# Patient Record
Sex: Female | Born: 1950 | Race: White
Health system: Southern US, Community
[De-identification: ages and names within clinical notes are randomized; demographics above are authoritative.]

## PROBLEM LIST (undated history)

## (undated) DIAGNOSIS — E079 Disorder of thyroid, unspecified: Secondary | ICD-10-CM

## (undated) DIAGNOSIS — J45909 Unspecified asthma, uncomplicated: Secondary | ICD-10-CM

## (undated) DIAGNOSIS — E785 Hyperlipidemia, unspecified: Secondary | ICD-10-CM

## (undated) DIAGNOSIS — N200 Calculus of kidney: Secondary | ICD-10-CM

## (undated) DIAGNOSIS — G629 Polyneuropathy, unspecified: Secondary | ICD-10-CM

## (undated) DIAGNOSIS — I10 Essential (primary) hypertension: Secondary | ICD-10-CM

## (undated) DIAGNOSIS — M799 Soft tissue disorder, unspecified: Secondary | ICD-10-CM

## (undated) DIAGNOSIS — F40240 Claustrophobia: Secondary | ICD-10-CM

## (undated) DIAGNOSIS — H9190 Unspecified hearing loss, unspecified ear: Secondary | ICD-10-CM

## (undated) DIAGNOSIS — D649 Anemia, unspecified: Secondary | ICD-10-CM

## (undated) DIAGNOSIS — H539 Unspecified visual disturbance: Secondary | ICD-10-CM

## (undated) DIAGNOSIS — Z5189 Encounter for other specified aftercare: Secondary | ICD-10-CM

## (undated) DIAGNOSIS — H269 Unspecified cataract: Secondary | ICD-10-CM

## (undated) DIAGNOSIS — N289 Disorder of kidney and ureter, unspecified: Secondary | ICD-10-CM

## (undated) DIAGNOSIS — T7840XA Allergy, unspecified, initial encounter: Secondary | ICD-10-CM

## (undated) DIAGNOSIS — E039 Hypothyroidism, unspecified: Secondary | ICD-10-CM

## (undated) DIAGNOSIS — K219 Gastro-esophageal reflux disease without esophagitis: Secondary | ICD-10-CM

## (undated) DIAGNOSIS — R011 Cardiac murmur, unspecified: Secondary | ICD-10-CM

## (undated) HISTORY — PX: TONSILLECTOMY: SUR1361

## (undated) HISTORY — DX: Unspecified hearing loss, unspecified ear: H91.90

## (undated) HISTORY — DX: Allergy, unspecified, initial encounter: T78.40XA

## (undated) HISTORY — DX: Calculus of kidney: N20.0

## (undated) HISTORY — PX: EYE SURGERY: SHX253

## (undated) HISTORY — PX: DILATION AND CURETTAGE OF UTERUS: SHX78

## (undated) HISTORY — DX: Essential (primary) hypertension: I10

## (undated) HISTORY — DX: Hyperlipidemia, unspecified: E78.5

## (undated) HISTORY — PX: TONSILECTOMY, ADENOIDECTOMY, BILATERAL MYRINGOTOMY AND TUBES: SHX2538

## (undated) HISTORY — DX: Disorder of thyroid, unspecified: E07.9

## (undated) HISTORY — DX: Anemia, unspecified: D64.9

---

## 2001-11-06 ENCOUNTER — Ambulatory Visit: Admission: RE | Admit: 2001-11-06 | Payer: Self-pay | Source: Ambulatory Visit | Admitting: Gastroenterology

## 2003-03-06 ENCOUNTER — Ambulatory Visit
Admit: 2003-03-06 | Disposition: A | Discharge status: Home / Self Care | Payer: Self-pay | Source: Ambulatory Visit | Admitting: Otolaryngology

## 2006-05-09 ENCOUNTER — Ambulatory Visit
Admit: 2006-05-09 | Disposition: A | Discharge status: Home / Self Care | Payer: Self-pay | Source: Ambulatory Visit | Admitting: Family Medicine

## 2007-07-11 ENCOUNTER — Ambulatory Visit: Admission: RE | Admit: 2007-07-11 | Payer: Self-pay | Source: Ambulatory Visit | Admitting: Urology

## 2007-07-11 LAB — POTASSIUM WHOLE BLOOD: Whole Blood Potassium: 4.2 mEQ/L (ref 3.6–5.0)

## 2007-08-08 HISTORY — PX: COLONOSCOPY, DIAGNOSTIC (SCREENING): SHX174

## 2008-11-16 HISTORY — PX: CATARACT EXTRACTION: SUR2

## 2009-11-03 ENCOUNTER — Emergency Department: Admit: 2009-11-03 | Payer: Self-pay | Source: Emergency Department | Admitting: Emergency Medicine

## 2011-06-28 LAB — ECG 12-LEAD
Atrial Rate: 73 {beats}/min
P Axis: 47 degrees
P-R Interval: 144 ms
Q-T Interval: 380 ms
QRS Duration: 74 ms
QTC Calculation (Bezet): 418 ms
R Axis: 12 degrees
T Axis: 39 degrees
Ventricular Rate: 73 {beats}/min

## 2011-07-06 NOTE — Op Note (Unsigned)
ROOM NUMBER:                           OPS OPS [id number]            SURGEON:                                Dolphus Jenny, MD            DATE:                                   07/11/2007                  PREOPERATIVE DIAGNOSIS:  Right ureteral stone.            POSTOPERATIVE DIAGNOSIS:  Probable passed ureteral stone.            OPERATION:  Cystoscopy, fluoroscopy, right ureteroscopy, stone extraction.            ANESTHESIA:            COMPLICATIONS:  None.            LINES, DRAINS:  None.            INDICATIONS FOR PROCEDURE:  The patient was a 60 year old white female with      a 4-mm stone that started in the mid right ureter and has not progressed.      She has been consented for right ureteroscopic stone extraction.  A signed      informed consent is on the chart.            PROCEDURE:  Patient was taken to the cystoscopy suite at [facility]      on Thursday, 07/11/2007.  Once anesthetized with general LMA and in a      lithotomy position, cystoscopy was performed which revealed essentially a      normal bladder.  A guidewire was passed up into the renal pelvis.  The      semirigid ACMI semirigid scope was passed up, in fact all the way up to the      UP junction, and there was just evidence of small fragments which were      grasped.            A stent was deemed not necessary.  She was extubated in cystoscopy suite,      given 3 days of Macrobid, and I will see her again as necessary.                                    ______________________________     Date Signed: __________      Dolphus Jenny, MD            SWF:[id number]      Dictated:    07/11/2007 12:56 P      Transcribed: 07/11/2007  5:35 P      Job Number: [id number]      Document Number: [id number]            CC:  Dolphus Jenny, MD

## 2011-12-14 DIAGNOSIS — I10 Essential (primary) hypertension: Secondary | ICD-10-CM | POA: Insufficient documentation

## 2011-12-17 DIAGNOSIS — K2270 Barrett's esophagus without dysplasia: Secondary | ICD-10-CM | POA: Insufficient documentation

## 2012-05-15 DIAGNOSIS — N200 Calculus of kidney: Secondary | ICD-10-CM | POA: Insufficient documentation

## 2013-01-15 ENCOUNTER — Other Ambulatory Visit: Payer: Self-pay

## 2013-01-15 DIAGNOSIS — Z1231 Encounter for screening mammogram for malignant neoplasm of breast: Secondary | ICD-10-CM

## 2013-01-31 ENCOUNTER — Ambulatory Visit
Admission: RE | Admit: 2013-01-31 | Discharge: 2013-01-31 | Disposition: A | Discharge status: Home / Self Care | Payer: Federal, State, Local not specified - PPO | Source: Ambulatory Visit

## 2013-01-31 DIAGNOSIS — Z1231 Encounter for screening mammogram for malignant neoplasm of breast: Secondary | ICD-10-CM

## 2013-12-25 DIAGNOSIS — E785 Hyperlipidemia, unspecified: Secondary | ICD-10-CM | POA: Insufficient documentation

## 2014-02-04 ENCOUNTER — Other Ambulatory Visit: Payer: Self-pay

## 2014-02-04 DIAGNOSIS — Z1231 Encounter for screening mammogram for malignant neoplasm of breast: Secondary | ICD-10-CM

## 2014-02-20 ENCOUNTER — Encounter (INDEPENDENT_AMBULATORY_CARE_PROVIDER_SITE_OTHER): Payer: Self-pay

## 2014-02-20 ENCOUNTER — Ambulatory Visit
Admission: RE | Admit: 2014-02-20 | Discharge: 2014-02-20 | Disposition: A | Discharge status: Home / Self Care | Payer: Federal, State, Local not specified - PPO | Source: Ambulatory Visit

## 2014-02-20 DIAGNOSIS — Z1231 Encounter for screening mammogram for malignant neoplasm of breast: Secondary | ICD-10-CM

## 2014-02-24 ENCOUNTER — Other Ambulatory Visit: Payer: Self-pay | Admitting: Family Medicine

## 2014-02-24 DIAGNOSIS — R928 Other abnormal and inconclusive findings on diagnostic imaging of breast: Secondary | ICD-10-CM

## 2014-02-25 ENCOUNTER — Ambulatory Visit
Admission: RE | Admit: 2014-02-25 | Discharge: 2014-02-25 | Disposition: A | Discharge status: Home / Self Care | Payer: Federal, State, Local not specified - PPO | Source: Ambulatory Visit | Attending: Family Medicine | Admitting: Family Medicine

## 2014-02-25 ENCOUNTER — Other Ambulatory Visit: Payer: Self-pay | Admitting: Family Medicine

## 2014-02-25 DIAGNOSIS — R928 Other abnormal and inconclusive findings on diagnostic imaging of breast: Secondary | ICD-10-CM

## 2014-04-16 DIAGNOSIS — R7301 Impaired fasting glucose: Secondary | ICD-10-CM | POA: Insufficient documentation

## 2014-04-17 DIAGNOSIS — D34 Benign neoplasm of thyroid gland: Secondary | ICD-10-CM | POA: Insufficient documentation

## 2014-04-17 DIAGNOSIS — N1831 Chronic kidney disease, stage 3a: Secondary | ICD-10-CM | POA: Insufficient documentation

## 2014-04-17 DIAGNOSIS — E039 Hypothyroidism, unspecified: Secondary | ICD-10-CM | POA: Insufficient documentation

## 2014-07-24 ENCOUNTER — Other Ambulatory Visit: Payer: Self-pay | Admitting: Family Medicine

## 2014-07-24 DIAGNOSIS — N63 Unspecified lump in unspecified breast: Secondary | ICD-10-CM

## 2014-08-27 ENCOUNTER — Ambulatory Visit
Admission: RE | Admit: 2014-08-27 | Discharge: 2014-08-27 | Disposition: A | Discharge status: Home / Self Care | Payer: Federal, State, Local not specified - PPO | Source: Ambulatory Visit | Attending: Family Medicine | Admitting: Family Medicine

## 2014-08-27 DIAGNOSIS — N63 Unspecified lump in unspecified breast: Secondary | ICD-10-CM

## 2015-01-12 ENCOUNTER — Encounter (INDEPENDENT_AMBULATORY_CARE_PROVIDER_SITE_OTHER): Payer: Self-pay

## 2015-01-14 ENCOUNTER — Ambulatory Visit (INDEPENDENT_AMBULATORY_CARE_PROVIDER_SITE_OTHER): Payer: BLUE CROSS/BLUE SHIELD | Admitting: Cardiovascular Disease

## 2015-01-14 DIAGNOSIS — I371 Nonrheumatic pulmonary valve insufficiency: Secondary | ICD-10-CM

## 2015-01-14 DIAGNOSIS — I340 Nonrheumatic mitral (valve) insufficiency: Secondary | ICD-10-CM

## 2015-01-14 DIAGNOSIS — I358 Other nonrheumatic aortic valve disorders: Secondary | ICD-10-CM

## 2015-01-14 NOTE — Procedures (Signed)
ECHOCARDIOGRAM REPORT    Name: Victoria Fields Age: 64 y.o. Gender: female Date: 01/14/2015   Clinical history: Mitral Regurgitation Height:  70in Weight: 200 lb   Physician: Jonny Ruiz T. Alois Cliche, M.D. cc: Nobu, Melton Alar, MD   Quality of study: Good Tech: LM BP 138/78 DVD Number: Dr Carolann Littler #[id number]     M-MODE AND 2-D DIMENSIONS  Patient values  Normal Values  Patient Values  Normal Values   Aortic Root: 30 mm 20-37 mm RVIDd 26 mm <59mm   Aortic Leaflet Separation: 15 mm 15-26 mm IVSDd 12 mm 6-11 mm   Left Atrium: 36 mm 18-40 mm LVPWDd 12 mm 6-11 mm   IVC  mm 22mm LVIDd 41 mm 37-56 mm       LVIDs 25 mm 20-40 mm   TDI 5.7  <10 FS% 39 % 28-45%   Ascending  Aorta: 30 mm <37 mm EF%  65  % 50-75%     DOPPLER/HEMODYNAMIC ANALYSIS  MV Inflow:   normal Decel Time:     Estimated RVSP: 20mm Hg TR Vmax 2.70m/sec RAP estim: 5 mm Hg     Valve Pk Vel. Normal (m/sec) Regurgitation Area (cm2) Mn Grad. Pk Grad. LVOT   Aortic  1.9 1-1.7  none  1.4  8.4  15.3     Mitral  0.9 0.6-1.3  none           Tricuspid  0.7 0.3-0.7  trace           Pulmonic  1.2 0.6-1.0  mild           LVOT  0.9 0.7-1.7                 FINAL IMPRESSIONS:  1. Left Ventricle: Normal left ventricular size, borderline hypertrophy and function with an EF of 60-65% with no regional wall motion abnormalities.  2. Right Ventricle: Normal size and function.   3. Pulmonary Arteries: Normal pulmonary artery pressures with an estimated RVSP of 20 mm  Hg.  4. Left Atrium: normal size   5. Right Atrium: normal size  6. Aortic Valve: tr.  There is mild sclerosis without stenosis.  No regurgitation  7. Mitral Valve: normal structure and function with no stenosis or regurgitation. flow velocities are normal   8. Tricuspid Valve: normal in morphology with trace regurgitation.  9. Pulmonic Valve:  mildI by Doppler.  10. Pericardium: no pericardial effusion.   11. IVC: normal size. Normal respiratory changes.  12. Aorta: normal aortic root size, aortic arch normal size.  13. Diastolic  Function: Tissue Doppler velocities and mitral inflow pattern  With no diastolic dysfunction.  .  Conclusions:  Mild left ventricular hypertrophy with preserved systolic function.  Trace tricuspid regurgitation.    Mild pulmonic insufficiency.  No other significant abnormalities noted  John T. Alois Cliche, M.D., [facility]

## 2015-02-17 HISTORY — PX: COLONOSCOPY, DIAGNOSTIC (SCREENING): SHX174

## 2015-02-22 ENCOUNTER — Encounter (INDEPENDENT_AMBULATORY_CARE_PROVIDER_SITE_OTHER): Payer: Self-pay

## 2015-02-22 ENCOUNTER — Ambulatory Visit (INDEPENDENT_AMBULATORY_CARE_PROVIDER_SITE_OTHER): Payer: BLUE CROSS/BLUE SHIELD | Admitting: Cardiovascular Disease

## 2015-02-22 DIAGNOSIS — I10 Essential (primary) hypertension: Secondary | ICD-10-CM

## 2015-02-22 NOTE — Procedures (Signed)
--------------------------------------------------------------------------------------------    Cardiology Outpatient Exercise Stress Test   --------------------------------------------------------------------------------------------    Name:  Victoria Fields, Victoria Fields       DOB: 05-02-1951        Test Date:  02/22/2015         Referring Physician: Phillips Odor, MD    Performing Physician: Delaney Meigs,  MD [facility]    Indications/symptoms:  Positive family history (MI)  Occasional pain in left arm when exercising  Risks for coronary artery disease      Current exercise routine:  Uses treadmill 1-3 times per week       Resting ECG: Normal sinus rhythm, no significant ST changes.    Heart Rate:     Rest = 82bpm  Maximum = 150bpm (95% MPHR).    Blood Pressure:  Rest = 140/82  Maximum = 180/70    Patient Symptoms:  no significant symptoms    Standard Bruce protocol exercise time:  8:30 minutes    Exercise workload:  10.2 METS    Reason for ending:  achieved target heart rate and shortness of breath    Ischemic ST changes at peak exercise:  no    Arrhythmias: none    CONCLUSION:     1. Negative EKG response to exercise stress at heart rate achieved.  2. Normal blood pressure response to exercise   3. average exercise capacity for age and gender.  The patient has good exercise tolerance.  She had no discomfort during the stress test.  This puts the patient in a good prognostic category.  This is a negative stress test.  No further cardiac workup is indicated      Signed by: Delaney Meigs, MD [facility]

## 2015-03-08 ENCOUNTER — Other Ambulatory Visit: Payer: Self-pay

## 2015-03-08 DIAGNOSIS — Z1231 Encounter for screening mammogram for malignant neoplasm of breast: Secondary | ICD-10-CM

## 2015-04-08 ENCOUNTER — Ambulatory Visit
Admission: RE | Admit: 2015-04-08 | Discharge: 2015-04-08 | Disposition: A | Discharge status: Home / Self Care | Payer: Federal, State, Local not specified - PPO | Source: Ambulatory Visit

## 2015-04-08 DIAGNOSIS — Z1231 Encounter for screening mammogram for malignant neoplasm of breast: Secondary | ICD-10-CM

## 2016-04-06 ENCOUNTER — Other Ambulatory Visit: Payer: Self-pay | Admitting: Family Medicine

## 2016-04-06 DIAGNOSIS — Z1231 Encounter for screening mammogram for malignant neoplasm of breast: Secondary | ICD-10-CM

## 2016-05-08 ENCOUNTER — Ambulatory Visit
Admission: RE | Admit: 2016-05-08 | Discharge: 2016-05-08 | Disposition: A | Discharge status: Home / Self Care | Payer: Federal, State, Local not specified - PPO | Source: Ambulatory Visit | Attending: Family Medicine | Admitting: Family Medicine

## 2016-05-08 DIAGNOSIS — Z1231 Encounter for screening mammogram for malignant neoplasm of breast: Secondary | ICD-10-CM

## 2017-03-05 DIAGNOSIS — Z124 Encounter for screening for malignant neoplasm of cervix: Secondary | ICD-10-CM

## 2017-03-05 HISTORY — DX: Encounter for screening for malignant neoplasm of cervix: Z12.4

## 2017-05-16 ENCOUNTER — Other Ambulatory Visit: Payer: Self-pay | Admitting: Family Medicine

## 2017-05-16 DIAGNOSIS — Z1231 Encounter for screening mammogram for malignant neoplasm of breast: Secondary | ICD-10-CM

## 2017-05-16 DIAGNOSIS — M8580 Other specified disorders of bone density and structure, unspecified site: Secondary | ICD-10-CM

## 2017-06-13 ENCOUNTER — Ambulatory Visit
Admission: RE | Admit: 2017-06-13 | Discharge: 2017-06-13 | Disposition: A | Discharge status: Home / Self Care | Payer: Federal, State, Local not specified - PPO | Source: Ambulatory Visit | Attending: Family Medicine | Admitting: Family Medicine

## 2017-06-13 DIAGNOSIS — M8580 Other specified disorders of bone density and structure, unspecified site: Secondary | ICD-10-CM

## 2017-06-13 DIAGNOSIS — Z1231 Encounter for screening mammogram for malignant neoplasm of breast: Secondary | ICD-10-CM

## 2017-06-14 ENCOUNTER — Other Ambulatory Visit: Payer: Self-pay | Admitting: Family Medicine

## 2017-06-14 DIAGNOSIS — R928 Other abnormal and inconclusive findings on diagnostic imaging of breast: Secondary | ICD-10-CM

## 2017-06-20 ENCOUNTER — Ambulatory Visit
Admission: RE | Admit: 2017-06-20 | Discharge: 2017-06-20 | Disposition: A | Discharge status: Home / Self Care | Payer: Federal, State, Local not specified - PPO | Source: Ambulatory Visit | Attending: Family Medicine | Admitting: Family Medicine

## 2017-06-20 ENCOUNTER — Ambulatory Visit: Payer: Federal, State, Local not specified - PPO

## 2017-06-20 DIAGNOSIS — R928 Other abnormal and inconclusive findings on diagnostic imaging of breast: Secondary | ICD-10-CM

## 2017-10-01 ENCOUNTER — Other Ambulatory Visit (INDEPENDENT_AMBULATORY_CARE_PROVIDER_SITE_OTHER): Payer: Self-pay | Admitting: Family Medicine

## 2017-11-09 ENCOUNTER — Other Ambulatory Visit (INDEPENDENT_AMBULATORY_CARE_PROVIDER_SITE_OTHER): Payer: Self-pay | Admitting: Family Medicine

## 2018-01-07 ENCOUNTER — Other Ambulatory Visit (INDEPENDENT_AMBULATORY_CARE_PROVIDER_SITE_OTHER): Payer: Self-pay | Admitting: Family Medicine

## 2018-01-09 ENCOUNTER — Other Ambulatory Visit: Payer: Self-pay | Admitting: Family Medicine

## 2018-04-22 DIAGNOSIS — G4733 Obstructive sleep apnea (adult) (pediatric): Secondary | ICD-10-CM | POA: Insufficient documentation

## 2018-04-22 DIAGNOSIS — Q8781 Alport syndrome: Secondary | ICD-10-CM | POA: Insufficient documentation

## 2018-04-24 HISTORY — PX: UPPER GASTROINTESTINAL ENDOSCOPY: SHX188

## 2018-05-16 DIAGNOSIS — J309 Allergic rhinitis, unspecified: Secondary | ICD-10-CM | POA: Insufficient documentation

## 2018-05-21 ENCOUNTER — Other Ambulatory Visit: Payer: Self-pay | Admitting: Family Medicine

## 2018-05-21 DIAGNOSIS — Z1231 Encounter for screening mammogram for malignant neoplasm of breast: Secondary | ICD-10-CM

## 2018-07-04 ENCOUNTER — Ambulatory Visit
Admission: RE | Admit: 2018-07-04 | Discharge: 2018-07-04 | Disposition: A | Discharge status: Home / Self Care | Payer: Federal, State, Local not specified - PPO | Source: Ambulatory Visit | Attending: Family Medicine | Admitting: Family Medicine

## 2018-07-04 DIAGNOSIS — Z1231 Encounter for screening mammogram for malignant neoplasm of breast: Secondary | ICD-10-CM

## 2018-07-05 ENCOUNTER — Other Ambulatory Visit: Payer: Self-pay | Admitting: Family Medicine

## 2018-07-05 DIAGNOSIS — R928 Other abnormal and inconclusive findings on diagnostic imaging of breast: Secondary | ICD-10-CM

## 2018-07-11 ENCOUNTER — Ambulatory Visit: Payer: Federal, State, Local not specified - PPO

## 2018-07-11 ENCOUNTER — Ambulatory Visit
Admission: RE | Admit: 2018-07-11 | Discharge: 2018-07-11 | Disposition: A | Discharge status: Home / Self Care | Payer: Federal, State, Local not specified - PPO | Source: Ambulatory Visit | Attending: Family Medicine | Admitting: Family Medicine

## 2018-07-11 DIAGNOSIS — R928 Other abnormal and inconclusive findings on diagnostic imaging of breast: Secondary | ICD-10-CM

## 2018-09-09 DIAGNOSIS — E669 Obesity, unspecified: Secondary | ICD-10-CM | POA: Insufficient documentation

## 2018-09-09 DIAGNOSIS — M8580 Other specified disorders of bone density and structure, unspecified site: Secondary | ICD-10-CM | POA: Insufficient documentation

## 2018-10-31 ENCOUNTER — Other Ambulatory Visit (INDEPENDENT_AMBULATORY_CARE_PROVIDER_SITE_OTHER): Payer: Self-pay | Admitting: Family Medicine

## 2019-03-05 LAB — COMPREHENSIVE METABOLIC PANEL
ALT: 33 IU/L — ABNORMAL HIGH (ref 0–32)
AST (SGOT): 31 IU/L (ref 0–40)
Albumin/Globulin Ratio: 2.3 — ABNORMAL HIGH (ref 1.2–2.2)
Albumin: 4.5 g/dL (ref 3.6–4.8)
Alkaline Phosphatase: 88 IU/L (ref 39–117)
BUN / Creatinine Ratio: 15 (ref 12–28)
BUN: 24 mg/dL (ref 8–27)
Bilirubin, Total: 0.3 mg/dL (ref 0.0–1.2)
CO2: 16 mmol/L — ABNORMAL LOW (ref 20–29)
Calcium: 10.1 mg/dL (ref 8.7–10.3)
Chloride: 106 mmol/L (ref 96–106)
Creatinine: 1.65 mg/dL — ABNORMAL HIGH (ref 0.57–1.00)
EGFR: 32 mL/min/{1.73_m2} — ABNORMAL LOW (ref >59)
EGFR: 37 mL/min/{1.73_m2} — ABNORMAL LOW (ref >59)
Globulin, Total: 2 g/dL (ref 1.5–4.5)
Glucose: 101 mg/dL — ABNORMAL HIGH (ref 65–99)
Potassium: 5.1 mmol/L (ref 3.5–5.2)
Protein, Total: 6.5 g/dL (ref 6.0–8.5)
Sodium: 143 mmol/L (ref 134–144)

## 2019-03-05 LAB — CBC AND DIFFERENTIAL
Baso(Absolute): 0 10*3/uL (ref 0.0–0.2)
Basos: 1 %
Eos: 5 %
Eosinophils Absolute: 0.4 10*3/uL (ref 0.0–0.4)
Hematocrit: 36.9 % (ref 34.0–46.6)
Hemoglobin: 12.4 g/dL (ref 11.1–15.9)
Immature Granulocytes Absolute: 0 10*3/uL (ref 0.0–0.1)
Immature Granulocytes: 0 %
Lymphocytes Absolute: 2.9 10*3/uL (ref 0.7–3.1)
Lymphocytes: 35 %
MCH: 30 pg (ref 26.6–33.0)
MCHC: 33.6 g/dL (ref 31.5–35.7)
MCV: 89 fL (ref 79–97)
Monocytes Absolute: 0.8 10*3/uL (ref 0.1–0.9)
Monocytes: 9 %
Neutrophils Absolute: 4.2 10*3/uL (ref 1.4–7.0)
Neutrophils: 50 %
Platelets: 255 10*3/uL (ref 150–379)
RBC: 4.14 x10E6/uL (ref 3.77–5.28)
RDW: 14.1 % (ref 12.3–15.4)
WBC: 8.4 10*3/uL (ref 3.4–10.8)

## 2019-03-05 LAB — BASIC METABOLIC PANEL
BUN / Creatinine Ratio: 22 (ref 12–28)
BUN / Creatinine Ratio: 12 (ref 12–28)
BUN: 35 mg/dL — ABNORMAL HIGH (ref 8–27)
BUN: 21 mg/dL (ref 8–27)
CO2: 20 mmol/L (ref 20–29)
CO2: 21 mmol/L (ref 20–29)
Calcium: 10.1 mg/dL (ref 8.7–10.3)
Calcium: 9.6 mg/dL (ref 8.7–10.3)
Chloride: 106 mmol/L (ref 96–106)
Chloride: 103 mmol/L (ref 96–106)
Creatinine: 1.58 mg/dL — ABNORMAL HIGH (ref 0.57–1.00)
Creatinine: 1.77 mg/dL — ABNORMAL HIGH (ref 0.57–1.00)
EGFR: 34 mL/min/{1.73_m2} — ABNORMAL LOW (ref >59)
EGFR: 39 mL/min/{1.73_m2} — ABNORMAL LOW (ref >59)
EGFR: 30 mL/min/{1.73_m2} — ABNORMAL LOW (ref >59)
EGFR: 34 mL/min/{1.73_m2} — ABNORMAL LOW (ref >59)
Glucose: 100 mg/dL — ABNORMAL HIGH (ref 65–99)
Glucose: 107 mg/dL — ABNORMAL HIGH (ref 65–99)
Potassium: 5.4 mmol/L — ABNORMAL HIGH (ref 3.5–5.2)
Potassium: 5.6 mmol/L — ABNORMAL HIGH (ref 3.5–5.2)
Sodium: 141 mmol/L (ref 134–144)
Sodium: 140 mmol/L (ref 134–144)

## 2019-03-05 LAB — LIPID PANEL, WITHOUT TOTAL CHOLESTEROL/HDL RATIO, SERUM
Cholesterol: 185 mg/dL (ref 100–199)
HDL: 54 mg/dL (ref >39)
LDL Calculated: 101 mg/dL — ABNORMAL HIGH (ref 0–99)
Triglycerides: 148 mg/dL (ref 0–149)
VLDL Calculated: 30 mg/dL (ref 5–40)

## 2019-03-05 LAB — TSH: TSH: 1.75 u[IU]/mL (ref 0.450–4.500)

## 2019-03-05 LAB — HEMOGLOBIN A1C: Hemoglobin A1C: 5.9 % — ABNORMAL HIGH (ref 4.8–5.6)

## 2019-03-05 LAB — URIC ACID: Uric acid: 4.7 mg/dL (ref 2.5–7.1)

## 2019-04-23 LAB — LIPID PANEL, WITHOUT TOTAL CHOLESTEROL/HDL RATIO, SERUM
Cholesterol: 188 mg/dL (ref 100–199)
HDL: 46 mg/dL (ref >39)
LDL Calculated: 117 mg/dL — ABNORMAL HIGH (ref 0–99)
Triglycerides: 126 mg/dL (ref 0–149)
VLDL Calculated: 25 mg/dL (ref 5–40)

## 2019-04-23 LAB — VITAMIN D,25 OH,TOTAL: Vitamin D 25-Hydroxy: 41.7 ng/mL (ref 30.0–100.0)

## 2019-04-23 LAB — HEMOGLOBIN A1C: Hemoglobin A1C: 6.1 % — ABNORMAL HIGH (ref 4.8–5.6)

## 2019-04-23 LAB — TSH: TSH: 1.62 u[IU]/mL (ref 0.450–4.500)

## 2019-04-25 ENCOUNTER — Telehealth (INDEPENDENT_AMBULATORY_CARE_PROVIDER_SITE_OTHER): Payer: Self-pay | Admitting: Family Medicine

## 2019-04-25 DIAGNOSIS — I340 Nonrheumatic mitral (valve) insufficiency: Secondary | ICD-10-CM | POA: Insufficient documentation

## 2019-04-25 DIAGNOSIS — J45909 Unspecified asthma, uncomplicated: Secondary | ICD-10-CM | POA: Insufficient documentation

## 2019-04-25 DIAGNOSIS — E790 Hyperuricemia without signs of inflammatory arthritis and tophaceous disease: Secondary | ICD-10-CM | POA: Insufficient documentation

## 2019-04-25 NOTE — Progress Notes (Signed)
Subjective:      Patient ID: Victoria Fields is a 68 y.o. female     Chief Complaint   Patient presents with    Follow-up     chronic care f/u        She is seen for ch care recheck.  Was here last in 12/2018.  Divides her time between [facility] and [place], [place], where her daughter lives, and helps care for grandchild.    Has gained some weight.  Could not exercise as much and eat right with grandchildren's care.    HM  PAP with HPV was done in 02/2017.  Mammo 06/2018.  Colonoscopy 02/2015, 5 year recheck, polyps.  Immuniz: UTD  FH of colon, breast, ovary, skin CA: no.  FH of DM, heart d: brother passed away with MI in 11-22-15. He had PKD and was on dialysis. Daughter with Alport synd.    For ch care:  1)CKD 3, renal cyst  BUN/Creat 28/1.46 in 05/2016. eGFR 38, 36/1.73 in 11/21/2016.  27/1.39 in 12/2016.  21/1.77 in 09/2017, eGFR 30, K 5.6. Was instructed to cut Lisinopril to 20 mg.  24/1.65 in 11/21/2017, eGFR 32, K 5.1.  35/1.58 in 12/2017,  18/1.49 in 03/2018, egfr 37, K 5.1.  Creat 1.55 on 3/17,  28/1.5 now.  Abd/pelvic CT 2012 left renal cyst 5 cm.  Renal sono 03/2017, exophytic cyst x 2 largest at 8 cm.  Saw Dr. Tereso Newcomer 3/17, note reviewed. Visit 8/4. Brings in BW result for review.  CBC nl now.  24 hr urine pro 288, cr cl 49 in 03/2018.   Urine microalbumin ratio 1115 now.    2)Hyperuricemia, kidney stone  On Uloric 40  Uric acid: 4.7 in 12/2017, 3.9 in 03/2018.    3)Hypertension:  On Lisinopril 20 mg, Cartia XT 120 by Dr. Tereso Newcomer. Off Amlodipine.  Home BP: 126/68 avg.  EST in 02/2015 nl. Echo in 01/2015 showed mild LVH and tr tricuspid regurg and mild aortic valve sclerosis without stenosis    4)Hyperlipidemia, fatty liver  On Zetia 10, Simvastatin 60, Lovaza 2 gm.  Change to Crestor 10 caused joint pain.  Lipids: 188/126/46/117 in Nov 21, 2018, 185/148/54/101 in Nov 21, 2017, 214/212/47/125 in 2016/11/21.  CMP with ALT 33 in Nov 21, 2017 and 03/2018, now 49.    5)Hypothyroidism:  Currently taking 50 mcg of brand name Synthroid daily.  TSH in a good range  2018-11-21.  Sono 12/2017, nodular thyroid without change.    6)IFG  A1C 6.0 in 2016-11-21, 5.9 in 09/2017, 5.8 in 03/2018, 6.1 in 2018-11-21.    7)GERD, Barrett's since 2013  Back on Prevacid, off Nexium due to nausea., Pepcid PRN.  EGD 04/2018 Dr. Lilia Pro, no Barretts, but + esophagitis despite Prevacid.  Symptoms: none as long as she is on med and stays on diet.    8)OSA  mod non-positional OSA per home testing 01/2018. Was referred for titration study, but she can't keep CPAP on.    9)Exercise induced asthma, allergy  Xyzal, Flonase, Flovent PRN    10)Osteopenia  DEXA 05/2017, T score -1.7 fem neck. In 2011, T score -1.5  Vit D 41.7 in 21-Nov-2018.    11)BMI>30  Weight change: no.       The following sections were reviewed this encounter by the provider:   Tobacco   Allergies   Meds   Problems   Med Hx   Surg Hx   Fam Hx          Review of Systems  BP 138/74    Pulse 72    Temp 98.8 F (37.1 C)    Resp 16    Ht 1.778 m (5\' 10" )    Wt 96.5 kg (212 lb 12.8 oz)    BMI 30.53 kg/m     Objective:     Physical Exam  Constitutional:       General: She is not in acute distress.     Appearance: Normal appearance. She is not ill-appearing.   HENT:      Head: Normocephalic and atraumatic.      Nose: Nose normal.   Eyes:      Extraocular Movements: Extraocular movements intact.      Conjunctiva/sclera: Conjunctivae normal.   Pulmonary:      Effort: Pulmonary effort is normal. No respiratory distress.   Musculoskeletal: Normal range of motion.   Neurological:      General: No focal deficit present.      Mental Status: She is alert.      Gait: Gait normal.   Psychiatric:         Mood and Affect: Mood normal.         Behavior: Behavior normal.          Assessment:     1. Chronic renal insufficiency, stage III (moderate)    2. Alport syndrome-like hereditary nephritis    3. Urine test positive for microalbuminuria  - Hemoglobin A1C    4. Impaired fasting glucose  - Hemoglobin A1C    5. Hyperuricemia    6. Benign essential hypertension    7.  Other hyperlipidemia    8. Fatty liver    9. Other specified hypothyroidism    10. Barrett's esophagus without dysplasia    11. Osteopenia of multiple sites  - Dxa Bone Density Axial Skeleton; Future    12. Screening for malignant neoplasm of breast  - Mammo Digital Screening Bilateral W Cad; Future    13. Health care maintenance  - Pneumococcal polysaccharide vaccine 23-valent greater than or equal to 2yo subcutaneous/IM        Plan:     Please continue working on quality low fat low sugar diet as well as regular exercises.    Continue current dose of BP medication and monitor BP at home.  Desirable range of BP is 120/70-135/85  Please call if consistently out of range.    Mammogram and DEXA due after October.    Plan recheck office visit in 4 months.      Victoria Lofton Paschal Dopp, MD

## 2019-04-25 NOTE — Telephone Encounter (Signed)
Please request most recent note and lab results from Dr. Ethlyn Daniels (Nephrology) office.

## 2019-04-27 ENCOUNTER — Encounter (INDEPENDENT_AMBULATORY_CARE_PROVIDER_SITE_OTHER): Payer: Self-pay

## 2019-04-28 ENCOUNTER — Encounter (INDEPENDENT_AMBULATORY_CARE_PROVIDER_SITE_OTHER): Payer: Self-pay | Admitting: Family Medicine

## 2019-04-28 ENCOUNTER — Ambulatory Visit (INDEPENDENT_AMBULATORY_CARE_PROVIDER_SITE_OTHER): Payer: BLUE CROSS/BLUE SHIELD | Admitting: Family Medicine

## 2019-04-28 VITALS — BP 138/74 | HR 72 | Temp 98.8°F | Resp 16 | Ht 70.0 in | Wt 212.8 lb

## 2019-04-28 DIAGNOSIS — K2270 Barrett's esophagus without dysplasia: Secondary | ICD-10-CM

## 2019-04-28 DIAGNOSIS — I10 Essential (primary) hypertension: Secondary | ICD-10-CM

## 2019-04-28 DIAGNOSIS — K760 Fatty (change of) liver, not elsewhere classified: Secondary | ICD-10-CM | POA: Insufficient documentation

## 2019-04-28 DIAGNOSIS — N183 Chronic kidney disease, stage 3 unspecified: Secondary | ICD-10-CM

## 2019-04-28 DIAGNOSIS — Q8781 Alport syndrome: Secondary | ICD-10-CM

## 2019-04-28 DIAGNOSIS — R809 Proteinuria, unspecified: Secondary | ICD-10-CM

## 2019-04-28 DIAGNOSIS — R7301 Impaired fasting glucose: Secondary | ICD-10-CM

## 2019-04-28 DIAGNOSIS — E7849 Other hyperlipidemia: Secondary | ICD-10-CM

## 2019-04-28 DIAGNOSIS — Z0000 Encounter for general adult medical examination without abnormal findings: Secondary | ICD-10-CM | POA: Insufficient documentation

## 2019-04-28 DIAGNOSIS — E038 Other specified hypothyroidism: Secondary | ICD-10-CM

## 2019-04-28 DIAGNOSIS — Z1239 Encounter for other screening for malignant neoplasm of breast: Secondary | ICD-10-CM | POA: Insufficient documentation

## 2019-04-28 DIAGNOSIS — Z23 Encounter for immunization: Secondary | ICD-10-CM

## 2019-04-28 DIAGNOSIS — M8589 Other specified disorders of bone density and structure, multiple sites: Secondary | ICD-10-CM

## 2019-04-28 DIAGNOSIS — E790 Hyperuricemia without signs of inflammatory arthritis and tophaceous disease: Secondary | ICD-10-CM

## 2019-04-28 NOTE — Telephone Encounter (Signed)
Requested.

## 2019-04-28 NOTE — Patient Instructions (Signed)
Please continue working on quality low fat low sugar diet as well as regular exercises.    Continue current dose of BP medication and monitor BP at home.  Desirable range of BP is 120/70-135/85  Please call if consistently out of range.    Mammogram and DEXA due after October.    Plan recheck office visit in 4 months.

## 2019-04-29 LAB — HEMOGLOBIN A1C: Hemoglobin A1C: 5.9 % — ABNORMAL HIGH (ref 4.8–5.6)

## 2019-04-29 NOTE — Progress Notes (Signed)
Diabetes risk is staying low at present.  Please continue your efforts as discussed.  See you back in November/December.

## 2019-05-05 ENCOUNTER — Telehealth (INDEPENDENT_AMBULATORY_CARE_PROVIDER_SITE_OTHER): Payer: Self-pay | Admitting: Family Medicine

## 2019-05-05 NOTE — Telephone Encounter (Signed)
Thank you :)

## 2019-05-05 NOTE — Telephone Encounter (Signed)
Placed in your box on Red.

## 2019-05-05 NOTE — Telephone Encounter (Signed)
Will you please request rec again?

## 2019-05-13 ENCOUNTER — Other Ambulatory Visit (INDEPENDENT_AMBULATORY_CARE_PROVIDER_SITE_OTHER): Payer: Self-pay | Admitting: Family Medicine

## 2019-05-16 ENCOUNTER — Other Ambulatory Visit (INDEPENDENT_AMBULATORY_CARE_PROVIDER_SITE_OTHER): Payer: Self-pay | Admitting: Family Medicine

## 2019-06-03 ENCOUNTER — Telehealth (INDEPENDENT_AMBULATORY_CARE_PROVIDER_SITE_OTHER): Payer: Self-pay

## 2019-06-03 NOTE — Telephone Encounter (Signed)
A PA has been initiated through CoverMyMeds Key: [id number]    The PA has BEEN SUBMITTED AND APPROVED

## 2019-06-23 ENCOUNTER — Encounter (INDEPENDENT_AMBULATORY_CARE_PROVIDER_SITE_OTHER): Payer: Self-pay

## 2019-06-23 ENCOUNTER — Telehealth (INDEPENDENT_AMBULATORY_CARE_PROVIDER_SITE_OTHER): Payer: BLUE CROSS/BLUE SHIELD | Admitting: Family Medicine

## 2019-06-23 DIAGNOSIS — J0100 Acute maxillary sinusitis, unspecified: Secondary | ICD-10-CM

## 2019-06-23 MED ORDER — DOXYCYCLINE HYCLATE 100 MG PO CAPS
100.0000 mg | ORAL_CAPSULE | Freq: Two times a day (BID) | ORAL | 0 refills | Status: DC
Start: 2019-06-23 — End: 2022-09-06

## 2019-06-23 NOTE — Patient Instructions (Signed)
-   Recommend using humidifier in room at night or running hot water before bedtime for cough  - Can do warm salt-water gargles to help with sore throat  - Warm tea with a good amount of honey for sore throat  - Use neti pot twice daily to help with sinus congestion, followed by afrin, then flonase once per day. Do this twice a day. Use Afrin twice a day for 3-4 days then stop. Continue Flonase for 14 days. These are nasal steroids to reduce inflammation. For medication to be effective, it is important to spray into each nostril pointing to the outside of the nose, as though aiming toward you ears.  - Take a daily anti-histamine (Zyrtec, Allegra, or Claritin D) Can add Benadryl at night if symptoms continue or are worse at night to help with sleeping and symptoms.  - If fever continues past 5-7 days, return for re-evaluation

## 2019-06-23 NOTE — Progress Notes (Signed)
[facility]  [place] Office  An [place] Partner             Date of Virtual Visit: 06/23/2019 2:51 PM        Patient ID: Victoria Fields is a 68 y.o. female.  Physician: Virgilio Frees, MD       Telemedicine Eligibility:    State Location:  [x]  [place]  []  [place]  []  [place] []  [place]  []  Other:    Physical Location:  [x]  Home  []         []        []          []  Other:    Patient Identity Verification:  [x]  State Issued ID  []  Insurance Eligibility Check  []  Other: Known to provider    Verbal consent has been obtained from the patient to conduct this video visit encounter to minimize exposure to COVID-19. Patient was positively identified and was verified to be located in the state of [place] at the time of the visit.         Chief Complaint:    Chief Complaint   Patient presents with    Sinusitis    Nasal Congestion    sinus pressure    postnasal drip         HPI:    # Sinus congestion  - Symptoms started 10-14 days ago, felt it building up in her sinuses and now feels like she cannot clear our mucus.  - Reports runny nose, maxillary sinus pain and b/l ear pressure  - Has similar symptoms about this time of year despite taking xyzal and flonase  - Denies any fevers, chills ,muscle aches  - Had her flu shot about 1 week ago  - Nonsmoker  - Family with similar symptoms, but no known COVID exposures      Problem List:    Patient Active Problem List   Diagnosis    Allergic rhinitis    Barrett's esophagus    Alport syndrome-like hereditary nephritis    Benign essential hypertension    Benign neoplasm of thyroid gland    Chronic renal insufficiency, stage III (moderate)    Hyperlipidemia    Hyperuricemia    Hypothyroidism    Impaired fasting glucose    Kidney stone    Obesity with body mass index 30 or greater    Obstructive sleep apnea syndrome    Osteopenia    Reactive airway disease    Mitral valve regurgitation    Urine test positive for  microalbuminuria    Screening for malignant neoplasm of breast    Health care maintenance    Fatty liver         Current Meds:    Outpatient Medications Marked as Taking for the 06/23/19 encounter (Telemedicine Visit) with Virgilio Frees, MD   Medication Sig Dispense Refill    albuterol sulfate HFA (Ventolin HFA) 108 (90 Base) MCG/ACT inhaler 2 puffs every 4 (four) hours      Cholecalciferol (Vitamin D) 50 MCG (2000 UT) Cap Vitamin D3 50 mcg (2,000 unit) capsule   Take by oral route.      dilTIAZem (dilTIAZem CD) 120 MG 24 hr capsule diltiazem CD 120 mg capsule,extended release 24 hr      ezetimibe (ZETIA) 10 MG tablet Take 10 mg by mouth daily.      Febuxostat (ULORIC) 40 MG tablet febuxostat 40 mg tablet      fluticasone (FLONASE) 50 MCG/ACT nasal  spray fluticasone propionate 50 mcg/actuation nasal spray,suspension      fluticasone (Flovent HFA) 44 MCG/ACT inhaler Flovent HFA 44 mcg/actuation aerosol inhaler      lansoprazole (PREVACID) 30 MG capsule Take 30 mg by mouth daily.      levocetirizine (XYZAL) 5 MG tablet levocetirizine 5 mg tablet      lisinopril (ZESTRIL) 20 MG tablet lisinopril 20 mg tablet      omega-3 acid ethyl esters (LOVAZA) 1 g capsule TAKE 2 CAPSULES DAILY 180 capsule 1    simvastatin (ZOCOR) 40 MG tablet TAKE ONE AND ONE-HALF TABLET BY MOUTH EVERY EVENING 135 tablet 0    Synthroid 50 MCG tablet Take 50 mcg by mouth Once a day at 6:00am            Allergies:    Allergies   Allergen Reactions    Cephalosporins Anaphylaxis    Strawberry Extract Hives    Cefuroxime     Dust Mite Extract     Molds & Smuts     Niacin Itching    Pollen Extract     Tree Extract            Past Surgical History:    Past Surgical History:   Procedure Laterality Date    CATARACT EXTRACTION  11/16/2008    COLONOSCOPY  02/17/2015    COLONOSCOPY  08/08/2007    DILATION AND CURETTAGE OF UTERUS      TONSILECTOMY, ADENOIDECTOMY, BILATERAL MYRINGOTOMY AND TUBES      UPPER GASTROINTESTINAL  ENDOSCOPY  04/24/2018           Family History:    Family History   Problem Relation Age of Onset    Heart failure Mother     Osteoporosis Mother     Thyroid disease Mother     Hyperlipidemia Mother     Heart failure Father     Myocardial Infarction Father     Coronary artery disease Father     Stent Father     Hyperlipidemia Father     Myocardial Infarction Maternal Grandfather     Myocardial Infarction Paternal Grandfather 42    Thyroid disease Sister     Hyperlipidemia Sister     Hyperlipidemia Brother     Sudden death Brother         cardiac    Kidney disease Brother     Asthma Brother     Thyroid disease Daughter     Hyperlipidemia Daughter            Social History:    Social History     Tobacco Use    Smoking status: Never Smoker    Smokeless tobacco: Never Used   Substance Use Topics    Alcohol use: Yes     Frequency: Monthly or less     Drinks per session: 1 or 2     Binge frequency: Never     Comment: rare    Drug use: No         The following sections were reviewed this encounter by the provider:   Tobacco   Allergies   Meds   Problems   Med Hx   Surg Hx   Fam Hx              Vital Signs:    There were no vitals taken for this visit.         ROS:      No fever, headache, chest pain, SOB, abd pain,  or nausea/vomiting/diarrhea          Physical Exam:      GENERAL APPEARANCE: alert, in no acute distress, pleasant, well nourished.   HEAD: normal appearance  EYES: no discharge  EARS: normal hearing  PULM: Speaks in complete sentences. Normal air movement.  PSYCH: appropriate affect, appropriate mood, normal speech, normal attention          Assessment/Plan:    1. Acute non-recurrent maxillary sinusitis  - doxycycline (VIBRAMYCIN) 100 MG capsule; Take 1 capsule (100 mg total) by mouth 2 (two) times daily for 10 days  Dispense: 20 capsule; Refill: 0  - Continue flonase and xyzal in addition to neti pot          Follow-up:    Return if symptoms worsen or fail to improve.         Virgilio Frees, MD

## 2019-06-23 NOTE — Progress Notes (Signed)
Pt is alone in room  Pt is in state of [place]  Verified Pt State ID

## 2019-06-25 NOTE — Progress Notes (Signed)
Chart read and reviewed.

## 2019-07-03 ENCOUNTER — Other Ambulatory Visit: Payer: Self-pay | Admitting: Family Medicine

## 2019-07-03 DIAGNOSIS — Z1231 Encounter for screening mammogram for malignant neoplasm of breast: Secondary | ICD-10-CM

## 2019-07-03 DIAGNOSIS — M8580 Other specified disorders of bone density and structure, unspecified site: Secondary | ICD-10-CM

## 2019-07-03 DIAGNOSIS — M810 Age-related osteoporosis without current pathological fracture: Secondary | ICD-10-CM

## 2019-07-23 ENCOUNTER — Other Ambulatory Visit (INDEPENDENT_AMBULATORY_CARE_PROVIDER_SITE_OTHER): Payer: Self-pay | Admitting: Family Medicine

## 2019-07-29 ENCOUNTER — Encounter (INDEPENDENT_AMBULATORY_CARE_PROVIDER_SITE_OTHER): Payer: Self-pay | Admitting: Family Medicine

## 2019-07-29 ENCOUNTER — Other Ambulatory Visit (INDEPENDENT_AMBULATORY_CARE_PROVIDER_SITE_OTHER): Payer: Self-pay | Admitting: Family Medicine

## 2019-07-29 DIAGNOSIS — I10 Essential (primary) hypertension: Secondary | ICD-10-CM

## 2019-07-29 DIAGNOSIS — E7849 Other hyperlipidemia: Secondary | ICD-10-CM

## 2019-07-29 DIAGNOSIS — D34 Benign neoplasm of thyroid gland: Secondary | ICD-10-CM

## 2019-07-29 MED ORDER — LISINOPRIL 20 MG PO TABS
ORAL_TABLET | ORAL | 1 refills | Status: DC
Start: 2019-07-29 — End: 2019-12-22

## 2019-07-29 MED ORDER — SIMVASTATIN 40 MG PO TABS
ORAL_TABLET | ORAL | 1 refills | Status: DC
Start: 2019-07-29 — End: 2019-12-22

## 2019-09-06 ENCOUNTER — Telehealth (INDEPENDENT_AMBULATORY_CARE_PROVIDER_SITE_OTHER): Payer: Self-pay | Admitting: Family Medicine

## 2019-09-06 NOTE — Telephone Encounter (Signed)
Please request mammogram and DEXA results from [facility], [phone]? [phone]? Not sure of phone number.

## 2019-09-06 NOTE — Progress Notes (Addendum)
Subjective:      Patient ID: Victoria Fields is a 68 y.o. female     Chief Complaint   Patient presents with    Annual Exam        She is seen for well visit and ch care recheck.  Was here last in 04/2019.  Divides her time between [facility] and [place], [place], where her daughter lives, and helps care for grandchild.  Has another daughter in [place].  She is due at the end of 12-03-22.    In the interim, was seen in 06/2019 for sinusitis, given Doxy.    Left knee lateral pain with weight bearing intermittently, though does ok with pilates.    A skin spot above the knee x 6 weeks.    Smoking: in college.  Alcohol: not much.  Diet: balanced  Exercise: regualrly  PAP with HPV was done in 02/2017.  Mammo 06/2018. Has apptmt in January.  Colonoscopy 02/2015, 5 year recheck, polyps, Dr. Selena Batten.  Immuniz: UTD  FH of colon, breast, ovary, skin CA: no.  FH of DM, heart d: brother passed away with MI in Dec 04, 2015. He had PKD and was on dialysis. Daughter with Alport synd.    For ch care:  1)CKD 3, renal cyst  BUN/Creat 28/1.46 in 05/2016. eGFR 38, 36/1.73 in 12-03-16.  27/1.39 in 12/2016.  21/1.77 in 09/2017, eGFR 30, K 5.6. Was instructed to cut Lisinopril to 20 mg.  24/1.65 in 12-03-17, eGFR 32, K 5.1.  35/1.58 in 12/2017,  18/1.49 in 03/2018, egfr 37, K 5.1.  Creat 1.55 on 3/17,  28/1.5 in 04/2019.  Abd/pelvic CT 2012 left renal cyst 5 cm.  Renal sono 03/2017, exophytic cyst x 2 largest at 8 cm.  Saw Dr. Tereso Newcomer 3/17, note reviewed. Visit 8/4. Brings in BW result for review.  CBC nl in 04/2019.  24 hr urine pro 288, cr cl 49 in 03/2018.   Urine microalbumin ratio 1115 in 04/2019.    2)Hyperuricemia, kidney stone  On Uloric 40  Uric acid: 4.7 in 12/2017, 3.9 in 03/2018.    3)Hypertension:  On Lisinopril 20 mg, Cartia XT 120 by Dr. Tereso Newcomer. Off Amlodipine.  Home BP:   EST in 02/2015 nl. Echo in 01/2015 showed mild LVH and tr tricuspid regurg and mild aortic valve sclerosis without stenosis    4)Hyperlipidemia, fatty liver  On Zetia 10, Simvastatin 60, Lovaza 2  gm.  Change to Crestor 10 caused joint pain.  Lipids: 188/126/46/117 in Dec 03, 2018, 185/148/54/101 in 12/03/17, 214/212/47/125 in 12-03-16.  CMP with ALT 33 in 2017-12-03 and 03/2018, 49 in 04/2019.    5)Hypothyroidism:  Currently taking 50 mcg of brand name Synthroid daily.  TSH in a good range 2018/12/03.  Sono 12/2017, nodular thyroid without change.    6)IFG  A1C 6.0 in 2016/12/03, 5.9 in 09/2017, 5.8 in 03/2018, 6.1 in 12/03/18, 5.9 in 04/2019.    7)GERD, Barrett's since 2013  Back on Prevacid, off Nexium due to nausea., Pepcid PRN.  EGD 04/2018 Dr. Lilia Pro, no Barretts, but + esophagitis despite Prevacid.  Had a visit 05/2019, plans on EGD/colonoscopy in 02/2020.  Symptoms: none as long as she is on med and stays on diet.    8)OSA  mod non-positional OSA per home testing 01/2018. Was referred for titration study, but she can't keep CPAP on.    9)Exercise induced asthma, allergy  Xyzal, Flonase, Flovent PRN    10)Osteopenia  DEXA T score -1.7 fem neck in 05/2017, -1.5 in 2011. Has  apptmt in January.  Vit D 41.7 in 10/2018.    11)BMI>30  Weight change:        The following sections were reviewed this encounter by the provider:   Tobacco   Allergies   Meds   Problems   Med Hx   Surg Hx   Fam Hx          Review of Systems   Constitutional: Negative for unexpected weight change.   Eyes: Negative for visual disturbance.   Respiratory: Negative for shortness of breath.    Cardiovascular: Negative for chest pain, palpitations and leg swelling.   Gastrointestinal: Negative for abdominal pain, constipation and diarrhea.   Genitourinary: Negative for dysuria and frequency.   Musculoskeletal: Negative for arthralgias and myalgias.          BP 136/64    Pulse 81    Temp 97.9 F (36.6 C) (Temporal)    Resp 16    Ht 1.778 m (5\' 10" )    Wt 96.6 kg (213 lb)    BMI 30.56 kg/m     Objective:     Physical Exam  Constitutional:       General: She is not in acute distress.     Appearance: Normal appearance. She is not ill-appearing.   HENT:      Head:  Normocephalic and atraumatic.      Nose: Nose normal.   Eyes:      Extraocular Movements: Extraocular movements intact.      Conjunctiva/sclera: Conjunctivae normal.   Pulmonary:      Effort: Pulmonary effort is normal. No respiratory distress.   Musculoskeletal: Normal range of motion.         General: No swelling or tenderness.      Right lower leg: No edema.      Left lower leg: No edema.   Skin:     Comments: Left lateral thigh, 1/2 cm dry erythematous lesion with warty appearance.   Neurological:      General: No focal deficit present.      Mental Status: She is alert.      Gait: Gait normal.   Psychiatric:         Mood and Affect: Mood normal.         Behavior: Behavior normal.          Assessment:     1. Well adult exam    2. Benign essential hypertension    3. Other hyperlipidemia  - Lipid panel    4. Impaired fasting glucose    5. Chronic renal impairment, stage 3a    6. Alport syndrome-like hereditary nephritis    7. Other specified hypothyroidism  - TSH    8. Mild intermittent reactive airway disease without complication    9. Barrett's esophagus without dysplasia    10. Hyperuricemia    11. Osteopenia of multiple sites    12. Knee strain, left, initial encounter    13. Other viral warts        Plan:     Please continue working on quality low fat low sugar diet as well as regular exercises.    Continue current dose of BP medication and monitor BP at home.  Desirable range of BP is 120/70-135/85  Please call if consistently out of range.    Mammogram and DEXA in January.    Please have this blood test done after 8 hours fasting in February.  You may drink water and take your medications.  Additional labs per Nephrology.    Wear knee support.    May apply Voltaren gel as needed in small amounts.    Set up apptmt with Derm for skin lesion evaluation.    Plan recheck office visit in April.      Aolani Piggott Paschal Dopp, MD

## 2019-09-08 NOTE — Telephone Encounter (Signed)
No results yet - I was told by [facility] imaging that pt has the exams scheduled to be done January 21st. Ph [phone]

## 2019-09-10 ENCOUNTER — Encounter (INDEPENDENT_AMBULATORY_CARE_PROVIDER_SITE_OTHER): Payer: Self-pay | Admitting: Family Medicine

## 2019-09-10 ENCOUNTER — Ambulatory Visit (INDEPENDENT_AMBULATORY_CARE_PROVIDER_SITE_OTHER): Payer: BLUE CROSS/BLUE SHIELD | Admitting: Family Medicine

## 2019-09-10 VITALS — BP 136/64 | HR 81 | Temp 97.9°F | Resp 16 | Ht 70.0 in | Wt 213.0 lb

## 2019-09-10 DIAGNOSIS — R7301 Impaired fasting glucose: Secondary | ICD-10-CM

## 2019-09-10 DIAGNOSIS — I10 Essential (primary) hypertension: Secondary | ICD-10-CM

## 2019-09-10 DIAGNOSIS — E7849 Other hyperlipidemia: Secondary | ICD-10-CM

## 2019-09-10 DIAGNOSIS — S86912A Strain of unspecified muscle(s) and tendon(s) at lower leg level, left leg, initial encounter: Secondary | ICD-10-CM

## 2019-09-10 DIAGNOSIS — Z0001 Encounter for general adult medical examination with abnormal findings: Secondary | ICD-10-CM

## 2019-09-10 DIAGNOSIS — K2270 Barrett's esophagus without dysplasia: Secondary | ICD-10-CM

## 2019-09-10 DIAGNOSIS — B078 Other viral warts: Secondary | ICD-10-CM

## 2019-09-10 DIAGNOSIS — N1831 Chronic kidney disease, stage 3a: Secondary | ICD-10-CM

## 2019-09-10 DIAGNOSIS — E790 Hyperuricemia without signs of inflammatory arthritis and tophaceous disease: Secondary | ICD-10-CM

## 2019-09-10 DIAGNOSIS — E038 Other specified hypothyroidism: Secondary | ICD-10-CM

## 2019-09-10 DIAGNOSIS — Z6830 Body mass index (BMI) 30.0-30.9, adult: Secondary | ICD-10-CM

## 2019-09-10 DIAGNOSIS — M8589 Other specified disorders of bone density and structure, multiple sites: Secondary | ICD-10-CM

## 2019-09-10 DIAGNOSIS — J4520 Mild intermittent asthma, uncomplicated: Secondary | ICD-10-CM

## 2019-09-10 DIAGNOSIS — Q8781 Alport syndrome: Secondary | ICD-10-CM

## 2019-09-10 DIAGNOSIS — Z0000 Encounter for general adult medical examination without abnormal findings: Secondary | ICD-10-CM

## 2019-09-10 NOTE — Addendum Note (Signed)
Addended bySkipper Cliche Y on: 09/10/2019 02:58 PM     Modules accepted: Level of Service

## 2019-09-10 NOTE — Patient Instructions (Signed)
Please continue working on quality low fat low sugar diet as well as regular exercises.    Continue current dose of BP medication and monitor BP at home.  Desirable range of BP is 120/70-135/85  Please call if consistently out of range.    Mammogram and DEXA in January.    Please have this blood test done after 8 hours fasting in February.  You may drink water and take your medications.  Additional labs per Nephrology.    Wear knee support.    May apply Voltaren gel as needed in small amounts.    Set up apptmt with Derm for skin lesion evaluation.    Plan recheck office visit in April.      Victoria Winski Paschal Dopp, MD

## 2019-09-19 DIAGNOSIS — Z789 Other specified health status: Secondary | ICD-10-CM

## 2019-09-19 HISTORY — DX: Other specified health status: Z78.9

## 2019-09-27 ENCOUNTER — Encounter (INDEPENDENT_AMBULATORY_CARE_PROVIDER_SITE_OTHER): Payer: Self-pay | Admitting: Family Medicine

## 2019-09-29 ENCOUNTER — Ambulatory Visit
Admission: RE | Admit: 2019-09-29 | Discharge: 2019-09-29 | Disposition: A | Discharge status: Home / Self Care | Payer: Federal, State, Local not specified - PPO | Source: Ambulatory Visit | Attending: Family Medicine | Admitting: Family Medicine

## 2019-09-29 ENCOUNTER — Other Ambulatory Visit: Payer: Self-pay

## 2019-09-29 ENCOUNTER — Other Ambulatory Visit (INDEPENDENT_AMBULATORY_CARE_PROVIDER_SITE_OTHER): Payer: Self-pay | Admitting: Family Medicine

## 2019-09-29 DIAGNOSIS — Z1231 Encounter for screening mammogram for malignant neoplasm of breast: Secondary | ICD-10-CM

## 2019-09-29 DIAGNOSIS — M8580 Other specified disorders of bone density and structure, unspecified site: Secondary | ICD-10-CM

## 2019-09-29 DIAGNOSIS — J3089 Other allergic rhinitis: Secondary | ICD-10-CM

## 2019-09-29 MED ORDER — LEVOCETIRIZINE DIHYDROCHLORIDE 5 MG PO TABS
5.0000 mg | ORAL_TABLET | Freq: Every evening | ORAL | 1 refills | Status: DC
Start: 2019-09-29 — End: 2019-12-22

## 2019-10-02 ENCOUNTER — Telehealth (INDEPENDENT_AMBULATORY_CARE_PROVIDER_SITE_OTHER): Payer: Self-pay | Admitting: Family Medicine

## 2019-10-02 NOTE — Telephone Encounter (Signed)
Please notify pt that I received mammogram and DEXA reports.  Mammogram was negative/normal.  She should continue with yearly screening mammogram.  DEXA scan showed same range Osteopenia without significant advancement to Osteoporosis.  She should continue regular weight bearing exercises.

## 2019-10-06 ENCOUNTER — Encounter (INDEPENDENT_AMBULATORY_CARE_PROVIDER_SITE_OTHER): Payer: Self-pay | Admitting: Family Medicine

## 2019-10-07 NOTE — Telephone Encounter (Signed)
Pt informed via mychart

## 2019-10-15 ENCOUNTER — Other Ambulatory Visit (INDEPENDENT_AMBULATORY_CARE_PROVIDER_SITE_OTHER): Payer: Self-pay | Admitting: Family Medicine

## 2019-10-15 LAB — LIPID PANEL
Cholesterol / HDL Ratio: 4.8 ratio — ABNORMAL HIGH (ref 0.0–4.4)
Cholesterol: 222 mg/dL — ABNORMAL HIGH (ref 100–199)
HDL: 46 mg/dL (ref >39)
LDL Chol Calculated (NIH): 140 mg/dL — ABNORMAL HIGH (ref 0–99)
Triglycerides: 202 mg/dL — ABNORMAL HIGH (ref 0–149)
VLDL Calculated: 36 mg/dL (ref 5–40)

## 2019-10-15 LAB — TSH: TSH: 1.95 u[IU]/mL (ref 0.450–4.500)

## 2019-10-15 NOTE — Progress Notes (Signed)
Thyroid is in a good range, but LDL cholesterol is up, as it was 117 last year. I'd rather not have to adjust medications.  Please continue working on diet and exercise.  Recheck blood test in 3-4 months.

## 2019-10-30 ENCOUNTER — Encounter (INDEPENDENT_AMBULATORY_CARE_PROVIDER_SITE_OTHER): Payer: Self-pay

## 2019-11-14 ENCOUNTER — Encounter (INDEPENDENT_AMBULATORY_CARE_PROVIDER_SITE_OTHER): Payer: Self-pay

## 2019-11-21 ENCOUNTER — Other Ambulatory Visit (INDEPENDENT_AMBULATORY_CARE_PROVIDER_SITE_OTHER): Payer: Self-pay | Admitting: Family Medicine

## 2019-12-14 NOTE — Progress Notes (Signed)
Subjective:      Patient ID: Victoria Fields is a 69 y.o. female     Chief Complaint   Patient presents with    Follow-up     chronic care        She is seen for ch care recheck.  Was here last in 08/2019.  -Divides her time helping caring for grandchildren in [place], [place], and [place]. The daughter in [place] had a difficult labor for her first baby at the end of February. She stayed to help over 4 weeks.   -While there, noted bleeding on her foot that she did not realize.  She had decreased sensation of her feet.  She had a Neurological eval per Dr. Sue Lush in the past >10 years ago.  -Hurt left shoulder while in [place] from carrying the baby.  Finds it difficult to raise left arm.  Had right shoulder rotator cuff injury years ago.  -Started taking Na Bicarb suppl per Dr. Tereso Newcomer.    HM  Smoking: in college.  Alcohol: not much.  Diet: balanced  Exercise: regualrly  PAP with HPV was done in 02/2017.  Mammo 09/2019.  Colonoscopy 02/2015, 5 year recheck, polyps, Dr. Selena Batten.  Immuniz: UTD  FH of colon, breast, ovary, skin CA: no.  FH of DM, heart d: brother passed away with MI in 11/21/15. He had PKD and was on dialysis. Daughter with Alport synd.    For ch care:  1)CKD 3, renal cyst  BUN/Creat 28/1.46 in 05/2016. eGFR 38, 36/1.73 in 20-Nov-2016.  27/1.39 in 12/2016.  21/1.77 in 09/2017, eGFR 30, K 5.6. Was instructed to cut Lisinopril to 20 mg.  24/1.65 in 11-20-2017, eGFR 32, K 5.1.  35/1.58 in 12/2017,  18/1.49 in 03/2018, eGFR 37, K 5.1.  Creat 1.55 on 3/17,  28/1.5 in 04/2019.  22/1.38 in 09/2019.  Abd/pelvic CT 2012 left renal cyst 5 cm.  Renal sono 03/2017, exophytic cyst x 2 largest at 8 cm.  24 hr urine pro 288, cr cl 49 in 03/2018.   Saw Dr. Tereso Newcomer 11-21-2019, note and BW result reviewed. Na Bicarb was added, since her CO2 was 17.  CBC nl in 09/2019.  Urine microalbumin ratio 1115 in 04/2019, 1459 in 09/2019.    2)Hyperuricemia, kidney stone  On Uloric 40  Uric acid: 4.7 in 12/2017, 3.9 in 03/2018.    3)Hypertension:  On Lisinopril 20 mg, Cartia XT  120 by Dr. Tereso Newcomer. Off Amlodipine.  Home BP:   EST in 02/2015 nl.   Echo in 01/2015 showed mild LVH and tr tricuspid regurg and mild aortic valve sclerosis without stenosis    4)Hyperlipidemia, fatty liver  On Zetia 10, Simvastatin 60, Lovaza 2 gm.  Change to Crestor 10 caused joint pain.  Lipids: 222/202/46/140 in 09/2019, 188/126/46/117 in 10/2018, 185/148/54/101 in November 20, 2017, 214/212/47/125 in 11/20/16.  CMP with ALT 33 in 2017/11/20 and 03/2018, 49 in 04/2019, 38 in 09/2019.    5)Hypothyroidism:  Currently taking 50 mcg of brand name Synthroid daily.  TSH in a good range 09/2019.  Sono 12/2017, nodular thyroid without change.    6)IFG  A1C 6.0 in 2016-11-20, 5.9 in 09/2017, 5.8 in 03/2018, 6.1 in 10/2018, 5.9 in 04/2019.    7)GERD, Barrett's since 2013  Back on Prevacid, off Nexium due to nausea., Pepcid PRN.  EGD 04/2018 Dr. Lilia Pro, no Barretts, but + esophagitis despite Prevacid.  Had a visit 05/2019, plans on EGD/colonoscopy in 02/2020.  Symptoms: none as long as she is on med  and stays on diet.    8)OSA  mod non-positional OSA per home testing 01/2018. Was referred for titration study, but she can't keep CPAP on.    9)Exercise induced asthma, allergy  Xyzal, Flonase, Flovent PRN    10)Osteopenia  DEXA T score -1.8 fem neck in 09/2019, -1.7 in 05/2017, -1.5 in 2011.  Vit D 41.7 in 10/2018.    11)BMI>30  Weight change: 10 pounds loss.       The following sections were reviewed this encounter by the provider:   Tobacco   Allergies   Meds   Problems   Med Hx   Surg Hx   Fam Hx          Review of Systems   Constitutional: Negative for unexpected weight change.   Eyes: Negative for visual disturbance.   Respiratory: Negative for shortness of breath.    Cardiovascular: Negative for chest pain, palpitations and leg swelling.   Gastrointestinal: Negative for abdominal pain, constipation and diarrhea.   Genitourinary: Negative for dysuria and frequency.   Musculoskeletal: Negative for arthralgias and myalgias.          BP 118/62    Pulse 72     Temp 99.3 F (37.4 C)    Resp 16    Ht 1.778 m (5\' 10" )    Wt 92.4 kg (203 lb 9.6 oz)    BMI 29.21 kg/m     Objective:     Physical Exam  Constitutional:       General: She is not in acute distress.     Appearance: Normal appearance. She is not ill-appearing.   HENT:      Head: Normocephalic and atraumatic.      Nose: Nose normal.   Eyes:      Extraocular Movements: Extraocular movements intact.      Conjunctiva/sclera: Conjunctivae normal.   Neck:      Thyroid: No thyroid mass or thyromegaly.   Cardiovascular:      Rate and Rhythm: Regular rhythm.      Heart sounds: Murmur present.   Pulmonary:      Effort: Pulmonary effort is normal. No respiratory distress.   Musculoskeletal:         General: No swelling or tenderness.      Right lower leg: No edema.      Left lower leg: No edema.      Comments: Left shoulder decreased ROM. Has pain with LUE rotation to the back.  Sore with resisted LUE abduction   Lymphadenopathy:      Cervical: No cervical adenopathy.   Skin:     Comments: Left lateral thigh, 1/2 cm dry erythematous lesion with warty appearance.   Neurological:      General: No focal deficit present.      Mental Status: She is alert.      Gait: Gait normal.   Psychiatric:         Mood and Affect: Mood normal.         Behavior: Behavior normal.          Assessment:     1. Other polyneuropathy  - Ambulatory referral to Neurology    2. Acute pain of left shoulder  - XR Shoulder Left 2+ Views    3. Chronic renal impairment, stage 3a    4. 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 Other hyperlipidemia  - Lipid panel  - omega-3 acid ethyl esters (LOVAZA) 1 g capsule; Take 2 capsules (2  g total) by mouth daily  Dispense: 180 capsule; Refill: 3  - simvastatin (ZOCOR) 40 MG tablet; TAKE ONE AND ONE-HALF TABLET BY MOUTH EVERY EVENING  Dispense: 135 tablet; Refill: 3    6. Benign essential hypertension  - lisinopril (ZESTRIL) 20 MG tablet; lisinopril 20 mg tablet one po qd  Dispense: 90 tablet; Refill: 3    7.  Nonrheumatic mitral valve regurgitation    8. Other specified hypothyroidism  - Synthroid 50 MCG tablet; Take 1 tablet (50 mcg total) by mouth Once a day at 6:00am  Dispense: 90 tablet; Refill: 3    9. Impaired fasting glucose  - Hemoglobin A1C    10. Barrett's esophagus without dysplasia    11. Osteopenia of multiple sites    12. Mild intermittent reactive airway disease without complication  - fluticasone (Flovent HFA) 44 MCG/ACT inhaler; USE 2 INHALATIONS ORALLY   EVERY 12 HOURS  Dispense: 31.8 g; Refill: 3    13. Non-seasonal allergic rhinitis due to other allergic trigger  - fluticasone (FLONASE) 50 MCG/ACT nasal spray; SHAKE WELL BEFORE EACH USE AND USE 2 SPRAYS IN EACH   NOSTRIL ONCE DAILY  Dispense: 48 g; Refill: 3  - levocetirizine (XYZAL) 5 MG tablet; Take 1 tablet (5 mg total) by mouth every evening  Dispense: 90 tablet; Refill: 3        Plan:     Please continue working on quality low fat low sugar diet as well as regular exercises.    Continue current dose of BP medication and monitor BP at home.  Desirable range of BP is 120/70-135/85  Please call if consistently out of range.    Please have this blood test done after 8 hours fasting in June/July when having labs done with Dr. Wenda Overland order.  You may drink water and take your medications.    Proceed with x-ray, then Sports medicine evaluation.    Also Neurology re-evaluation with likely nerve conduction study.    Review of outside records and visit today took place over 40 min.    Hawken Bielby Paschal Dopp, MD

## 2019-12-15 ENCOUNTER — Telehealth (INDEPENDENT_AMBULATORY_CARE_PROVIDER_SITE_OTHER): Payer: Self-pay | Admitting: Family Medicine

## 2019-12-15 NOTE — Telephone Encounter (Signed)
Please request lab result from Dr. Tereso Newcomer (Nephrology) office.

## 2019-12-15 NOTE — Telephone Encounter (Signed)
Handed to you

## 2019-12-22 ENCOUNTER — Ambulatory Visit (INDEPENDENT_AMBULATORY_CARE_PROVIDER_SITE_OTHER): Payer: BLUE CROSS/BLUE SHIELD | Admitting: Family Medicine

## 2019-12-22 ENCOUNTER — Encounter (INDEPENDENT_AMBULATORY_CARE_PROVIDER_SITE_OTHER): Payer: Self-pay | Admitting: Family Medicine

## 2019-12-22 VITALS — BP 118/62 | HR 72 | Temp 99.3°F | Resp 16 | Ht 70.0 in | Wt 203.6 lb

## 2019-12-22 DIAGNOSIS — J3089 Other allergic rhinitis: Secondary | ICD-10-CM

## 2019-12-22 DIAGNOSIS — G6289 Other specified polyneuropathies: Secondary | ICD-10-CM

## 2019-12-22 DIAGNOSIS — Q8781 Alport syndrome: Secondary | ICD-10-CM

## 2019-12-22 DIAGNOSIS — I340 Nonrheumatic mitral (valve) insufficiency: Secondary | ICD-10-CM

## 2019-12-22 DIAGNOSIS — E038 Other specified hypothyroidism: Secondary | ICD-10-CM

## 2019-12-22 DIAGNOSIS — R7301 Impaired fasting glucose: Secondary | ICD-10-CM

## 2019-12-22 DIAGNOSIS — M25512 Pain in left shoulder: Secondary | ICD-10-CM

## 2019-12-22 DIAGNOSIS — E7849 Other hyperlipidemia: Secondary | ICD-10-CM

## 2019-12-22 DIAGNOSIS — I10 Essential (primary) hypertension: Secondary | ICD-10-CM

## 2019-12-22 DIAGNOSIS — J4520 Mild intermittent asthma, uncomplicated: Secondary | ICD-10-CM

## 2019-12-22 DIAGNOSIS — K2270 Barrett's esophagus without dysplasia: Secondary | ICD-10-CM

## 2019-12-22 DIAGNOSIS — M8589 Other specified disorders of bone density and structure, multiple sites: Secondary | ICD-10-CM

## 2019-12-22 DIAGNOSIS — N1831 Chronic kidney disease, stage 3a: Secondary | ICD-10-CM

## 2019-12-22 MED ORDER — SYNTHROID 50 MCG PO TABS
50.0000 ug | ORAL_TABLET | Freq: Every day | ORAL | 3 refills | Status: DC
Start: 2019-12-22 — End: 2020-10-08

## 2019-12-22 MED ORDER — OMEGA-3-ACID ETHYL ESTERS 1 G PO CAPS
2.00 | ORAL_CAPSULE | Freq: Every day | ORAL | 3 refills | Status: DC
Start: 2019-12-22 — End: 2020-10-08

## 2019-12-22 MED ORDER — FLOVENT HFA 44 MCG/ACT IN AERO
INHALATION_SPRAY | RESPIRATORY_TRACT | 3 refills | Status: DC
Start: 2019-12-22 — End: 2021-01-23

## 2019-12-22 MED ORDER — LEVOCETIRIZINE DIHYDROCHLORIDE 5 MG PO TABS
5.0000 mg | ORAL_TABLET | Freq: Every evening | ORAL | 3 refills | Status: DC
Start: 2019-12-22 — End: 2020-09-21

## 2019-12-22 MED ORDER — SIMVASTATIN 40 MG PO TABS
ORAL_TABLET | ORAL | 3 refills | Status: DC
Start: 2019-12-22 — End: 2020-09-21

## 2019-12-22 MED ORDER — LISINOPRIL 20 MG PO TABS
ORAL_TABLET | ORAL | 3 refills | Status: DC
Start: 2019-12-22 — End: 2020-07-07

## 2019-12-22 MED ORDER — FLUTICASONE PROPIONATE 50 MCG/ACT NA SUSP
NASAL | 3 refills | Status: DC
Start: 2019-12-22 — End: 2021-01-23

## 2019-12-22 NOTE — Progress Notes (Signed)
Please discuss with Dr. Milana Kidney.

## 2019-12-22 NOTE — Patient Instructions (Signed)
Please continue working on quality low fat low sugar diet as well as regular exercises.    Continue current dose of BP medication and monitor BP at home.  Desirable range of BP is 120/70-135/85  Please call if consistently out of range.    Please have this blood test done after 8 hours fasting in June/July when having labs done with Dr. Wenda Overland order.  You may drink water and take your medications.    Proceed with x-ray, then Sports medicine evaluation.    Also Neurology re-evaluation with likely nerve conduction study.

## 2019-12-23 ENCOUNTER — Encounter (INDEPENDENT_AMBULATORY_CARE_PROVIDER_SITE_OTHER): Payer: Self-pay | Admitting: Family Medicine

## 2019-12-24 ENCOUNTER — Encounter (INDEPENDENT_AMBULATORY_CARE_PROVIDER_SITE_OTHER): Payer: Self-pay | Admitting: Family Medicine

## 2019-12-24 ENCOUNTER — Ambulatory Visit (INDEPENDENT_AMBULATORY_CARE_PROVIDER_SITE_OTHER): Payer: BLUE CROSS/BLUE SHIELD | Admitting: Family Medicine

## 2019-12-24 DIAGNOSIS — M7542 Impingement syndrome of left shoulder: Secondary | ICD-10-CM

## 2019-12-24 DIAGNOSIS — M7552 Bursitis of left shoulder: Secondary | ICD-10-CM

## 2019-12-24 MED ORDER — TRIAMCINOLONE ACETONIDE 40 MG/ML IJ SUSP
80.00 mg | Freq: Once | INTRAMUSCULAR | Status: AC
Start: 2019-12-24 — End: 2019-12-24
  Administered 2019-12-24: 13:00:00 80 mg

## 2019-12-24 NOTE — Progress Notes (Signed)
Subjective:      Patient ID: Victoria Fields is a 69 y.o. female     Chief Complaint   Patient presents with    Shoulder Pain     Left        HPI   Pt ambulated to room with no assistance and did not appear to be in distress  Denies Chest pain and trouble breathing  Location of pain: Left anterior shoulder   No acute DOI, painful for several months, worsening since 11/17/2019  No acute MOI, worsened after sleeping on uncomfortable bedding when staying with daughter after having a baby   Describes pain as "sharp"  Does not radiate   Worse with movement and reaching behind  XR by PCP on 12/22/2019  No Clicking or popping  No numbness or tingling  ROM decrease d/t pain   Has tried tylenol and advil for pain but very limited d/t personal hx of kidney disease   Has tried ice which provided temporary relief but nothing long term  Tried ibuprofen even though she has CKD and it did help some  Tylenol does not help  7/10 pain  Was using her pilates machine regularly but nothing out of the normal     The following sections were reviewed this encounter by the provider:   Tobacco   Allergies   Meds   Problems   Med Hx   Surg Hx   Fam Hx          Review of Systems   Constitutional: Negative for chills and fever.   Respiratory: Negative for cough and shortness of breath.    Cardiovascular: Negative for chest pain.   Musculoskeletal: Positive for arthralgias.   Neurological: Negative for numbness.          There were no vitals taken for this visit.    Objective:     Physical Exam  Vitals reviewed.   Constitutional:       General: She is not in acute distress.     Appearance: Normal appearance. She is not ill-appearing.   HENT:      Head: Normocephalic and atraumatic.      Right Ear: External ear normal.      Left Ear: External ear normal.   Eyes:      Extraocular Movements: Extraocular movements intact.   Pulmonary:      Comments: No cough, wheeze, or shortness of breath noted during exam.  Patient able to complete full  sentences.  Musculoskeletal:         General: No tenderness.      Comments: No tenderness palpation of the AC joint or posterior musculature.  Range of motion intact though end range of motion causes some pain.  Negative empty can, mild tenderness with resisted external rotation, no pain with internal rotation resistance, positive Hawkins testing, negative Jobs testing,   Skin:     Coloration: Skin is not jaundiced or pale.   Neurological:      General: No focal deficit present.      Mental Status: She is alert. Mental status is at baseline.   Psychiatric:         Mood and Affect: Mood normal.         Thought Content: Thought content normal.        L Shoulder injection procedure  Attending: Rolin Barry, MD  Confirmed: Correct patient, procedure, and site.   Informed consent: given by patient.       Injection Location: L  shoulder, Posterior approach    Preparation and technique:  Prepped with chlorhexidine.  Pain ease topical spray applied prior to injection.     The needle was inserted 2-3cm inferior and medial to the posterolateral corner of the acromion and directed anteriorly towards the caracoid process. No blood aspirated. Joint injected with Kenalog 2 cc and Lidocaine 1% 3 into the subacromial space. Medication flowed easily.   Procedure tolerated: well.    Complications: None   Assessment:     1. Acute bursitis of left shoulder  - Referral to Physical Therapy - EXTERNAL  - triamcinolone acetonide (KENALOG-40) 40 MG/ML injection 80 mg  - Arthrocentesis    2. Impingement syndrome of left shoulder  - triamcinolone acetonide (KENALOG-40) 40 MG/ML injection 80 mg  - Arthrocentesis        Plan:   Went over options with patient for subacromial bursitis versus impingement syndrome.  Performed subacromial shoulder injection on the left side.  Patient experienced 50 to 60% relief immediately after shoulder injection.  We will have her follow-up with physical therapy to continue working on strengthening the lower  muscles of the rotator cuff complex to offload the impingement/bursitis.  Also provided patient with sleeper stretch instructions.  We will have her follow-up sooner for any worsening or concerning symptoms but otherwise follow-up in 4 weeks    [name], MDPt is here today for   Chief Complaint   Patient presents with    Shoulder Pain     Left         Rolin Barry, MD            *This note was generated by the Epic EMR system/ Dragon speech recognition and may contain inherent errors or omissions not intended by the user. Grammatical errors, random word insertions, deletions, pronoun errors and incomplete sentences are occasional consequences of this technology due to software limitations. Not all errors are caught or corrected. If there are questions or concerns about the content of this note or information contained within the body of this dictation they should be addressed directly with the author for clarification.

## 2020-01-02 ENCOUNTER — Encounter (INDEPENDENT_AMBULATORY_CARE_PROVIDER_SITE_OTHER): Payer: Self-pay

## 2020-01-02 ENCOUNTER — Telehealth (INDEPENDENT_AMBULATORY_CARE_PROVIDER_SITE_OTHER): Payer: Self-pay | Admitting: Family Medicine

## 2020-01-02 NOTE — Telephone Encounter (Signed)
FEP-no referral required-faxed Dr. Larina Bras 01/02/20

## 2020-01-06 ENCOUNTER — Ambulatory Visit (INDEPENDENT_AMBULATORY_CARE_PROVIDER_SITE_OTHER): Payer: BLUE CROSS/BLUE SHIELD | Admitting: Physical Therapist

## 2020-01-06 DIAGNOSIS — M25512 Pain in left shoulder: Secondary | ICD-10-CM

## 2020-01-06 NOTE — Progress Notes (Signed)
Name:Victoria Fields Age: 69 y.o.   Date of Service: 01/06/2020  Referring Physician: Phillips Odor, MD   Date of Injury: No data was found  Date Care Plan Established/Reviewed: No data was found  Date Treatment Started: No data was found  End of Certification Date: No data was found  Sessions in Plan of Care: No data was found  Surgery Date: No data was found      Visit Count: 1   Diagnosis: No diagnosis found.    Subjective     History of Present Illness   History of Present Illness: S:     LEVEL:  1/10 L shoulder, 5/10 at worst      Precautions: No data was found  Allergies: Cephalosporins, Strawberry extract, Cefuroxime, Dust mite extract, Molds & smuts, Niacin, Pollen extract, and Tree extract    Past Medical History:   Diagnosis Date    Allergies     Anemia     exercise induced    Hearing disorder     Hyperlipidemia     Hypertension     Kidney stones     Pap smear for cervical cancer screening 03/05/2017    Thyroid disease        Objective   L Shoulder Evaluation 01/06/2020  MOI: unknown    DOI: Upper arm with exercise the past 3-4 mo. March 1st 2021 sleeping on futon and helping daughter with newborn.     C/O: L upper arm pain    R hand dominant    Decrease: Ice, injection,    PLOF: pilates reformer    PMHx: chronic kidney disease    Diagnostics: xray 12/22/2019  1. No acute fracture or dislocation.  2.  Probable rotator cuff tendinopathy.     SPADI: 59/130    AROM BUEs  Flex R tight end-range, L 141  Abd R tight end-range, L 101  IR R T10, L T12  ER R 102, L 59 (supine at 90abd)    MMT BUEs  Flex R 4/5, L 2/5  Abd R 4/5, L 2/5  IR R 4/5, L 2/5  ER R 4/5, L 2/5  Post Delt 4/5  Mid Trap 3/5  Lower Trap R 3/5, L unable to assess    Special Tests:  Neers (+)  Hawkins-Kennedy (+)  Speeds (+) pain    Palpation: (+) UT, teres major/minor, pectorals    HEP: table slides (flex, abd, ER), strap IR , scap squeezes, UT stretches                  Treatment     Therapeutic Exercises - Justified to address any of the  following: develop strength, endurance, ROM and/or flexibility.   HEP    Manual Therapy - Justified to address any of the following:  Mobilization of joints and soft tissues, manipulation, manual lymphatic drainage, and/or manual traction.    Supine: STM to DTM and Tpr L UT, pectorals,  Manual flex, abd, ER  Grade III A/P L GH mobs, oscillations    S/L R: teres major Tpr     Therapeutic Activity - Justified to address the following: activities to improve functional performance.  Discussed and established goals    Education on anatomy, pathology, posture    Home Exercises   HEP: table slides (flex, abd, ER), strap IR , scap squeezes, UT stretches              Assessment   IE ASSESSMENT 01/06/2020: Pt is a 69  yo F experiencing L shoulder pain x 4 month after starting caring for her grand baby and sleeping at her daughter's home. She is no longer able to do her pilates routine due to pain. At evaluation, she has limited and painful motion in all planes with scapular weakness. She will benefit from PT to address these deficits in order to care for her grandchild, perform house chores, and return to pilates.  Prognosis: excellent  Plan   Visits per week: 2  Number of Sessions: 16  Direct One on One  16109: Therapeutic Exercise: To Develop Strength and Endurance, ROM and Flexibility  [id number]: Neuromuscular Reeducation  97140: Manual Therapy techniques (mobilization, manipulation, manual traction)  97530: Therapeutic Activities: Dynamic activities to improve functional performance  Dry Needling  Supervised Modalities  97010: Thermal modalities: hot/cold packs  60454: Electrical stimulation  Next visit: Pulleys, UBE          Layne Benton, DPT

## 2020-01-08 ENCOUNTER — Ambulatory Visit (INDEPENDENT_AMBULATORY_CARE_PROVIDER_SITE_OTHER): Payer: BLUE CROSS/BLUE SHIELD | Admitting: Physical Therapist

## 2020-01-08 DIAGNOSIS — M25512 Pain in left shoulder: Secondary | ICD-10-CM

## 2020-01-08 NOTE — PT/OT Therapy Note (Signed)
Name: Victoria Fields Age: 69 y.o.   Date of Service: 01/08/2020  Referring Physician:     Date of Injury: 08/19/2019  Date Care Plan Established/Reviewed: 01/06/2020  Date Treatment Started: 01/06/2020  End of Certification Date: 03/05/2020  Sessions in Plan of Care: 16  Surgery Date: No data was found    Visit Count: 2   Diagnosis:   1. Acute pain of left shoulder        Subjective     History of Present Illness   History of Present Illness:  S: shldr is still painful reaching OH and to the side L side.  Would like revision of her exercises.    LEVEL:  1/10 L shoulder, 5/10 at worst    Social Support/Occupation      Occupation: retired      Precautions: No data was found  Allergies: Cephalosporins, Strawberry extract, Cefuroxime, Dust mite extract, Molds & smuts, Niacin, Pollen extract, and Tree extract    Objective   L Shoulder Evaluation 01/06/2020  MOI: unknown    DOI: Upper arm with exercise the past 3-4 mo. March 1st 2021 sleeping on futon and helping daughter with newborn.     C/O: L upper arm pain    R hand dominant    Decrease: Ice, injection,    PLOF: pilates reformer    PMHx: chronic kidney disease    Diagnostics: xray 12/22/2019  1. No acute fracture or dislocation.  2.  Probable rotator cuff tendinopathy.     SPADI: 59/130    AROM BUEs  Flex R tight end-range, L 141  Abd R tight end-range, L 101  IR R T10, L T12  ER R 102, L 59 (supine at 90abd)    MMT BUEs  Flex R 4/5, L 2/5  Abd R 4/5, L 2/5  IR R 4/5, L 2/5  ER R 4/5, L 2/5  Post Delt 4/5  Mid Trap 3/5  Lower Trap R 3/5, L unable to assess    Special Tests:  Neers (+)  Hawkins-Kennedy (+)  Speeds (+) pain    Palpation: (+) UT, teres major/minor, pectorals               Treatment     Therapeutic Exercises - Justified to address any of the following: develop strength, endurance, ROM and/or flexibility.   ube retro lvl 2 x .    5 x   Seated selfmobs FF    Seated self mobs: abd    IR strap     3 x :  L ut stretching    Seated: x  30  scap retract and depression    Provided with HEP sheet.      Neuromuscular Re-Education - Justified to address any of the following: of movement, balance, coordination, kinesthetic sense, posture and/or proprioception for sitting and/or standing activities.   NOT DONE    Manual Therapy - Justified to address any of the following:  Mobilization of joints and soft tissues, manipulation, manual lymphatic drainage, and/or manual traction.    Supine:  Ut, lev scap DTM    CB, SO release    Inf and post GH mobs: grade III    Serratus releae    Inf mobs 1st rib L    Home Exercises   HEP: table slides (flex, abd, ER), strap IR , scap squeezes, UT stretches     Modalities   Skin check completed    Pain and DOMS reduction: hp x L  shldr: pt supine.         Assessment   IE ASSESSMENT 01/06/2020: Pt is a 69 yo F experiencing L shoulder pain x 4 month after starting caring for her grand baby and sleeping at her daughter's home. She is no longer able to do her pilates routine due to pain. At evaluation, she has limited and painful motion in all planes with scapular weakness. She will benefit from PT to address these deficits in order to care for her grandchild, perform house chores, and return to pilates.     DAILY ASSESSMENT: pt with contd pain and hypomobility L shldr with increased ut, lev scap tone: needing repeated VC,TC to correct alignment with scap retract activities.  Plan   Cont PT: progress to pulleys: ROM measurements L.      Goals    Goal 1: Independent and consistent with her HEP.   Sessions: 8      Goal 2: Improve her AROM abd to 160 to be able to reach into overhead cabinets   Sessions: 8      Goal 3: Improve her AROM flex and abd to 170 to be able to perform overh   Sessions: 16      Goal 4: Improve her SPADI score to 9/130 to be able to carry her newborn grandchild and wash with ease   Sessions: 16          Goal 5: Improve her pain level to 1/10 at worst with lifting her grandchild, washing her back,  and reaching overhead   Sessions: 16      Goal 6: Improve her IR to T10 to wash her back and donn a bra with ease.    Sessions: 16                      Bud Face, PT

## 2020-01-12 ENCOUNTER — Ambulatory Visit (INDEPENDENT_AMBULATORY_CARE_PROVIDER_SITE_OTHER): Payer: BLUE CROSS/BLUE SHIELD | Admitting: Physical Therapist

## 2020-01-12 DIAGNOSIS — M25512 Pain in left shoulder: Secondary | ICD-10-CM

## 2020-01-12 NOTE — PT/OT Therapy Note (Signed)
Name: Victoria Fields Age: 69 y.o.   Date of Service: 01/12/2020  Referring Physician: Phillips Odor, MD   Date of Injury: 08/19/2019  Date Care Plan Established/Reviewed: 01/06/2020  Date Treatment Started: 01/06/2020  End of Certification Date: 03/05/2020  Sessions in Plan of Care: 16  Surgery Date: No data was found    Visit Count: 3   Diagnosis:   1. Acute pain of left shoulder        Subjective     History of Present Illness   History of Present Illness:  S: L shldr is feeling sore and stiff today: she has been doing her exercises at home.    LEVEL:  1/10 L shoulder, 5/10 at worst    Social Support/Occupation      Occupation: retired      Precautions: No data was found  Allergies: Cephalosporins, Strawberry extract, Cefuroxime, Dust mite extract, Molds & smuts, Niacin, Pollen extract, and Tree extract    Objective   AROM L SHLDR APR 26  AROM BUEs  Flex R tight end-range, L 155DEG  Abd R tight end-range, L 135DEG  IR R T10, L T12 IR L 80DEG: SAYS HER SHLDR IS PULLING VS PAIN.  ER R 102, L 71DEG (supine at 90abd)          L Shoulder Evaluation 01/06/2020  MOI: unknown    DOI: Upper arm with exercise the past 3-4 mo. March 1st 2021 sleeping on futon and helping daughter with newborn.     C/O: L upper arm pain    R hand dominant    Decrease: Ice, injection,    PLOF: pilates reformer    PMHx: chronic kidney disease    Diagnostics: xray 12/22/2019  1. No acute fracture or dislocation.  2.  Probable rotator cuff tendinopathy.     SPADI: 59/130    AROM BUEs  Flex R tight end-range, L 141  Abd R tight end-range, L 101  IR R T10, L T12  ER R 102, L 59 (supine at 90abd)    MMT BUEs  Flex R 4/5, L 2/5  Abd R 4/5, L 2/5  IR R 4/5, L 2/5  ER R 4/5, L 2/5  Post Delt 4/5  Mid Trap 3/5  Lower Trap R 3/5, L unable to assess    Special Tests:  Neers (+)  Hawkins-Kennedy (+)  Speeds (+) pain    Palpation: (+) UT, teres major/minor, pectorals                   Treatment     Therapeutic Exercises - Justified to address any of  the following: develop strength, endurance, ROM and/or flexibility.   ube 5/5 lv 2        Neuromuscular Re-Education - Justified to address any of the following: of movement, balance, coordination, kinesthetic sense, posture and/or proprioception for sitting and/or standing activities.   NOT DONE    Manual Therapy - Justified to address any of the following:  Mobilization of joints and soft tissues, manipulation, manual lymphatic drainage, and/or manual traction.    Supine:  CB, SO release    Axilla release    GH oscillations    Grade III: inf and post GH mobs    Serratus release        Therapeutic Activity - Justified to address the following: activities to improve functional performance.  Prone:  X 30:  T  I  ZOX:WRUE retract and depression  4 x1   Table slides FF  Table slides ER    Home Exercises   HEP: table slides (flex, abd, ER), strap IR , scap squeezes, UT stretches     Modalities   Skin check completed    Pain reduction: hp x L shldr: pt supine.         Assessment   IE ASSESSMENT 01/06/2020: Pt is a 69 yo F experiencing L shoulder pain x 4 month after starting caring for her grand baby and sleeping at her daughter's home. She is no longer able to do her pilates routine due to pain. At evaluation, she has limited and painful motion in all planes with scapular weakness. She will benefit from PT to address these deficits in order to care for her grandchild, perform house chores, and return to pilates.     DAILY ASSESSMENT: pt with contd guarding L shldr and needing repeated cues for correct alignment of neck and shldrs to avoid fwrd head and rounded shldrs.  Plan   Cont PT: progress mobs, pain reduction and scap re-ed.      Goals    Goal 1: Independent and consistent with her HEP.   Sessions: 8      Goal 2: Improve her AROM abd to 160 to be able to reach into overhead cabinets   Sessions: 8      Goal 3: Improve her AROM flex and abd to 170 to be able to perform overh   Sessions: 16      Goal 4:  Improve her SPADI score to 9/130 to be able to carry her newborn grandchild and wash with ease   Sessions: 16          Goal 5: Improve her pain level to 1/10 at worst with lifting her grandchild, washing her back, and reaching overhead   Sessions: 16      Goal 6: Improve her IR to T10 to wash her back and donn a bra with ease.    Sessions: 16                      Bud Face, PT

## 2020-01-14 ENCOUNTER — Ambulatory Visit (INDEPENDENT_AMBULATORY_CARE_PROVIDER_SITE_OTHER): Payer: BLUE CROSS/BLUE SHIELD | Admitting: Physical Therapist

## 2020-01-14 DIAGNOSIS — M25512 Pain in left shoulder: Secondary | ICD-10-CM

## 2020-01-14 NOTE — PT/OT Therapy Note (Signed)
Name: Victoria Fields Age: 69 y.o.   Date of Service: 01/14/2020  Referring Physician: Phillips Odor, MD   Date of Injury: 08/19/2019  Date Care Plan Established/Reviewed: 01/06/2020  Date Treatment Started: 01/06/2020  End of Certification Date: 03/05/2020  Sessions in Plan of Care: 16  Surgery Date: No data was found    Visit Count: 4   Diagnosis:   1. Acute pain of left shoulder        Subjective     History of Present Illness   History of Present Illness:  S: shldr is still painful reaching OH and to the side L side.  Would like revision of her exercises.    LEVEL:  1/10 L shoulder, 5/10 at worst    Social Support/Occupation      Occupation: retired      Precautions: No data was found  Allergies: Cephalosporins, Strawberry extract, Cefuroxime, Dust mite extract, Molds & smuts, Niacin, Pollen extract, and Tree extract    Objective   L Shoulder Evaluation 01/06/2020  MOI: unknown    DOI: Upper arm with exercise the past 3-4 mo. March 1st 2021 sleeping on futon and helping daughter with newborn.     C/O: L upper arm pain    R hand dominant    Decrease: Ice, injection,    PLOF: pilates reformer    PMHx: chronic kidney disease    Diagnostics: xray 12/22/2019  1. No acute fracture or dislocation.  2.  Probable rotator cuff tendinopathy.     SPADI: 59/130    AROM BUEs  Flex R tight end-range, L 141  Abd R tight end-range, L 101  IR R T10, L T12  ER R 102, L 59 (supine at 90abd)    MMT BUEs  Flex R 4/5, L 2/5  Abd R 4/5, L 2/5  IR R 4/5, L 2/5  ER R 4/5, L 2/5  Post Delt 4/5  Mid Trap 3/5  Lower Trap R 3/5, L unable to assess    Special Tests:  Neers (+)  Hawkins-Kennedy (+)  Speeds (+) pain    Palpation: (+) UT, teres major/minor, pectorals               Treatment     Therapeutic Exercises - Justified to address any of the following: develop strength, endurance, ROM and/or flexibility.   ube retro lvl 2 x .    5 x   Seated selfmobs FF    Seated self mobs: abd    IR strap     3 x :  L ut  stretching    Seated: x 30  scap retract and depression    Provided with HEP sheet.      Neuromuscular Re-Education - Justified to address any of the following: of movement, balance, coordination, kinesthetic sense, posture and/or proprioception for sitting and/or standing activities.   NOT DONE    Manual Therapy - Justified to address any of the following:  Mobilization of joints and soft tissues, manipulation, manual lymphatic drainage, and/or manual traction.    Supine:  Ut, lev scap DTM    CB, SO release    Inf and post GH mobs: grade III    Serratus releae    Inf mobs 1st rib L    Home Exercises   HEP: table slides (flex, abd, ER), strap IR , scap squeezes, UT stretches     Modalities   Skin check completed    Pain and DOMS reduction: hp x  L shldr: pt supine.         Assessment   IE ASSESSMENT 01/06/2020: Pt is a 69 yo F experiencing L shoulder pain x 4 month after starting caring for her grand baby and sleeping at her daughter's home. She is no longer able to do her pilates routine due to pain. At evaluation, she has limited and painful motion in all planes with scapular weakness. She will benefit from PT to address these deficits in order to care for her grandchild, perform house chores, and return to pilates.     DAILY ASSESSMENT: pt with contd pain and hypomobility L shldr with increased ut, lev scap tone: needing repeated VC,TC to correct alignment with scap retract activities.  Plan   Cont PT: progress to pulleys: ROM measurements L.      Goals    Goal 1: Independent and consistent with her HEP.   Sessions: 8      Goal 2: Improve her AROM abd to 160 to be able to reach into overhead cabinets   Sessions: 8      Goal 3: Improve her AROM flex and abd to 170 to be able to perform overh   Sessions: 16      Goal 4: Improve her SPADI score to 9/130 to be able to carry her newborn grandchild and wash with ease   Sessions: 16          Goal 5: Improve her pain level to 1/10 at worst with lifting her  grandchild, washing her back, and reaching overhead   Sessions: 16      Goal 6: Improve her IR to T10 to wash her back and donn a bra with ease.    Sessions: 60                      Layne Benton, DPT        Name: Victoria Fields Age: 69 y.o.   Date of Service: 01/14/2020  Referring Physician: Phillips Odor, MD   Date of Injury: 08/19/2019  Date Care Plan Established/Reviewed: 01/06/2020  Date Treatment Started: 01/06/2020  End of Certification Date: 03/05/2020  Sessions in Plan of Care: 16  Surgery Date: No data was found    Visit Count: 4   Diagnosis:   1. Acute pain of left shoulder        Subjective     History of Present Illness   History of Present Illness:  S: Extremely sore since last session. Been trying ice and heat    LEVEL:  2/10 L shoulder, 5/10 at worst    Social Support/Occupation      Occupation: retired      Precautions: No data was found  Allergies: Cephalosporins, Strawberry extract, Cefuroxime, Dust mite extract, Molds & smuts, Niacin, Pollen extract, and Tree extract    Objective   AROM L SHLDR APR 26  AROM BUEs  Flex R tight end-range, L 155DEG  Abd R tight end-range, L 135DEG  IR R T10, L T12 IR L 80DEG: SAYS HER SHLDR IS PULLING VS PAIN.  ER R 102, L 71DEG (supine at 90abd)    L Shoulder Evaluation 01/06/2020  MOI: unknown    DOI: Upper arm with exercise the past 3-4 mo. March 1st 2021 sleeping on futon and helping daughter with newborn.     C/O: L upper arm pain    R hand dominant    Decrease: Ice, injection,    PLOF: pilates  reformer    PMHx: chronic kidney disease    Diagnostics: xray 12/22/2019  1. No acute fracture or dislocation.  2.  Probable rotator cuff tendinopathy.     SPADI: 59/130    AROM BUEs  Flex R tight end-range, L 141  Abd R tight end-range, L 101  IR R T10, L T12  ER R 102, L 59 (supine at 90abd)    MMT BUEs  Flex R 4/5, L 2/5  Abd R 4/5, L 2/5  IR R 4/5, L 2/5  ER R 4/5, L 2/5  Post Delt 4/5  Mid Trap 3/5  Lower Trap R 3/5, L unable to assess    Special  Tests:  Neers (+)  Hawkins-Kennedy (+)  Speeds (+) pain    Palpation: (+) UT, teres major/minor, pectorals                   Treatment     Therapeutic Exercises - Justified to address any of the following: develop strength, endurance, ROM and/or flexibility.   UBE LVL 5  x5 retro    UT stretches 3x71min    Doorway/corner stretches 3x85min    Prone I's 1# 3x10  Prone T's 3x10     Manual Therapy - Justified to address any of the following:  Mobilization of joints and soft tissues, manipulation, manual lymphatic drainage, and/or manual traction.    Supine:  Ut, lev scap DTM  CB, SO release  Inf and post GH mobs: grade III  Serratus releae  Inf mobs 1st rib L    S/L R: L scap release, teres major    Therapeutic Activity - Justified to address the following: activities to improve functional performance.  Seated lat pull downs Blue 3x10  Seated bilat rows bilat Rows Black 3x10    Wall pushups 3x10        Home Exercises   HEP: table slides (flex, abd, ER), strap IR , scap squeezes, UT stretches          Assessment   IE ASSESSMENT 01/06/2020: Pt is a 69 yo F experiencing L shoulder pain x 4 month after starting caring for her grand baby and sleeping at her daughter's home. She is no longer able to do her pilates routine due to pain. At evaluation, she has limited and painful motion in all planes with scapular weakness. She will benefit from PT to address these deficits in order to care for her grandchild, perform house chores, and return to pilates.     DAILY ASSESSMENT: Pt with experiencing increased soreness since last session. Able to tolerate exercises, but c/o pain. Tightness in UT due to muscle engagement with shoulder elevation.   Plan   Next visit: pulleys, spider and wall walks       Goals    Goal 1: Independent and consistent with her HEP.   Sessions: 8      Goal 2: Improve her AROM abd to 160 to be able to reach into overhead cabinets   Sessions: 8      Goal 3: Improve her AROM flex and abd to 170 to be able to  perform overh   Sessions: 16      Goal 4: Improve her SPADI score to 9/130 to be able to carry her newborn grandchild and wash with ease   Sessions: 16          Goal 5: Improve her pain level to 1/10 at worst with lifting her grandchild, washing her back,  and reaching overhead   Sessions: 16      Goal 6: Improve her IR to T10 to wash her back and donn a bra with ease.    Sessions: 16                      Layne Benton, DPT

## 2020-01-15 ENCOUNTER — Telehealth (INDEPENDENT_AMBULATORY_CARE_PROVIDER_SITE_OTHER): Payer: Self-pay | Admitting: Family Medicine

## 2020-01-15 NOTE — Telephone Encounter (Signed)
Dr. Meryle Ready office is requesting the last 6 months of lab results for the patient be faxed to their office.  Fax #: [phone].  Thank you.

## 2020-01-15 NOTE — Telephone Encounter (Signed)
faxed

## 2020-01-19 ENCOUNTER — Ambulatory Visit (INDEPENDENT_AMBULATORY_CARE_PROVIDER_SITE_OTHER): Payer: BLUE CROSS/BLUE SHIELD | Admitting: Physical Therapist

## 2020-01-19 DIAGNOSIS — M25512 Pain in left shoulder: Secondary | ICD-10-CM

## 2020-01-19 NOTE — PT/OT Therapy Note (Signed)
Name: Victoria Fields Age: 69 y.o.   Date of Service: 01/19/2020  Referring Physician: Phillips Odor, MD   Date of Injury: 08/19/2019  Date Care Plan Established/Reviewed: 01/06/2020  Date Treatment Started: 01/06/2020  End of Certification Date: 03/05/2020  Sessions in Plan of Care: 16  Surgery Date: No data was found    Visit Count: 5   Diagnosis:   1. Acute pain of left shoulder        Subjective     History of Present Illness   History of Present Illness:  S: shldr is still painful reaching OH and to the side L side.  Would like revision of her exercises.    LEVEL:  1/10 L shoulder, 5/10 at worst    Social Support/Occupation      Occupation: retired      Precautions: No data was found  Allergies: Cephalosporins, Strawberry extract, Cefuroxime, Dust mite extract, Molds & smuts, Niacin, Pollen extract, and Tree extract    Objective   L Shoulder Evaluation 01/06/2020  MOI: unknown    DOI: Upper arm with exercise the past 3-4 mo. March 1st 2021 sleeping on futon and helping daughter with newborn.     C/O: L upper arm pain    R hand dominant    Decrease: Ice, injection,    PLOF: pilates reformer    PMHx: chronic kidney disease    Diagnostics: xray 12/22/2019  1. No acute fracture or dislocation.  2.  Probable rotator cuff tendinopathy.     SPADI: 59/130    AROM BUEs  Flex R tight end-range, L 141  Abd R tight end-range, L 101  IR R T10, L T12  ER R 102, L 59 (supine at 90abd)    MMT BUEs  Flex R 4/5, L 2/5  Abd R 4/5, L 2/5  IR R 4/5, L 2/5  ER R 4/5, L 2/5  Post Delt 4/5  Mid Trap 3/5  Lower Trap R 3/5, L unable to assess    Special Tests:  Neers (+)  Hawkins-Kennedy (+)  Speeds (+) pain    Palpation: (+) UT, teres major/minor, pectorals               Treatment     Therapeutic Exercises - Justified to address any of the following: develop strength, endurance, ROM and/or flexibility.   ube retro lvl 2 x .    5 x   Seated selfmobs FF    Seated self mobs: abd    IR strap     3 x :  L ut  stretching    Seated: x 30  scap retract and depression    Provided with HEP sheet.      Neuromuscular Re-Education - Justified to address any of the following: of movement, balance, coordination, kinesthetic sense, posture and/or proprioception for sitting and/or standing activities.   NOT DONE    Manual Therapy - Justified to address any of the following:  Mobilization of joints and soft tissues, manipulation, manual lymphatic drainage, and/or manual traction.    Supine:  Ut, lev scap DTM    CB, SO release    Inf and post GH mobs: grade III    Serratus releae    Inf mobs 1st rib L    Home Exercises   HEP: table slides (flex, abd, ER), strap IR , scap squeezes, UT stretches     Modalities   Skin check completed    Pain and DOMS reduction: hp x  L shldr: pt supine.         Assessment   IE ASSESSMENT 01/06/2020: Pt is a 69 yo F experiencing L shoulder pain x 4 month after starting caring for her grand baby and sleeping at her daughter's home. She is no longer able to do her pilates routine due to pain. At evaluation, she has limited and painful motion in all planes with scapular weakness. She will benefit from PT to address these deficits in order to care for her grandchild, perform house chores, and return to pilates.     DAILY ASSESSMENT: pt with contd pain and hypomobility L shldr with increased ut, lev scap tone: needing repeated VC,TC to correct alignment with scap retract activities.  Plan   Cont PT: progress to pulleys: ROM measurements L.      Goals    Goal 1: Independent and consistent with her HEP.   Sessions: 8      Goal 2: Improve her AROM abd to 160 to be able to reach into overhead cabinets   Sessions: 8      Goal 3: Improve her AROM flex and abd to 170 to be able to perform overh   Sessions: 16      Goal 4: Improve her SPADI score to 9/130 to be able to carry her newborn grandchild and wash with ease   Sessions: 16          Goal 5: Improve her pain level to 1/10 at worst with lifting her  grandchild, washing her back, and reaching overhead   Sessions: 16      Goal 6: Improve her IR to T10 to wash her back and donn a bra with ease.    Sessions: 54                      Layne Benton, DPT        Name: Victoria Fields Age: 69 y.o.   Date of Service: 01/19/2020  Referring Physician: Phillips Odor, MD   Date of Injury: 08/19/2019  Date Care Plan Established/Reviewed: 01/06/2020  Date Treatment Started: 01/06/2020  End of Certification Date: 03/05/2020  Sessions in Plan of Care: 16  Surgery Date: No data was found    Visit Count: 5   Diagnosis:   1. Acute pain of left shoulder        Subjective     History of Present Illness   History of Present Illness:  S: Extremely sore since last session. Been trying ice and heat    LEVEL:  2/10 L shoulder, 5/10 at worst    Social Support/Occupation      Occupation: retired      Precautions: No data was found  Allergies: Cephalosporins, Strawberry extract, Cefuroxime, Dust mite extract, Molds & smuts, Niacin, Pollen extract, and Tree extract    Objective   AROM L SHLDR APR 26  AROM BUEs  Flex R tight end-range, L 155DEG  Abd R tight end-range, L 135DEG  IR R T10, L T12 IR L 80DEG: SAYS HER SHLDR IS PULLING VS PAIN.  ER R 102, L 71DEG (supine at 90abd)    L Shoulder Evaluation 01/06/2020  MOI: unknown    DOI: Upper arm with exercise the past 3-4 mo. March 1st 2021 sleeping on futon and helping daughter with newborn.     C/O: L upper arm pain    R hand dominant    Decrease: Ice, injection,    PLOF: pilates  reformer    PMHx: chronic kidney disease    Diagnostics: xray 12/22/2019  1. No acute fracture or dislocation.  2.  Probable rotator cuff tendinopathy.     SPADI: 59/130    AROM BUEs  Flex R tight end-range, L 141  Abd R tight end-range, L 101  IR R T10, L T12  ER R 102, L 59 (supine at 90abd)    MMT BUEs  Flex R 4/5, L 2/5  Abd R 4/5, L 2/5  IR R 4/5, L 2/5  ER R 4/5, L 2/5  Post Delt 4/5  Mid Trap 3/5  Lower Trap R 3/5, L unable to assess    Special  Tests:  Neers (+)  Hawkins-Kennedy (+)  Speeds (+) pain    Palpation: (+) UT, teres major/minor, pectorals                   Treatment     Therapeutic Exercises - Justified to address any of the following: develop strength, endurance, ROM and/or flexibility.   UBE LVL 5  x5 retro    UT stretches 3x17min    Doorway/corner stretches 3x17min    Prone I's 1# 3x10  Prone T's 3x10     Manual Therapy - Justified to address any of the following:  Mobilization of joints and soft tissues, manipulation, manual lymphatic drainage, and/or manual traction.    Supine:  Ut, lev scap DTM  CB, SO release  Inf and post GH mobs: grade III  Serratus releae  Inf mobs 1st rib L    S/L R: L scap release, teres major    Therapeutic Activity - Justified to address the following: activities to improve functional performance.  Seated lat pull downs Blue 3x10  Seated bilat rows bilat Rows Black 3x10    Wall pushups 3x10        Home Exercises   HEP: table slides (flex, abd, ER), strap IR , scap squeezes, UT stretches          Assessment   IE ASSESSMENT 01/06/2020: Pt is a 69 yo F experiencing L shoulder pain x 4 month after starting caring for her grand baby and sleeping at her daughter's home. She is no longer able to do her pilates routine due to pain. At evaluation, she has limited and painful motion in all planes with scapular weakness. She will benefit from PT to address these deficits in order to care for her grandchild, perform house chores, and return to pilates.     DAILY ASSESSMENT: Pt with experiencing increased soreness since last session. Able to tolerate exercises, but c/o pain. Tightness in UT due to muscle engagement with shoulder elevation.   Plan   Next visit: pulleys, spider and wall walks       Goals    Goal 1: Independent and consistent with her HEP.   Sessions: 8      Goal 2: Improve her AROM abd to 160 to be able to reach into overhead cabinets   Sessions: 8      Goal 3: Improve her AROM flex and abd to 170 to be able to  perform overh   Sessions: 16      Goal 4: Improve her SPADI score to 9/130 to be able to carry her newborn grandchild and wash with ease   Sessions: 16          Goal 5: Improve her pain level to 1/10 at worst with lifting her grandchild, washing her back,  and reaching overhead   Sessions: 16      Goal 6: Improve her IR to T10 to wash her back and donn a bra with ease.    Sessions: 17                      Layne Benton, DPT        Name: Victoria Fields Age: 69 y.o.   Date of Service: 01/19/2020  Referring Physician: Phillips Odor, MD   Date of Injury: 08/19/2019  Date Care Plan Established/Reviewed: 01/06/2020  Date Treatment Started: 01/06/2020  End of Certification Date: 03/05/2020  Sessions in Plan of Care: 16  Surgery Date: No data was found    Visit Count: 5   Diagnosis:   1. Acute pain of left shoulder        Subjective     History of Present Illness   History of Present Illness:  S: shldr is still painful reaching OH and to the side L side.  Would like revision of her exercises.    LEVEL:  1/10 L shoulder, 5/10 at worst    Social Support/Occupation      Occupation: retired      Precautions: No data was found  Allergies: Cephalosporins, Strawberry extract, Cefuroxime, Dust mite extract, Molds & smuts, Niacin, Pollen extract, and Tree extract    Objective   L Shoulder Evaluation 01/06/2020  MOI: unknown    DOI: Upper arm with exercise the past 3-4 mo. March 1st 2021 sleeping on futon and helping daughter with newborn.     C/O: L upper arm pain    R hand dominant    Decrease: Ice, injection,    PLOF: pilates reformer    PMHx: chronic kidney disease    Diagnostics: xray 12/22/2019  1. No acute fracture or dislocation.  2.  Probable rotator cuff tendinopathy.     SPADI: 59/130    AROM BUEs  Flex R tight end-range, L 141  Abd R tight end-range, L 101  IR R T10, L T12  ER R 102, L 59 (supine at 90abd)    MMT BUEs  Flex R 4/5, L 2/5  Abd R 4/5, L 2/5  IR R 4/5, L 2/5  ER R 4/5, L 2/5  Post Delt 4/5  Mid Trap  3/5  Lower Trap R 3/5, L unable to assess    Special Tests:  Neers (+)  Hawkins-Kennedy (+)  Speeds (+) pain    Palpation: (+) UT, teres major/minor, pectorals               Treatment     Therapeutic Exercises - Justified to address any of the following: develop strength, endurance, ROM and/or flexibility.   ube retro lvl 2 x .    5 x   Seated selfmobs FF    Seated self mobs: abd    IR strap     3 x :  L ut stretching    Seated: x 30  scap retract and depression    Provided with HEP sheet.      Neuromuscular Re-Education - Justified to address any of the following: of movement, balance, coordination, kinesthetic sense, posture and/or proprioception for sitting and/or standing activities.   NOT DONE    Manual Therapy - Justified to address any of the following:  Mobilization of joints and soft tissues, manipulation, manual lymphatic drainage, and/or manual traction.    Supine:  Ut, lev scap DTM  CB, SO release    Inf and post GH mobs: grade III    Serratus releae    Inf mobs 1st rib L    Home Exercises   HEP: table slides (flex, abd, ER), strap IR , scap squeezes, UT stretches     Modalities   Skin check completed    Pain and DOMS reduction: hp x L shldr: pt supine.         Assessment   IE ASSESSMENT 01/06/2020: Pt is a 69 yo F experiencing L shoulder pain x 4 month after starting caring for her grand baby and sleeping at her daughter's home. She is no longer able to do her pilates routine due to pain. At evaluation, she has limited and painful motion in all planes with scapular weakness. She will benefit from PT to address these deficits in order to care for her grandchild, perform house chores, and return to pilates.     DAILY ASSESSMENT: pt with contd pain and hypomobility L shldr with increased ut, lev scap tone: needing repeated VC,TC to correct alignment with scap retract activities.  Plan   Cont PT: progress to pulleys: ROM measurements L.      Goals    Goal 1: Independent and  consistent with her HEP.   Sessions: 8      Goal 2: Improve her AROM abd to 160 to be able to reach into overhead cabinets   Sessions: 8      Goal 3: Improve her AROM flex and abd to 170 to be able to perform overh   Sessions: 16      Goal 4: Improve her SPADI score to 9/130 to be able to carry her newborn grandchild and wash with ease   Sessions: 16          Goal 5: Improve her pain level to 1/10 at worst with lifting her grandchild, washing her back, and reaching overhead   Sessions: 16      Goal 6: Improve her IR to T10 to wash her back and donn a bra with ease.    Sessions: 81                      Layne Benton, DPT        Name: Victoria Fields Age: 69 y.o.   Date of Service: 01/19/2020  Referring Physician: Phillips Odor, MD   Date of Injury: 08/19/2019  Date Care Plan Established/Reviewed: 01/06/2020  Date Treatment Started: 01/06/2020  End of Certification Date: 03/05/2020  Sessions in Plan of Care: 16  Surgery Date: No data was found    Visit Count: 5   Diagnosis:   1. Acute pain of left shoulder        Subjective     History of Present Illness   History of Present Illness:  S: Extremely sore since last session. Been trying ice and heat    LEVEL:  2/10 L shoulder, 5/10 at worst    Social Support/Occupation      Occupation: retired      Precautions: No data was found  Allergies: Cephalosporins, Strawberry extract, Cefuroxime, Dust mite extract, Molds & smuts, Niacin, Pollen extract, and Tree extract    Objective   AROM L SHLDR APR 26  AROM BUEs  Flex R tight end-range, L 155DEG  Abd R tight end-range, L 135DEG  IR R T10, L T12 IR L 80DEG: SAYS HER SHLDR IS PULLING VS PAIN.  ER R  102, L 71DEG (supine at 90abd)    L Shoulder Evaluation 01/06/2020  MOI: unknown    DOI: Upper arm with exercise the past 3-4 mo. March 1st 2021 sleeping on futon and helping daughter with newborn.     C/O: L upper arm pain    R hand dominant    Decrease: Ice, injection,    PLOF: pilates reformer    PMHx: chronic kidney  disease    Diagnostics: xray 12/22/2019  1. No acute fracture or dislocation.  2.  Probable rotator cuff tendinopathy.     SPADI: 59/130    AROM BUEs  Flex R tight end-range, L 141  Abd R tight end-range, L 101  IR R T10, L T12  ER R 102, L 59 (supine at 90abd)    MMT BUEs  Flex R 4/5, L 2/5  Abd R 4/5, L 2/5  IR R 4/5, L 2/5  ER R 4/5, L 2/5  Post Delt 4/5  Mid Trap 3/5  Lower Trap R 3/5, L unable to assess    Special Tests:  Neers (+)  Hawkins-Kennedy (+)  Speeds (+) pain    Palpation: (+) UT, teres major/minor, pectorals                   Treatment     Therapeutic Exercises - Justified to address any of the following: develop strength, endurance, ROM and/or flexibility.   UBE LVL 5  x5 retro    UT stretches 3x39min    Doorway/corner stretches 3x18min    Prone I's 1# 3x10  Prone T's 3x10     Manual Therapy - Justified to address any of the following:  Mobilization of joints and soft tissues, manipulation, manual lymphatic drainage, and/or manual traction.    Supine:  Ut, lev scap DTM  CB, SO release  Inf and post GH mobs: grade III  Serratus releae  Inf mobs 1st rib L    S/L R: L scap release, teres major    Therapeutic Activity - Justified to address the following: activities to improve functional performance.  Seated lat pull downs Blue 3x10  Seated bilat rows bilat Rows Black 3x10    Wall pushups 3x10        Home Exercises   HEP: table slides (flex, abd, ER), strap IR , scap squeezes, UT stretches          Assessment   IE ASSESSMENT 01/06/2020: Pt is a 69 yo F experiencing L shoulder pain x 4 month after starting caring for her grand baby and sleeping at her daughter's home. She is no longer able to do her pilates routine due to pain. At evaluation, she has limited and painful motion in all planes with scapular weakness. She will benefit from PT to address these deficits in order to care for her grandchild, perform house chores, and return to pilates.     DAILY ASSESSMENT: Pt with experiencing increased  soreness since last session. Able to tolerate exercises, but c/o pain. Tightness in UT due to muscle engagement with shoulder elevation.   Plan   Next visit: pulleys, spider and wall walks       Goals    Goal 1: Independent and consistent with her HEP.   Sessions: 8      Goal 2: Improve her AROM abd to 160 to be able to reach into overhead cabinets   Sessions: 8      Goal 3: Improve her AROM flex and abd to 170  to be able to perform overh   Sessions: 16      Goal 4: Improve her SPADI score to 9/130 to be able to carry her newborn grandchild and wash with ease   Sessions: 16          Goal 5: Improve her pain level to 1/10 at worst with lifting her grandchild, washing her back, and reaching overhead   Sessions: 16      Goal 6: Improve her IR to T10 to wash her back and donn a bra with ease.    Sessions: 27                      Layne Benton, DPT        Name: Victoria Fields Age: 69 y.o.   Date of Service: 01/19/2020  Referring Physician: Phillips Odor, MD   Date of Injury: 08/19/2019  Date Care Plan Established/Reviewed: 01/06/2020  Date Treatment Started: 01/06/2020  End of Certification Date: 03/05/2020  Sessions in Plan of Care: 16  Surgery Date: No data was found    Visit Count: 5   Diagnosis:   No diagnosis found.    Subjective     History of Present Illness   History of Present Illness:  S: shldr is still painful reaching OH and to the side L side.  Would like revision of her exercises.    LEVEL:  1/10 L shoulder, 5/10 at worst    Social Support/Occupation      Occupation: retired      Precautions: No data was found  Allergies: Cephalosporins, Strawberry extract, Cefuroxime, Dust mite extract, Molds & smuts, Niacin, Pollen extract, and Tree extract    Objective   L Shoulder Evaluation 01/06/2020  MOI: unknown    DOI: Upper arm with exercise the past 3-4 mo. March 1st 2021 sleeping on futon and helping daughter with newborn.     C/O: L upper arm pain    R hand dominant    Decrease: Ice, injection,    PLOF:  pilates reformer    PMHx: chronic kidney disease    Diagnostics: xray 12/22/2019  1. No acute fracture or dislocation.  2.  Probable rotator cuff tendinopathy.     SPADI: 59/130    AROM BUEs  Flex R tight end-range, L 141  Abd R tight end-range, L 101  IR R T10, L T12  ER R 102, L 59 (supine at 90abd)    MMT BUEs  Flex R 4/5, L 2/5  Abd R 4/5, L 2/5  IR R 4/5, L 2/5  ER R 4/5, L 2/5  Post Delt 4/5  Mid Trap 3/5  Lower Trap R 3/5, L unable to assess    Special Tests:  Neers (+)  Hawkins-Kennedy (+)  Speeds (+) pain    Palpation: (+) UT, teres major/minor, pectorals               Treatment     Therapeutic Exercises - Justified to address any of the following: develop strength, endurance, ROM and/or flexibility.   ube retro lvl 2 x .    5 x   Seated selfmobs FF    Seated self mobs: abd    IR strap     3 x :  L ut stretching    Seated: x 30  scap retract and depression    Provided with HEP sheet.      Neuromuscular Re-Education - Justified to address any of  the following: of movement, balance, coordination, kinesthetic sense, posture and/or proprioception for sitting and/or standing activities.   NOT DONE    Manual Therapy - Justified to address any of the following:  Mobilization of joints and soft tissues, manipulation, manual lymphatic drainage, and/or manual traction.    Supine:  Ut, lev scap DTM    CB, SO release    Inf and post GH mobs: grade III    Serratus releae    Inf mobs 1st rib L    Home Exercises   HEP: table slides (flex, abd, ER), strap IR , scap squeezes, UT stretches     Modalities   Skin check completed    Pain and DOMS reduction: hp x L shldr: pt supine.         Assessment   IE ASSESSMENT 01/06/2020: Pt is a 69 yo F experiencing L shoulder pain x 4 month after starting caring for her grand baby and sleeping at her daughter's home. She is no longer able to do her pilates routine due to pain. At evaluation, she has limited and painful motion in all planes with scapular  weakness. She will benefit from PT to address these deficits in order to care for her grandchild, perform house chores, and return to pilates.     DAILY ASSESSMENT: pt with contd pain and hypomobility L shldr with increased ut, lev scap tone: needing repeated VC,TC to correct alignment with scap retract activities.  Plan   Cont PT: progress to pulleys: ROM measurements L.      Goals    Goal 1: Independent and consistent with her HEP.   Sessions: 8      Goal 2: Improve her AROM abd to 160 to be able to reach into overhead cabinets   Sessions: 8      Goal 3: Improve her AROM flex and abd to 170 to be able to perform overh   Sessions: 16      Goal 4: Improve her SPADI score to 9/130 to be able to carry her newborn grandchild and wash with ease   Sessions: 16          Goal 5: Improve her pain level to 1/10 at worst with lifting her grandchild, washing her back, and reaching overhead   Sessions: 16      Goal 6: Improve her IR to T10 to wash her back and donn a bra with ease.    Sessions: 78                      Layne Benton, DPT        Name: Victoria Fields Age: 69 y.o.   Date of Service: 01/19/2020  Referring Physician: Phillips Odor, MD   Date of Injury: 08/19/2019  Date Care Plan Established/Reviewed: 01/06/2020  Date Treatment Started: 01/06/2020  End of Certification Date: 03/05/2020  Sessions in Plan of Care: 16  Surgery Date: No data was found    Visit Count: 5   Diagnosis:   No diagnosis found.    Subjective     History of Present Illness   History of Present Illness:  S: Extremely sore since last session. Been trying ice and heat    LEVEL:  2/10 L shoulder, 5/10 at worst    Social Support/Occupation      Occupation: retired      Precautions: No data was found  Allergies: Cephalosporins, Strawberry extract, Cefuroxime, Dust mite extract, Molds & smuts, Niacin,  Pollen extract, and Tree extract    Objective   AROM L SHLDR APR 26  AROM BUEs  Flex R tight end-range, L 155DEG  Abd R tight end-range, L 135DEG  IR R T10,  L T12 IR L 80DEG: SAYS HER SHLDR IS PULLING VS PAIN.  ER R 102, L 71DEG (supine at 90abd)    L Shoulder Evaluation 01/06/2020  MOI: unknown    DOI: Upper arm with exercise the past 3-4 mo. March 1st 2021 sleeping on futon and helping daughter with newborn.     C/O: L upper arm pain    R hand dominant    Decrease: Ice, injection,    PLOF: pilates reformer    PMHx: chronic kidney disease    Diagnostics: xray 12/22/2019  1. No acute fracture or dislocation.  2.  Probable rotator cuff tendinopathy.     SPADI: 59/130    AROM BUEs  Flex R tight end-range, L 141  Abd R tight end-range, L 101  IR R T10, L T12  ER R 102, L 59 (supine at 90abd)    MMT BUEs  Flex R 4/5, L 2/5  Abd R 4/5, L 2/5  IR R 4/5, L 2/5  ER R 4/5, L 2/5  Post Delt 4/5  Mid Trap 3/5  Lower Trap R 3/5, L unable to assess    Special Tests:  Neers (+)  Hawkins-Kennedy (+)  Speeds (+) pain    Palpation: (+) UT, teres major/minor, pectorals                   Treatment     Therapeutic Exercises - Justified to address any of the following: develop strength, endurance, ROM and/or flexibility.   UBE LVL 5  x5 retro    UT stretches 3x24min    Doorway/corner stretches 3x64min    Prone I's 1# 3x10  Prone T's 3x10     Manual Therapy - Justified to address any of the following:  Mobilization of joints and soft tissues, manipulation, manual lymphatic drainage, and/or manual traction.    Supine:  Ut, lev scap DTM  CB, SO release  Inf and post GH mobs: grade III  Serratus releae  Inf mobs 1st rib L    S/L R: L scap release, teres major    Therapeutic Activity - Justified to address the following: activities to improve functional performance.  Seated lat pull downs Blue 3x10  Seated bilat rows bilat Rows Black 3x10    Wall pushups 3x10    Spider walk RTB 3x10    Seated bilat ER RTB 3x10  Seated bilat T's RTB 3x10      Home Exercises   HEP: table slides (flex, abd, ER), strap IR , scap squeezes, UT stretches     Modalities   Skin check completed    HP L  shoulder in supine for pain x         Assessment   IE ASSESSMENT 01/06/2020: Pt is a 68 yo F experiencing L shoulder pain x 4 month after starting caring for her grand baby and sleeping at her daughter's home. She is no longer able to do her pilates routine due to pain. At evaluation, she has limited and painful motion in all planes with scapular weakness. She will benefit from PT to address these deficits in order to care for her grandchild, perform house chores, and return to pilates.     DAILY ASSESSMENT: Pt with less pain in her L  shoulder after performing UT stretches. Sore with new activities reported but able to complete.   Plan   Next visit: pulleys and wall walks       Goals    Goal 1: Independent and consistent with her HEP.   Sessions: 8      Goal 2: Improve her AROM abd to 160 to be able to reach into overhead cabinets   Sessions: 8      Goal 3: Improve her AROM flex and abd to 170 to be able to perform overh   Sessions: 16      Goal 4: Improve her SPADI score to 9/130 to be able to carry her newborn grandchild and wash with ease   Sessions: 16          Goal 5: Improve her pain level to 1/10 at worst with lifting her grandchild, washing her back, and reaching overhead   Sessions: 16      Goal 6: Improve her IR to T10 to wash her back and donn a bra with ease.    Sessions: 16                      Layne Benton, DPT

## 2020-01-20 ENCOUNTER — Encounter (INDEPENDENT_AMBULATORY_CARE_PROVIDER_SITE_OTHER): Payer: Self-pay | Admitting: Family Medicine

## 2020-01-21 ENCOUNTER — Ambulatory Visit (INDEPENDENT_AMBULATORY_CARE_PROVIDER_SITE_OTHER): Payer: BLUE CROSS/BLUE SHIELD | Admitting: Physical Therapist

## 2020-01-21 DIAGNOSIS — M25512 Pain in left shoulder: Secondary | ICD-10-CM

## 2020-01-21 NOTE — PT/OT Therapy Note (Signed)
Name: Victoria Fields Age: 69 y.o.   Date of Service: 01/21/2020  Referring Physician: Phillips Odor, MD   Date of Injury: 08/19/2019  Date Care Plan Established/Reviewed: 01/06/2020  Date Treatment Started: 01/06/2020  End of Certification Date: 03/05/2020  Sessions in Plan of Care: 16  Surgery Date: No data was found    Visit Count: 6   Diagnosis:   1. Acute pain of left shoulder        Subjective     History of Present Illness   History of Present Illness:  S: shldr is still painful reaching OH and to the side L side.  Would like revision of her exercises.    LEVEL:  1/10 L shoulder, 5/10 at worst    Social Support/Occupation      Occupation: retired      Precautions: No data was found  Allergies: Cephalosporins, Strawberry extract, Cefuroxime, Dust mite extract, Molds & smuts, Niacin, Pollen extract, and Tree extract    Objective   L Shoulder Evaluation 01/06/2020  MOI: unknown    DOI: Upper arm with exercise the past 3-4 mo. March 1st 2021 sleeping on futon and helping daughter with newborn.     C/O: L upper arm pain    R hand dominant    Decrease: Ice, injection,    PLOF: pilates reformer    PMHx: chronic kidney disease    Diagnostics: xray 12/22/2019  1. No acute fracture or dislocation.  2.  Probable rotator cuff tendinopathy.     SPADI: 59/130    AROM BUEs  Flex R tight end-range, L 141  Abd R tight end-range, L 101  IR R T10, L T12  ER R 102, L 59 (supine at 90abd)    MMT BUEs  Flex R 4/5, L 2/5  Abd R 4/5, L 2/5  IR R 4/5, L 2/5  ER R 4/5, L 2/5  Post Delt 4/5  Mid Trap 3/5  Lower Trap R 3/5, L unable to assess    Special Tests:  Neers (+)  Hawkins-Kennedy (+)  Speeds (+) pain    Palpation: (+) UT, teres major/minor, pectorals               Treatment     Therapeutic Exercises - Justified to address any of the following: develop strength, endurance, ROM and/or flexibility.   ube retro lvl 2 x .    5 x   Seated selfmobs FF    Seated self mobs: abd    IR strap     3 x :  L ut  stretching    Seated: x 30  scap retract and depression    Provided with HEP sheet.      Neuromuscular Re-Education - Justified to address any of the following: of movement, balance, coordination, kinesthetic sense, posture and/or proprioception for sitting and/or standing activities.   NOT DONE    Manual Therapy - Justified to address any of the following:  Mobilization of joints and soft tissues, manipulation, manual lymphatic drainage, and/or manual traction.    Supine:  Ut, lev scap DTM    CB, SO release    Inf and post GH mobs: grade III    Serratus releae    Inf mobs 1st rib L    Home Exercises   HEP: table slides (flex, abd, ER), strap IR , scap squeezes, UT stretches     Modalities   Skin check completed    Pain and DOMS reduction: hp x  L shldr: pt supine.         Assessment   IE ASSESSMENT 01/06/2020: Pt is a 69 yo F experiencing L shoulder pain x 4 month after starting caring for her grand baby and sleeping at her daughter's home. She is no longer able to do her pilates routine due to pain. At evaluation, she has limited and painful motion in all planes with scapular weakness. She will benefit from PT to address these deficits in order to care for her grandchild, perform house chores, and return to pilates.     DAILY ASSESSMENT: pt with contd pain and hypomobility L shldr with increased ut, lev scap tone: needing repeated VC,TC to correct alignment with scap retract activities.  Plan   Cont PT: progress to pulleys: ROM measurements L.      Goals    Goal 1: Independent and consistent with her HEP.   Sessions: 8      Goal 2: Improve her AROM abd to 160 to be able to reach into overhead cabinets   Sessions: 8      Goal 3: Improve her AROM flex and abd to 170 to be able to perform overh   Sessions: 16      Goal 4: Improve her SPADI score to 9/130 to be able to carry her newborn grandchild and wash with ease   Sessions: 16          Goal 5: Improve her pain level to 1/10 at worst with lifting her  grandchild, washing her back, and reaching overhead   Sessions: 16      Goal 6: Improve her IR to T10 to wash her back and donn a bra with ease.    Sessions: 40                      Layne Benton, DPT        Name: Victoria Fields Age: 69 y.o.   Date of Service: 01/21/2020  Referring Physician: Phillips Odor, MD   Date of Injury: 08/19/2019  Date Care Plan Established/Reviewed: 01/06/2020  Date Treatment Started: 01/06/2020  End of Certification Date: 03/05/2020  Sessions in Plan of Care: 16  Surgery Date: No data was found    Visit Count: 6   Diagnosis:   1. Acute pain of left shoulder        Subjective     History of Present Illness   History of Present Illness:  S: Extremely sore since last session. Been trying ice and heat    LEVEL:  2/10 L shoulder, 5/10 at worst    Social Support/Occupation      Occupation: retired      Precautions: No data was found  Allergies: Cephalosporins, Strawberry extract, Cefuroxime, Dust mite extract, Molds & smuts, Niacin, Pollen extract, and Tree extract    Objective   AROM L SHLDR APR 26  AROM BUEs  Flex R tight end-range, L 155DEG  Abd R tight end-range, L 135DEG  IR R T10, L T12 IR L 80DEG: SAYS HER SHLDR IS PULLING VS PAIN.  ER R 102, L 71DEG (supine at 90abd)    L Shoulder Evaluation 01/06/2020  MOI: unknown    DOI: Upper arm with exercise the past 3-4 mo. March 1st 2021 sleeping on futon and helping daughter with newborn.     C/O: L upper arm pain    R hand dominant    Decrease: Ice, injection,    PLOF: pilates  reformer    PMHx: chronic kidney disease    Diagnostics: xray 12/22/2019  1. No acute fracture or dislocation.  2.  Probable rotator cuff tendinopathy.     SPADI: 59/130    AROM BUEs  Flex R tight end-range, L 141  Abd R tight end-range, L 101  IR R T10, L T12  ER R 102, L 59 (supine at 90abd)    MMT BUEs  Flex R 4/5, L 2/5  Abd R 4/5, L 2/5  IR R 4/5, L 2/5  ER R 4/5, L 2/5  Post Delt 4/5  Mid Trap 3/5  Lower Trap R 3/5, L unable to assess    Special  Tests:  Neers (+)  Hawkins-Kennedy (+)  Speeds (+) pain    Palpation: (+) UT, teres major/minor, pectorals                   Treatment     Therapeutic Exercises - Justified to address any of the following: develop strength, endurance, ROM and/or flexibility.   UBE LVL 5  x5 retro    UT stretches 3x75min    Doorway/corner stretches 3x31min    Prone I's 1# 3x10  Prone T's 3x10     Manual Therapy - Justified to address any of the following:  Mobilization of joints and soft tissues, manipulation, manual lymphatic drainage, and/or manual traction.    Supine:  Ut, lev scap DTM  CB, SO release  Inf and post GH mobs: grade III  Serratus releae  Inf mobs 1st rib L    S/L R: L scap release, teres major    Therapeutic Activity - Justified to address the following: activities to improve functional performance.  Seated lat pull downs Blue 3x10  Seated bilat rows bilat Rows Black 3x10    Wall pushups 3x10        Home Exercises   HEP: table slides (flex, abd, ER), strap IR , scap squeezes, UT stretches          Assessment   IE ASSESSMENT 01/06/2020: Pt is a 69 yo F experiencing L shoulder pain x 4 month after starting caring for her grand baby and sleeping at her daughter's home. She is no longer able to do her pilates routine due to pain. At evaluation, she has limited and painful motion in all planes with scapular weakness. She will benefit from PT to address these deficits in order to care for her grandchild, perform house chores, and return to pilates.     DAILY ASSESSMENT: Pt with experiencing increased soreness since last session. Able to tolerate exercises, but c/o pain. Tightness in UT due to muscle engagement with shoulder elevation.   Plan   Next visit: pulleys, spider and wall walks       Goals    Goal 1: Independent and consistent with her HEP.   Sessions: 8      Goal 2: Improve her AROM abd to 160 to be able to reach into overhead cabinets   Sessions: 8      Goal 3: Improve her AROM flex and abd to 170 to be able to  perform overh   Sessions: 16      Goal 4: Improve her SPADI score to 9/130 to be able to carry her newborn grandchild and wash with ease   Sessions: 16          Goal 5: Improve her pain level to 1/10 at worst with lifting her grandchild, washing her back,  and reaching overhead   Sessions: 16      Goal 6: Improve her IR to T10 to wash her back and donn a bra with ease.    Sessions: 46                      Layne Benton, DPT        Name: Brittiany Wiehe Age: 69 y.o.   Date of Service: 01/21/2020  Referring Physician: Phillips Odor, MD   Date of Injury: 08/19/2019  Date Care Plan Established/Reviewed: 01/06/2020  Date Treatment Started: 01/06/2020  End of Certification Date: 03/05/2020  Sessions in Plan of Care: 16  Surgery Date: No data was found    Visit Count: 6   Diagnosis:   1. Acute pain of left shoulder        Subjective     History of Present Illness   History of Present Illness:  S: shldr is still painful reaching OH and to the side L side.  Would like revision of her exercises.    LEVEL:  1/10 L shoulder, 5/10 at worst    Social Support/Occupation      Occupation: retired      Precautions: No data was found  Allergies: Cephalosporins, Strawberry extract, Cefuroxime, Dust mite extract, Molds & smuts, Niacin, Pollen extract, and Tree extract    Objective   L Shoulder Evaluation 01/06/2020  MOI: unknown    DOI: Upper arm with exercise the past 3-4 mo. March 1st 2021 sleeping on futon and helping daughter with newborn.     C/O: L upper arm pain    R hand dominant    Decrease: Ice, injection,    PLOF: pilates reformer    PMHx: chronic kidney disease    Diagnostics: xray 12/22/2019  1. No acute fracture or dislocation.  2.  Probable rotator cuff tendinopathy.     SPADI: 59/130    AROM BUEs  Flex R tight end-range, L 141  Abd R tight end-range, L 101  IR R T10, L T12  ER R 102, L 59 (supine at 90abd)    MMT BUEs  Flex R 4/5, L 2/5  Abd R 4/5, L 2/5  IR R 4/5, L 2/5  ER R 4/5, L 2/5  Post Delt 4/5  Mid Trap  3/5  Lower Trap R 3/5, L unable to assess    Special Tests:  Neers (+)  Hawkins-Kennedy (+)  Speeds (+) pain    Palpation: (+) UT, teres major/minor, pectorals               Treatment     Therapeutic Exercises - Justified to address any of the following: develop strength, endurance, ROM and/or flexibility.   ube retro lvl 2 x .    5 x   Seated selfmobs FF    Seated self mobs: abd    IR strap     3 x :  L ut stretching    Seated: x 30  scap retract and depression    Provided with HEP sheet.      Neuromuscular Re-Education - Justified to address any of the following: of movement, balance, coordination, kinesthetic sense, posture and/or proprioception for sitting and/or standing activities.   NOT DONE    Manual Therapy - Justified to address any of the following:  Mobilization of joints and soft tissues, manipulation, manual lymphatic drainage, and/or manual traction.    Supine:  Ut, lev scap DTM  CB, SO release    Inf and post GH mobs: grade III    Serratus releae    Inf mobs 1st rib L    Home Exercises   HEP: table slides (flex, abd, ER), strap IR , scap squeezes, UT stretches     Modalities   Skin check completed    Pain and DOMS reduction: hp x L shldr: pt supine.         Assessment   IE ASSESSMENT 01/06/2020: Pt is a 69 yo F experiencing L shoulder pain x 4 month after starting caring for her grand baby and sleeping at her daughter's home. She is no longer able to do her pilates routine due to pain. At evaluation, she has limited and painful motion in all planes with scapular weakness. She will benefit from PT to address these deficits in order to care for her grandchild, perform house chores, and return to pilates.     DAILY ASSESSMENT: pt with contd pain and hypomobility L shldr with increased ut, lev scap tone: needing repeated VC,TC to correct alignment with scap retract activities.  Plan   Cont PT: progress to pulleys: ROM measurements L.      Goals    Goal 1: Independent and  consistent with her HEP.   Sessions: 8      Goal 2: Improve her AROM abd to 160 to be able to reach into overhead cabinets   Sessions: 8      Goal 3: Improve her AROM flex and abd to 170 to be able to perform overh   Sessions: 16      Goal 4: Improve her SPADI score to 9/130 to be able to carry her newborn grandchild and wash with ease   Sessions: 16          Goal 5: Improve her pain level to 1/10 at worst with lifting her grandchild, washing her back, and reaching overhead   Sessions: 16      Goal 6: Improve her IR to T10 to wash her back and donn a bra with ease.    Sessions: 30                      Layne Benton, DPT        Name: Woodie Trusty Age: 69 y.o.   Date of Service: 01/21/2020  Referring Physician: Phillips Odor, MD   Date of Injury: 08/19/2019  Date Care Plan Established/Reviewed: 01/06/2020  Date Treatment Started: 01/06/2020  End of Certification Date: 03/05/2020  Sessions in Plan of Care: 16  Surgery Date: No data was found    Visit Count: 6   Diagnosis:   1. Acute pain of left shoulder        Subjective     History of Present Illness   History of Present Illness:  S: Extremely sore since last session. Been trying ice and heat    LEVEL:  2/10 L shoulder, 5/10 at worst    Social Support/Occupation      Occupation: retired      Precautions: No data was found  Allergies: Cephalosporins, Strawberry extract, Cefuroxime, Dust mite extract, Molds & smuts, Niacin, Pollen extract, and Tree extract    Objective   AROM L SHLDR APR 26  AROM BUEs  Flex R tight end-range, L 155DEG  Abd R tight end-range, L 135DEG  IR R T10, L T12 IR L 80DEG: SAYS HER SHLDR IS PULLING VS PAIN.  ER R  102, L 71DEG (supine at 90abd)    L Shoulder Evaluation 01/06/2020  MOI: unknown    DOI: Upper arm with exercise the past 3-4 mo. March 1st 2021 sleeping on futon and helping daughter with newborn.     C/O: L upper arm pain    R hand dominant    Decrease: Ice, injection,    PLOF: pilates reformer    PMHx: chronic kidney  disease    Diagnostics: xray 12/22/2019  1. No acute fracture or dislocation.  2.  Probable rotator cuff tendinopathy.     SPADI: 59/130    AROM BUEs  Flex R tight end-range, L 141  Abd R tight end-range, L 101  IR R T10, L T12  ER R 102, L 59 (supine at 90abd)    MMT BUEs  Flex R 4/5, L 2/5  Abd R 4/5, L 2/5  IR R 4/5, L 2/5  ER R 4/5, L 2/5  Post Delt 4/5  Mid Trap 3/5  Lower Trap R 3/5, L unable to assess    Special Tests:  Neers (+)  Hawkins-Kennedy (+)  Speeds (+) pain    Palpation: (+) UT, teres major/minor, pectorals                   Treatment     Therapeutic Exercises - Justified to address any of the following: develop strength, endurance, ROM and/or flexibility.   UBE LVL 5  x5 retro    UT stretches 3x17min    Doorway/corner stretches 3x4min    Prone I's 1# 3x10  Prone T's 3x10     Manual Therapy - Justified to address any of the following:  Mobilization of joints and soft tissues, manipulation, manual lymphatic drainage, and/or manual traction.    Supine:  Ut, lev scap DTM  CB, SO release  Inf and post GH mobs: grade III  Serratus releae  Inf mobs 1st rib L    S/L R: L scap release, teres major    Therapeutic Activity - Justified to address the following: activities to improve functional performance.  Seated lat pull downs Blue 3x10  Seated bilat rows bilat Rows Black 3x10    Wall pushups 3x10        Home Exercises   HEP: table slides (flex, abd, ER), strap IR , scap squeezes, UT stretches          Assessment   IE ASSESSMENT 01/06/2020: Pt is a 69 yo F experiencing L shoulder pain x 4 month after starting caring for her grand baby and sleeping at her daughter's home. She is no longer able to do her pilates routine due to pain. At evaluation, she has limited and painful motion in all planes with scapular weakness. She will benefit from PT to address these deficits in order to care for her grandchild, perform house chores, and return to pilates.     DAILY ASSESSMENT: Pt with experiencing increased  soreness since last session. Able to tolerate exercises, but c/o pain. Tightness in UT due to muscle engagement with shoulder elevation.   Plan   Next visit: pulleys, spider and wall walks       Goals    Goal 1: Independent and consistent with her HEP.   Sessions: 8      Goal 2: Improve her AROM abd to 160 to be able to reach into overhead cabinets   Sessions: 8      Goal 3: Improve her AROM flex and abd to 170  to be able to perform overh   Sessions: 16      Goal 4: Improve her SPADI score to 9/130 to be able to carry her newborn grandchild and wash with ease   Sessions: 16          Goal 5: Improve her pain level to 1/10 at worst with lifting her grandchild, washing her back, and reaching overhead   Sessions: 16      Goal 6: Improve her IR to T10 to wash her back and donn a bra with ease.    Sessions: 83                      Layne Benton, DPT        Name: Irelynn Schermerhorn Age: 69 y.o.   Date of Service: 01/21/2020  Referring Physician: Phillips Odor, MD   Date of Injury: 08/19/2019  Date Care Plan Established/Reviewed: 01/06/2020  Date Treatment Started: 01/06/2020  End of Certification Date: 03/05/2020  Sessions in Plan of Care: 16  Surgery Date: No data was found    Visit Count: 6   Diagnosis:   1. Acute pain of left shoulder        Subjective     History of Present Illness   History of Present Illness:  S: shldr is still painful reaching OH and to the side L side.  Would like revision of her exercises.    LEVEL:  1/10 L shoulder, 5/10 at worst    Social Support/Occupation      Occupation: retired      Precautions: No data was found  Allergies: Cephalosporins, Strawberry extract, Cefuroxime, Dust mite extract, Molds & smuts, Niacin, Pollen extract, and Tree extract    Objective   L Shoulder Evaluation 01/06/2020  MOI: unknown    DOI: Upper arm with exercise the past 3-4 mo. March 1st 2021 sleeping on futon and helping daughter with newborn.     C/O: L upper arm pain    R hand dominant    Decrease: Ice,  injection,    PLOF: pilates reformer    PMHx: chronic kidney disease    Diagnostics: xray 12/22/2019  1. No acute fracture or dislocation.  2.  Probable rotator cuff tendinopathy.     SPADI: 59/130    AROM BUEs  Flex R tight end-range, L 141  Abd R tight end-range, L 101  IR R T10, L T12  ER R 102, L 59 (supine at 90abd)    MMT BUEs  Flex R 4/5, L 2/5  Abd R 4/5, L 2/5  IR R 4/5, L 2/5  ER R 4/5, L 2/5  Post Delt 4/5  Mid Trap 3/5  Lower Trap R 3/5, L unable to assess    Special Tests:  Neers (+)  Hawkins-Kennedy (+)  Speeds (+) pain    Palpation: (+) UT, teres major/minor, pectorals               Treatment     Therapeutic Exercises - Justified to address any of the following: develop strength, endurance, ROM and/or flexibility.   ube retro lvl 2 x .    5 x   Seated selfmobs FF    Seated self mobs: abd    IR strap     3 x :  L ut stretching    Seated: x 30  scap retract and depression    Provided with HEP sheet.      Neuromuscular  Re-Education - Justified to address any of the following: of movement, balance, coordination, kinesthetic sense, posture and/or proprioception for sitting and/or standing activities.   NOT DONE    Manual Therapy - Justified to address any of the following:  Mobilization of joints and soft tissues, manipulation, manual lymphatic drainage, and/or manual traction.    Supine:  Ut, lev scap DTM    CB, SO release    Inf and post GH mobs: grade III    Serratus releae    Inf mobs 1st rib L    Home Exercises   HEP: table slides (flex, abd, ER), strap IR , scap squeezes, UT stretches     Modalities   Skin check completed    Pain and DOMS reduction: hp x L shldr: pt supine.         Assessment   IE ASSESSMENT 01/06/2020: Pt is a 69 yo F experiencing L shoulder pain x 4 month after starting caring for her grand baby and sleeping at her daughter's home. She is no longer able to do her pilates routine due to pain. At evaluation, she has limited and painful motion in all  planes with scapular weakness. She will benefit from PT to address these deficits in order to care for her grandchild, perform house chores, and return to pilates.     DAILY ASSESSMENT: pt with contd pain and hypomobility L shldr with increased ut, lev scap tone: needing repeated VC,TC to correct alignment with scap retract activities.  Plan   Cont PT: progress to pulleys: ROM measurements L.      Goals    Goal 1: Independent and consistent with her HEP.   Sessions: 8      Goal 2: Improve her AROM abd to 160 to be able to reach into overhead cabinets   Sessions: 8      Goal 3: Improve her AROM flex and abd to 170 to be able to perform overh   Sessions: 16      Goal 4: Improve her SPADI score to 9/130 to be able to carry her newborn grandchild and wash with ease   Sessions: 16          Goal 5: Improve her pain level to 1/10 at worst with lifting her grandchild, washing her back, and reaching overhead   Sessions: 16      Goal 6: Improve her IR to T10 to wash her back and donn a bra with ease.    Sessions: 2                      Layne Benton, DPT        Name: Iya Hamed Age: 69 y.o.   Date of Service: 01/21/2020  Referring Physician: Phillips Odor, MD   Date of Injury: 08/19/2019  Date Care Plan Established/Reviewed: 01/06/2020  Date Treatment Started: 01/06/2020  End of Certification Date: 03/05/2020  Sessions in Plan of Care: 16  Surgery Date: No data was found    Visit Count: 6   Diagnosis:   1. Acute pain of left shoulder        Subjective     History of Present Illness   History of Present Illness:  S: Extremely sore since last session. Been trying ice and heat    LEVEL:  2/10 L shoulder, 5/10 at worst    Social Support/Occupation      Occupation: retired      Precautions: No data was  found  Allergies: Cephalosporins, Strawberry extract, Cefuroxime, Dust mite extract, Molds & smuts, Niacin, Pollen extract, and Tree extract    Objective   AROM L shoulder  Flex WFL bilat  Abd WNL bilat  IR L T11  ER L 74 (supine  at 90 abd)    AROM L SHLDR APR 26  AROM BUEs  Flex R tight end-range, L 155DEG  Abd R tight end-range, L 135DEG  IR R T10, L T12 IR L 80DEG: SAYS HER SHLDR IS PULLING VS PAIN.  ER R 102, L 71DEG (supine at 90abd)    L Shoulder Evaluation 01/06/2020  MOI: unknown    DOI: Upper arm with exercise the past 3-4 mo. March 1st 2021 sleeping on futon and helping daughter with newborn.     C/O: L upper arm pain    R hand dominant    Decrease: Ice, injection,    PLOF: pilates reformer    PMHx: chronic kidney disease    Diagnostics: xray 12/22/2019  1. No acute fracture or dislocation.  2.  Probable rotator cuff tendinopathy.     SPADI: 59/130    AROM BUEs  Flex R tight end-range, L 141  Abd R tight end-range, L 101  IR R T10, L T12  ER R 102, L 59 (supine at 90abd)    MMT BUEs  Flex R 4/5, L 2/5  Abd R 4/5, L 2/5  IR R 4/5, L 2/5  ER R 4/5, L 2/5  Post Delt 4/5  Mid Trap 3/5  Lower Trap R 3/5, L unable to assess    Special Tests:  Neers (+)  Hawkins-Kennedy (+)  Speeds (+) pain    Palpation: (+) UT, teres major/minor, pectorals                   Treatment     Therapeutic Exercises - Justified to address any of the following: develop strength, endurance, ROM and/or flexibility.   UBE LVL 5  x5 retro    UT stretches 3x59min    Seated IR strap stretches 3x62min    Doorway/corner stretches 3x75min-not done    Prone I's 1# 3x10  Prone T's 3x10     Neuromuscular Re-Education - Justified to address any of the following: of movement, balance, coordination, kinesthetic sense, posture and/or proprioception for sitting and/or standing activities.   Body blade Lt 2x5min each:  At side  At 90 flex  At 90 Abd    Seated PNF sword RTB 3x10    Manual Therapy - Justified to address any of the following:  Mobilization of joints and soft tissues, manipulation, manual lymphatic drainage, and/or manual traction.    Supine:  Ut, lev scap DTM  CB, SO release  Inf and post GH mobs: grade III  Serratus releae  Inf mobs 1st rib L    S/L R: L  scap release, teres major    Therapeutic Activity - Justified to address the following: activities to improve functional performance.  Seated lat pull downs Blue 3x10  Seated bilat rows bilat Rows Black 3x10    Spider walk RTB 3x10    Seated bilat ER RTB 3x10-not done  Seated bilat T's RTB 3x10-not done      Home Exercises   HEP: table slides (flex, abd, ER), strap IR , scap squeezes, UT stretches     Modalities   Skin check completed    HP L shoulder in supine for pain x  Assessment   IE ASSESSMENT 01/06/2020: Pt is a 69 yo F experiencing L shoulder pain x 4 month after starting caring for her grand baby and sleeping at her daughter's home. She is no longer able to do her pilates routine due to pain. At evaluation, she has limited and painful motion in all planes with scapular weakness. She will benefit from PT to address these deficits in order to care for her grandchild, perform house chores, and return to pilates.     DAILY ASSESSMENT: Pt now with equal flex and ABD to her R shoulder. Still lacking full ER and IR, however progressing as well. Fatigue with new strengthening exercises, but no increase in pain.   Plan   Next visit: sleeper stretch      Goals    Goal 1: Independent and consistent with her HEP.   Sessions: 8      Goal 2: Improve her AROM abd to 160 to be able to reach into overhead cabinets   Sessions: 8      Goal 3: Improve her AROM flex and abd to 170 to be able to perform overh   Sessions: 16      Goal 4: Improve her SPADI score to 9/130 to be able to carry her newborn grandchild and wash with ease   Sessions: 16          Goal 5: Improve her pain level to 1/10 at worst with lifting her grandchild, washing her back, and reaching overhead   Sessions: 16      Goal 6: Improve her IR to T10 to wash her back and donn a bra with ease.    Sessions: 16                      Layne Benton, DPT

## 2020-01-26 ENCOUNTER — Ambulatory Visit (INDEPENDENT_AMBULATORY_CARE_PROVIDER_SITE_OTHER): Payer: BLUE CROSS/BLUE SHIELD | Admitting: Physical Therapist

## 2020-01-26 DIAGNOSIS — M25512 Pain in left shoulder: Secondary | ICD-10-CM

## 2020-01-27 ENCOUNTER — Ambulatory Visit (INDEPENDENT_AMBULATORY_CARE_PROVIDER_SITE_OTHER): Payer: BLUE CROSS/BLUE SHIELD | Admitting: Family Medicine

## 2020-01-28 ENCOUNTER — Ambulatory Visit (INDEPENDENT_AMBULATORY_CARE_PROVIDER_SITE_OTHER): Payer: BLUE CROSS/BLUE SHIELD | Admitting: Physical Therapist

## 2020-01-28 DIAGNOSIS — M25512 Pain in left shoulder: Secondary | ICD-10-CM

## 2020-01-28 NOTE — PT/OT Therapy Note (Signed)
Name: Julyssa Kyer Age: 69 y.o.   Date of Service: 01/28/2020  Referring Physician: Phillips Odor, MD   Date of Injury: 08/19/2019  Date Care Plan Established/Reviewed: 01/06/2020  Date Treatment Started: 01/06/2020  End of Certification Date: 03/05/2020  Sessions in Plan of Care: 16  Surgery Date: No data was found    Visit Count: 8   Diagnosis:   1. Acute pain of left shoulder        Subjective     History of Present Illness   History of Present Illness:  S: shldr is still painful reaching OH and to the side L side.  Would like revision of her exercises.    LEVEL:  1/10 L shoulder, 5/10 at worst    Social Support/Occupation      Occupation: retired      Precautions: No data was found  Allergies: Cephalosporins, Strawberry extract, Cefuroxime, Dust mite extract, Molds & smuts, Niacin, Pollen extract, and Tree extract    Objective   L Shoulder Evaluation 01/06/2020  MOI: unknown    DOI: Upper arm with exercise the past 3-4 mo. March 1st 2021 sleeping on futon and helping daughter with newborn.     C/O: L upper arm pain    R hand dominant    Decrease: Ice, injection,    PLOF: pilates reformer    PMHx: chronic kidney disease    Diagnostics: xray 12/22/2019  1. No acute fracture or dislocation.  2.  Probable rotator cuff tendinopathy.     SPADI: 59/130    AROM BUEs  Flex R tight end-range, L 141  Abd R tight end-range, L 101  IR R T10, L T12  ER R 102, L 59 (supine at 90abd)    MMT BUEs  Flex R 4/5, L 2/5  Abd R 4/5, L 2/5  IR R 4/5, L 2/5  ER R 4/5, L 2/5  Post Delt 4/5  Mid Trap 3/5  Lower Trap R 3/5, L unable to assess    Special Tests:  Neers (+)  Hawkins-Kennedy (+)  Speeds (+) pain    Palpation: (+) UT, teres major/minor, pectorals               Treatment     Therapeutic Exercises - Justified to address any of the following: develop strength, endurance, ROM and/or flexibility.   ube retro lvl 2 x .    5 x   Seated selfmobs FF    Seated self mobs: abd    IR strap     3 x :  L ut  stretching    Seated: x 30  scap retract and depression    Provided with HEP sheet.      Neuromuscular Re-Education - Justified to address any of the following: of movement, balance, coordination, kinesthetic sense, posture and/or proprioception for sitting and/or standing activities.   NOT DONE    Manual Therapy - Justified to address any of the following:  Mobilization of joints and soft tissues, manipulation, manual lymphatic drainage, and/or manual traction.    Supine:  Ut, lev scap DTM    CB, SO release    Inf and post GH mobs: grade III    Serratus releae    Inf mobs 1st rib L    Home Exercises   HEP: table slides (flex, abd, ER), strap IR , scap squeezes, UT stretches     Modalities   Skin check completed    Pain and DOMS reduction: hp x  L shldr: pt supine.       ---      ---   Total Time   Timed Minutes  52 minutes   Untimed Minutes  15 minutes   Total Time  67 minutes        Assessment   IE ASSESSMENT 01/06/2020: Pt is a 69 yo F experiencing L shoulder pain x 4 month after starting caring for her grand baby and sleeping at her daughter's home. She is no longer able to do her pilates routine due to pain. At evaluation, she has limited and painful motion in all planes with scapular weakness. She will benefit from PT to address these deficits in order to care for her grandchild, perform house chores, and return to pilates.     DAILY ASSESSMENT: pt with contd pain and hypomobility L shldr with increased ut, lev scap tone: needing repeated VC,TC to correct alignment with scap retract activities.  Plan   Cont PT: progress to pulleys: ROM measurements L.      Goals    Goal 1: Independent and consistent with her HEP.   Sessions: 8      Goal 2: Improve her AROM abd to 160 to be able to reach into overhead cabinets   Sessions: 8      Goal 3: Improve her AROM flex and abd to 170 to be able to perform overh   Sessions: 16      Goal 4: Improve her SPADI score to 9/130 to be able to carry her newborn grandchild  and wash with ease   Sessions: 16          Goal 5: Improve her pain level to 1/10 at worst with lifting her grandchild, washing her back, and reaching overhead   Sessions: 16      Goal 6: Improve her IR to T10 to wash her back and donn a bra with ease.    Sessions: 16                      Bud Face, PT        Name: Etoile Looman Age: 69 y.o.   Date of Service: 01/28/2020  Referring Physician: Phillips Odor, MD   Date of Injury: 08/19/2019  Date Care Plan Established/Reviewed: 01/06/2020  Date Treatment Started: 01/06/2020  End of Certification Date: 03/05/2020  Sessions in Plan of Care: 16  Surgery Date: No data was found    Visit Count: 8   Diagnosis:   1. Acute pain of left shoulder        Subjective     History of Present Illness   History of Present Illness:  S: Extremely sore since last session. Been trying ice and heat    LEVEL:  2/10 L shoulder, 5/10 at worst    Social Support/Occupation      Occupation: retired      Precautions: No data was found  Allergies: Cephalosporins, Strawberry extract, Cefuroxime, Dust mite extract, Molds & smuts, Niacin, Pollen extract, and Tree extract    Objective   AROM L SHLDR APR 26  AROM BUEs  Flex R tight end-range, L 155DEG  Abd R tight end-range, L 135DEG  IR R T10, L T12 IR L 80DEG: SAYS HER SHLDR IS PULLING VS PAIN.  ER R 102, L 71DEG (supine at 90abd)    L Shoulder Evaluation 01/06/2020  MOI: unknown    DOI: Upper arm with exercise the past 3-4 mo.  March 1st 2021 sleeping on futon and helping daughter with newborn.     C/O: L upper arm pain    R hand dominant    Decrease: Ice, injection,    PLOF: pilates reformer    PMHx: chronic kidney disease    Diagnostics: xray 12/22/2019  1. No acute fracture or dislocation.  2.  Probable rotator cuff tendinopathy.     SPADI: 59/130    AROM BUEs  Flex R tight end-range, L 141  Abd R tight end-range, L 101  IR R T10, L T12  ER R 102, L 59 (supine at 90abd)    MMT BUEs  Flex R 4/5, L 2/5  Abd R 4/5, L 2/5  IR R 4/5, L  2/5  ER R 4/5, L 2/5  Post Delt 4/5  Mid Trap 3/5  Lower Trap R 3/5, L unable to assess    Special Tests:  Neers (+)  Hawkins-Kennedy (+)  Speeds (+) pain    Palpation: (+) UT, teres major/minor, pectorals                   Treatment     Therapeutic Exercises - Justified to address any of the following: develop strength, endurance, ROM and/or flexibility.   UBE LVL 5  x5 retro    UT stretches 3x54min    Doorway/corner stretches 3x38min    Prone I's 1# 3x10  Prone T's 3x10     Manual Therapy - Justified to address any of the following:  Mobilization of joints and soft tissues, manipulation, manual lymphatic drainage, and/or manual traction.    Supine:  Ut, lev scap DTM  CB, SO release  Inf and post GH mobs: grade III  Serratus releae  Inf mobs 1st rib L    S/L R: L scap release, teres major    Therapeutic Activity - Justified to address the following: activities to improve functional performance.  Seated lat pull downs Blue 3x10  Seated bilat rows bilat Rows Black 3x10    Wall pushups 3x10        Home Exercises   HEP: table slides (flex, abd, ER), strap IR , scap squeezes, UT stretches        ---      ---   Total Time   Timed Minutes  52 minutes   Untimed Minutes  15 minutes   Total Time  67 minutes        Assessment   IE ASSESSMENT 01/06/2020: Pt is a 69 yo F experiencing L shoulder pain x 4 month after starting caring for her grand baby and sleeping at her daughter's home. She is no longer able to do her pilates routine due to pain. At evaluation, she has limited and painful motion in all planes with scapular weakness. She will benefit from PT to address these deficits in order to care for her grandchild, perform house chores, and return to pilates.     DAILY ASSESSMENT: Pt with experiencing increased soreness since last session. Able to tolerate exercises, but c/o pain. Tightness in UT due to muscle engagement with shoulder elevation.   Plan   Next visit: pulleys, spider and wall walks       Goals    Goal 1:  Independent and consistent with her HEP.   Sessions: 8      Goal 2: Improve her AROM abd to 160 to be able to reach into overhead cabinets   Sessions: 8      Goal  3: Improve her AROM flex and abd to 170 to be able to perform overh   Sessions: 16      Goal 4: Improve her SPADI score to 9/130 to be able to carry her newborn grandchild and wash with ease   Sessions: 16          Goal 5: Improve her pain level to 1/10 at worst with lifting her grandchild, washing her back, and reaching overhead   Sessions: 16      Goal 6: Improve her IR to T10 to wash her back and donn a bra with ease.    Sessions: 16                      Bud Face, PT        Name: Iysis Germain Age: 69 y.o.   Date of Service: 01/28/2020  Referring Physician: Phillips Odor, MD   Date of Injury: 08/19/2019  Date Care Plan Established/Reviewed: 01/06/2020  Date Treatment Started: 01/06/2020  End of Certification Date: 03/05/2020  Sessions in Plan of Care: 16  Surgery Date: No data was found    Visit Count: 8   Diagnosis:   1. Acute pain of left shoulder        Subjective     History of Present Illness   History of Present Illness:  S: shldr is still painful reaching OH and to the side L side.  Would like revision of her exercises.    LEVEL:  1/10 L shoulder, 5/10 at worst    Social Support/Occupation      Occupation: retired      Precautions: No data was found  Allergies: Cephalosporins, Strawberry extract, Cefuroxime, Dust mite extract, Molds & smuts, Niacin, Pollen extract, and Tree extract    Objective   L Shoulder Evaluation 01/06/2020  MOI: unknown    DOI: Upper arm with exercise the past 3-4 mo. March 1st 2021 sleeping on futon and helping daughter with newborn.     C/O: L upper arm pain    R hand dominant    Decrease: Ice, injection,    PLOF: pilates reformer    PMHx: chronic kidney disease    Diagnostics: xray 12/22/2019  1. No acute fracture or dislocation.  2.  Probable rotator cuff tendinopathy.     SPADI: 59/130    AROM BUEs  Flex R  tight end-range, L 141  Abd R tight end-range, L 101  IR R T10, L T12  ER R 102, L 59 (supine at 90abd)    MMT BUEs  Flex R 4/5, L 2/5  Abd R 4/5, L 2/5  IR R 4/5, L 2/5  ER R 4/5, L 2/5  Post Delt 4/5  Mid Trap 3/5  Lower Trap R 3/5, L unable to assess    Special Tests:  Neers (+)  Hawkins-Kennedy (+)  Speeds (+) pain    Palpation: (+) UT, teres major/minor, pectorals               Treatment     Therapeutic Exercises - Justified to address any of the following: develop strength, endurance, ROM and/or flexibility.   ube retro lvl 2 x .    5 x   Seated selfmobs FF    Seated self mobs: abd    IR strap     3 x :  L ut stretching    Seated: x 30  scap retract and depression    Provided  with HEP sheet.      Neuromuscular Re-Education - Justified to address any of the following: of movement, balance, coordination, kinesthetic sense, posture and/or proprioception for sitting and/or standing activities.   NOT DONE    Manual Therapy - Justified to address any of the following:  Mobilization of joints and soft tissues, manipulation, manual lymphatic drainage, and/or manual traction.    Supine:  Ut, lev scap DTM    CB, SO release    Inf and post GH mobs: grade III    Serratus releae    Inf mobs 1st rib L    Home Exercises   HEP: table slides (flex, abd, ER), strap IR , scap squeezes, UT stretches     Modalities   Skin check completed    Pain and DOMS reduction: hp x L shldr: pt supine.       ---      ---   Total Time   Timed Minutes  52 minutes   Untimed Minutes  15 minutes   Total Time  67 minutes        Assessment   IE ASSESSMENT 01/06/2020: Pt is a 69 yo F experiencing L shoulder pain x 4 month after starting caring for her grand baby and sleeping at her daughter's home. She is no longer able to do her pilates routine due to pain. At evaluation, she has limited and painful motion in all planes with scapular weakness. She will benefit from PT to address these deficits in order to care for her  grandchild, perform house chores, and return to pilates.     DAILY ASSESSMENT: pt with contd pain and hypomobility L shldr with increased ut, lev scap tone: needing repeated VC,TC to correct alignment with scap retract activities.  Plan   Cont PT: progress to pulleys: ROM measurements L.      Goals    Goal 1: Independent and consistent with her HEP.   Sessions: 8      Goal 2: Improve her AROM abd to 160 to be able to reach into overhead cabinets   Sessions: 8      Goal 3: Improve her AROM flex and abd to 170 to be able to perform overh   Sessions: 16      Goal 4: Improve her SPADI score to 9/130 to be able to carry her newborn grandchild and wash with ease   Sessions: 16          Goal 5: Improve her pain level to 1/10 at worst with lifting her grandchild, washing her back, and reaching overhead   Sessions: 16      Goal 6: Improve her IR to T10 to wash her back and donn a bra with ease.    Sessions: 16                      Bud Face, PT        Name: Shamieka Gullo Age: 69 y.o.   Date of Service: 01/28/2020  Referring Physician: Phillips Odor, MD   Date of Injury: 08/19/2019  Date Care Plan Established/Reviewed: 01/06/2020  Date Treatment Started: 01/06/2020  End of Certification Date: 03/05/2020  Sessions in Plan of Care: 16  Surgery Date: No data was found    Visit Count: 8   Diagnosis:   1. Acute pain of left shoulder        Subjective     History of Present Illness   History of  Present Illness:  S: Extremely sore since last session. Been trying ice and heat    LEVEL:  2/10 L shoulder, 5/10 at worst    Social Support/Occupation      Occupation: retired      Precautions: No data was found  Allergies: Cephalosporins, Strawberry extract, Cefuroxime, Dust mite extract, Molds & smuts, Niacin, Pollen extract, and Tree extract    Objective   AROM L SHLDR APR 26  AROM BUEs  Flex R tight end-range, L 155DEG  Abd R tight end-range, L 135DEG  IR R T10, L T12 IR L 80DEG: SAYS HER SHLDR IS PULLING VS PAIN.  ER R 102, L 71DEG  (supine at 90abd)    L Shoulder Evaluation 01/06/2020  MOI: unknown    DOI: Upper arm with exercise the past 3-4 mo. March 1st 2021 sleeping on futon and helping daughter with newborn.     C/O: L upper arm pain    R hand dominant    Decrease: Ice, injection,    PLOF: pilates reformer    PMHx: chronic kidney disease    Diagnostics: xray 12/22/2019  1. No acute fracture or dislocation.  2.  Probable rotator cuff tendinopathy.     SPADI: 59/130    AROM BUEs  Flex R tight end-range, L 141  Abd R tight end-range, L 101  IR R T10, L T12  ER R 102, L 59 (supine at 90abd)    MMT BUEs  Flex R 4/5, L 2/5  Abd R 4/5, L 2/5  IR R 4/5, L 2/5  ER R 4/5, L 2/5  Post Delt 4/5  Mid Trap 3/5  Lower Trap R 3/5, L unable to assess    Special Tests:  Neers (+)  Hawkins-Kennedy (+)  Speeds (+) pain    Palpation: (+) UT, teres major/minor, pectorals                   Treatment     Therapeutic Exercises - Justified to address any of the following: develop strength, endurance, ROM and/or flexibility.   UBE LVL 5  x5 retro    UT stretches 3x12min    Doorway/corner stretches 3x32min    Prone I's 1# 3x10  Prone T's 3x10     Manual Therapy - Justified to address any of the following:  Mobilization of joints and soft tissues, manipulation, manual lymphatic drainage, and/or manual traction.    Supine:  Ut, lev scap DTM  CB, SO release  Inf and post GH mobs: grade III  Serratus releae  Inf mobs 1st rib L    S/L R: L scap release, teres major    Therapeutic Activity - Justified to address the following: activities to improve functional performance.  Seated lat pull downs Blue 3x10  Seated bilat rows bilat Rows Black 3x10    Wall pushups 3x10        Home Exercises   HEP: table slides (flex, abd, ER), strap IR , scap squeezes, UT stretches        ---      ---   Total Time   Timed Minutes  52 minutes   Untimed Minutes  15 minutes   Total Time  67 minutes        Assessment   IE ASSESSMENT 01/06/2020: Pt is a 69 yo F experiencing L shoulder  pain x 4 month after starting caring for her grand baby and sleeping at her daughter's home. She is no longer able to  do her pilates routine due to pain. At evaluation, she has limited and painful motion in all planes with scapular weakness. She will benefit from PT to address these deficits in order to care for her grandchild, perform house chores, and return to pilates.     DAILY ASSESSMENT: Pt with experiencing increased soreness since last session. Able to tolerate exercises, but c/o pain. Tightness in UT due to muscle engagement with shoulder elevation.   Plan   Next visit: pulleys, spider and wall walks       Goals    Goal 1: Independent and consistent with her HEP.   Sessions: 8      Goal 2: Improve her AROM abd to 160 to be able to reach into overhead cabinets   Sessions: 8      Goal 3: Improve her AROM flex and abd to 170 to be able to perform overh   Sessions: 16      Goal 4: Improve her SPADI score to 9/130 to be able to carry her newborn grandchild and wash with ease   Sessions: 16          Goal 5: Improve her pain level to 1/10 at worst with lifting her grandchild, washing her back, and reaching overhead   Sessions: 16      Goal 6: Improve her IR to T10 to wash her back and donn a bra with ease.    Sessions: 16                      Bud Face, PT        Name: Taneasha Fuqua Age: 69 y.o.   Date of Service: 01/28/2020  Referring Physician: Phillips Odor, MD   Date of Injury: 08/19/2019  Date Care Plan Established/Reviewed: 01/06/2020  Date Treatment Started: 01/06/2020  End of Certification Date: 03/05/2020  Sessions in Plan of Care: 16  Surgery Date: No data was found    Visit Count: 8   Diagnosis:   1. Acute pain of left shoulder        Subjective     History of Present Illness   History of Present Illness:  S: shldr is still painful reaching OH and to the side L side.  Would like revision of her exercises.    LEVEL:  1/10 L shoulder, 5/10 at worst    Social Support/Occupation      Occupation:  retired      Precautions: No data was found  Allergies: Cephalosporins, Strawberry extract, Cefuroxime, Dust mite extract, Molds & smuts, Niacin, Pollen extract, and Tree extract    Objective   L Shoulder Evaluation 01/06/2020  MOI: unknown    DOI: Upper arm with exercise the past 3-4 mo. March 1st 2021 sleeping on futon and helping daughter with newborn.     C/O: L upper arm pain    R hand dominant    Decrease: Ice, injection,    PLOF: pilates reformer    PMHx: chronic kidney disease    Diagnostics: xray 12/22/2019  1. No acute fracture or dislocation.  2.  Probable rotator cuff tendinopathy.     SPADI: 59/130    AROM BUEs  Flex R tight end-range, L 141  Abd R tight end-range, L 101  IR R T10, L T12  ER R 102, L 59 (supine at 90abd)    MMT BUEs  Flex R 4/5, L 2/5  Abd R 4/5, L 2/5  IR R 4/5, L  2/5  ER R 4/5, L 2/5  Post Delt 4/5  Mid Trap 3/5  Lower Trap R 3/5, L unable to assess    Special Tests:  Neers (+)  Hawkins-Kennedy (+)  Speeds (+) pain    Palpation: (+) UT, teres major/minor, pectorals               Treatment     Therapeutic Exercises - Justified to address any of the following: develop strength, endurance, ROM and/or flexibility.   ube retro lvl 2 x .    5 x   Seated selfmobs FF    Seated self mobs: abd    IR strap     3 x :  L ut stretching    Seated: x 30  scap retract and depression    Provided with HEP sheet.      Neuromuscular Re-Education - Justified to address any of the following: of movement, balance, coordination, kinesthetic sense, posture and/or proprioception for sitting and/or standing activities.   NOT DONE    Manual Therapy - Justified to address any of the following:  Mobilization of joints and soft tissues, manipulation, manual lymphatic drainage, and/or manual traction.    Supine:  Ut, lev scap DTM    CB, SO release    Inf and post GH mobs: grade III    Serratus releae    Inf mobs 1st rib L    Home Exercises   HEP: table slides (flex, abd, ER), strap IR ,  scap squeezes, UT stretches     Modalities   Skin check completed    Pain and DOMS reduction: hp x L shldr: pt supine.       ---      ---   Total Time   Timed Minutes  52 minutes   Untimed Minutes  15 minutes   Total Time  67 minutes        Assessment   IE ASSESSMENT 01/06/2020: Pt is a 69 yo F experiencing L shoulder pain x 4 month after starting caring for her grand baby and sleeping at her daughter's home. She is no longer able to do her pilates routine due to pain. At evaluation, she has limited and painful motion in all planes with scapular weakness. She will benefit from PT to address these deficits in order to care for her grandchild, perform house chores, and return to pilates.     DAILY ASSESSMENT: pt with contd pain and hypomobility L shldr with increased ut, lev scap tone: needing repeated VC,TC to correct alignment with scap retract activities.  Plan   Cont PT: progress to pulleys: ROM measurements L.      Goals    Goal 1: Independent and consistent with her HEP.   Sessions: 8      Goal 2: Improve her AROM abd to 160 to be able to reach into overhead cabinets   Sessions: 8      Goal 3: Improve her AROM flex and abd to 170 to be able to perform overh   Sessions: 16      Goal 4: Improve her SPADI score to 9/130 to be able to carry her newborn grandchild and wash with ease   Sessions: 16          Goal 5: Improve her pain level to 1/10 at worst with lifting her grandchild, washing her back, and reaching overhead   Sessions: 16      Goal 6: Improve her IR to T10  to wash her back and donn a bra with ease.    Sessions: 16                      Bud Face, PT        Name: Carmelia Tiner Age: 69 y.o.   Date of Service: 01/28/2020  Referring Physician: Phillips Odor, MD   Date of Injury: 08/19/2019  Date Care Plan Established/Reviewed: 01/06/2020  Date Treatment Started: 01/06/2020  End of Certification Date: 03/05/2020  Sessions in Plan of Care: 16  Surgery Date: No data was found    Visit Count: 8    Diagnosis:   1. Acute pain of left shoulder        Subjective     History of Present Illness   History of Present Illness:  S: Extremely sore since last session. Been trying ice and heat    LEVEL:  2/10 L shoulder, 5/10 at worst    Social Support/Occupation      Occupation: retired      Precautions: No data was found  Allergies: Cephalosporins, Strawberry extract, Cefuroxime, Dust mite extract, Molds & smuts, Niacin, Pollen extract, and Tree extract    Objective   AROM L shoulder  Flex WFL bilat  Abd WNL bilat  IR L T11  ER L 74 (supine at 90 abd)    AROM L SHLDR APR 26  AROM BUEs  Flex R tight end-range, L 155DEG  Abd R tight end-range, L 135DEG  IR R T10, L T12 IR L 80DEG: SAYS HER SHLDR IS PULLING VS PAIN.  ER R 102, L 71DEG (supine at 90abd)    L Shoulder Evaluation 01/06/2020  MOI: unknown    DOI: Upper arm with exercise the past 3-4 mo. March 1st 2021 sleeping on futon and helping daughter with newborn.     C/O: L upper arm pain    R hand dominant    Decrease: Ice, injection,    PLOF: pilates reformer    PMHx: chronic kidney disease    Diagnostics: xray 12/22/2019  1. No acute fracture or dislocation.  2.  Probable rotator cuff tendinopathy.     SPADI: 59/130    AROM BUEs  Flex R tight end-range, L 141  Abd R tight end-range, L 101  IR R T10, L T12  ER R 102, L 59 (supine at 90abd)    MMT BUEs  Flex R 4/5, L 2/5  Abd R 4/5, L 2/5  IR R 4/5, L 2/5  ER R 4/5, L 2/5  Post Delt 4/5  Mid Trap 3/5  Lower Trap R 3/5, L unable to assess    Special Tests:  Neers (+)  Hawkins-Kennedy (+)  Speeds (+) pain    Palpation: (+) UT, teres major/minor, pectorals                   Treatment     Therapeutic Exercises - Justified to address any of the following: develop strength, endurance, ROM and/or flexibility.   UBE LVL 5  x5 retro    UT stretches 3x66min    Seated IR strap stretches 3x57min    Doorway/corner stretches 3x75min-not done    Prone I's 1# 3x10  Prone T's 3x10     Neuromuscular Re-Education - Justified  to address any of the following: of movement, balance, coordination, kinesthetic sense, posture and/or proprioception for sitting and/or standing activities.   Body blade Lt 2x27min each:  At side  At 90 flex  At 90 Abd    Seated PNF sword RTB 3x10    Manual Therapy - Justified to address any of the following:  Mobilization of joints and soft tissues, manipulation, manual lymphatic drainage, and/or manual traction.    Supine:  Ut, lev scap DTM  CB, SO release  Inf and post GH mobs: grade III  Serratus releae  Inf mobs 1st rib L    S/L R: L scap release, teres major    Therapeutic Activity - Justified to address the following: activities to improve functional performance.  Seated lat pull downs Blue 3x10  Seated bilat rows bilat Rows Black 3x10    Spider walk RTB 3x10    Seated bilat ER RTB 3x10-not done  Seated bilat T's RTB 3x10-not done      Home Exercises   HEP: table slides (flex, abd, ER), strap IR , scap squeezes, UT stretches     Modalities   Skin check completed    HP L shoulder in supine for pain x       ---      ---   Total Time   Timed Minutes  52 minutes   Untimed Minutes  15 minutes   Total Time  67 minutes        Assessment   IE ASSESSMENT 01/06/2020: Pt is a 69 yo F experiencing L shoulder pain x 4 month after starting caring for her grand baby and sleeping at her daughter's home. She is no longer able to do her pilates routine due to pain. At evaluation, she has limited and painful motion in all planes with scapular weakness. She will benefit from PT to address these deficits in order to care for her grandchild, perform house chores, and return to pilates.     DAILY ASSESSMENT: Pt now with equal flex and ABD to her R shoulder. Still lacking full ER and IR, however progressing as well. Fatigue with new strengthening exercises, but no increase in pain.   Plan   Next visit: sleeper stretch      Goals    Goal 1: Independent and consistent with her HEP.   Sessions: 8      Goal 2: Improve her AROM  abd to 160 to be able to reach into overhead cabinets   Sessions: 8      Goal 3: Improve her AROM flex and abd to 170 to be able to perform overh   Sessions: 16      Goal 4: Improve her SPADI score to 9/130 to be able to carry her newborn grandchild and wash with ease   Sessions: 16          Goal 5: Improve her pain level to 1/10 at worst with lifting her grandchild, washing her back, and reaching overhead   Sessions: 16      Goal 6: Improve her IR to T10 to wash her back and donn a bra with ease.    Sessions: 16                      Bud Face, PT        Name: Kissy Cielo Age: 69 y.o.   Date of Service: 01/28/2020  Referring Physician: Phillips Odor, MD   Date of Injury: 08/19/2019  Date Care Plan Established/Reviewed: 01/06/2020  Date Treatment Started: 01/06/2020  End of Certification Date: 03/05/2020  Sessions in Plan of Care: 16  Surgery Date: No data  was found    Visit Count: 7   Diagnosis:   1. Acute pain of left shoulder        Subjective     History of Present Illness   History of Present Illness:  S: shldr is still painful reaching OH and to the side L side.  Would like revision of her exercises.    LEVEL:  1/10 L shoulder, 5/10 at worst    Social Support/Occupation      Occupation: retired      Precautions: No data was found  Allergies: Cephalosporins, Strawberry extract, Cefuroxime, Dust mite extract, Molds & smuts, Niacin, Pollen extract, and Tree extract    Objective   L Shoulder Evaluation 01/06/2020  MOI: unknown    DOI: Upper arm with exercise the past 3-4 mo. March 1st 2021 sleeping on futon and helping daughter with newborn.     C/O: L upper arm pain    R hand dominant    Decrease: Ice, injection,    PLOF: pilates reformer    PMHx: chronic kidney disease    Diagnostics: xray 12/22/2019  1. No acute fracture or dislocation.  2.  Probable rotator cuff tendinopathy.     SPADI: 59/130    AROM BUEs  Flex R tight end-range, L 141  Abd R tight end-range, L 101  IR R T10, L T12  ER R 102, L  59 (supine at 90abd)    MMT BUEs  Flex R 4/5, L 2/5  Abd R 4/5, L 2/5  IR R 4/5, L 2/5  ER R 4/5, L 2/5  Post Delt 4/5  Mid Trap 3/5  Lower Trap R 3/5, L unable to assess    Special Tests:  Neers (+)  Hawkins-Kennedy (+)  Speeds (+) pain    Palpation: (+) UT, teres major/minor, pectorals               Treatment     Therapeutic Exercises - Justified to address any of the following: develop strength, endurance, ROM and/or flexibility.   ube retro lvl 2 x .    5 x   Seated selfmobs FF    Seated self mobs: abd    IR strap     3 x :  L ut stretching    Seated: x 30  scap retract and depression    Provided with HEP sheet.      Neuromuscular Re-Education - Justified to address any of the following: of movement, balance, coordination, kinesthetic sense, posture and/or proprioception for sitting and/or standing activities.   NOT DONE    Manual Therapy - Justified to address any of the following:  Mobilization of joints and soft tissues, manipulation, manual lymphatic drainage, and/or manual traction.    Supine:  Ut, lev scap DTM    CB, SO release    Inf and post GH mobs: grade III    Serratus releae    Inf mobs 1st rib L    Home Exercises   HEP: table slides (flex, abd, ER), strap IR , scap squeezes, UT stretches     Modalities   Skin check completed    Pain and DOMS reduction: hp x L shldr: pt supine.         Assessment   IE ASSESSMENT 01/06/2020: Pt is a 69 yo F experiencing L shoulder pain x 4 month after starting caring for her grand baby and sleeping at her daughter's home. She is no longer able to do her pilates  routine due to pain. At evaluation, she has limited and painful motion in all planes with scapular weakness. She will benefit from PT to address these deficits in order to care for her grandchild, perform house chores, and return to pilates.     DAILY ASSESSMENT: pt with contd pain and hypomobility L shldr with increased ut, lev scap tone: needing repeated VC,TC to correct alignment  with scap retract activities.  Plan   Cont PT: progress to pulleys: ROM measurements L.      Goals    Goal 1: Independent and consistent with her HEP.   Sessions: 8      Goal 2: Improve her AROM abd to 160 to be able to reach into overhead cabinets   Sessions: 8      Goal 3: Improve her AROM flex and abd to 170 to be able to perform overh   Sessions: 16      Goal 4: Improve her SPADI score to 9/130 to be able to carry her newborn grandchild and wash with ease   Sessions: 16          Goal 5: Improve her pain level to 1/10 at worst with lifting her grandchild, washing her back, and reaching overhead   Sessions: 16      Goal 6: Improve her IR to T10 to wash her back and donn a bra with ease.    Sessions: 16                      Bud Face, PT        Name: Heike Pounds Age: 69 y.o.   Date of Service: 01/28/2020  Referring Physician: Phillips Odor, MD   Date of Injury: 08/19/2019  Date Care Plan Established/Reviewed: 01/06/2020  Date Treatment Started: 01/06/2020  End of Certification Date: 03/05/2020  Sessions in Plan of Care: 16  Surgery Date: No data was found    Visit Count: 7   Diagnosis:   1. Acute pain of left shoulder        Subjective     History of Present Illness   History of Present Illness:  S: Extremely sore since last session. Been trying ice and heat    LEVEL:  2/10 L shoulder, 5/10 at worst    Social Support/Occupation      Occupation: retired      Precautions: No data was found  Allergies: Cephalosporins, Strawberry extract, Cefuroxime, Dust mite extract, Molds & smuts, Niacin, Pollen extract, and Tree extract    Objective   AROM L SHLDR APR 26  AROM BUEs  Flex R tight end-range, L 155DEG  Abd R tight end-range, L 135DEG  IR R T10, L T12 IR L 80DEG: SAYS HER SHLDR IS PULLING VS PAIN.  ER R 102, L 71DEG (supine at 90abd)    L Shoulder Evaluation 01/06/2020  MOI: unknown    DOI: Upper arm with exercise the past 3-4 mo. March 1st 2021 sleeping on futon and helping daughter with newborn.     C/O: L  upper arm pain    R hand dominant    Decrease: Ice, injection,    PLOF: pilates reformer    PMHx: chronic kidney disease    Diagnostics: xray 12/22/2019  1. No acute fracture or dislocation.  2.  Probable rotator cuff tendinopathy.     SPADI: 59/130    AROM BUEs  Flex R tight end-range, L 141  Abd R tight end-range, L 101  IR R T10, L T12  ER R 102, L 59 (supine at 90abd)    MMT BUEs  Flex R 4/5, L 2/5  Abd R 4/5, L 2/5  IR R 4/5, L 2/5  ER R 4/5, L 2/5  Post Delt 4/5  Mid Trap 3/5  Lower Trap R 3/5, L unable to assess    Special Tests:  Neers (+)  Hawkins-Kennedy (+)  Speeds (+) pain    Palpation: (+) UT, teres major/minor, pectorals                   Treatment     Therapeutic Exercises - Justified to address any of the following: develop strength, endurance, ROM and/or flexibility.   UBE LVL 5  x5 retro    UT stretches 3x81min    Doorway/corner stretches 3x41min    Prone I's 1# 3x10  Prone T's 3x10     Manual Therapy - Justified to address any of the following:  Mobilization of joints and soft tissues, manipulation, manual lymphatic drainage, and/or manual traction.    Supine:  Ut, lev scap DTM  CB, SO release  Inf and post GH mobs: grade III  Serratus releae  Inf mobs 1st rib L    S/L R: L scap release, teres major    Therapeutic Activity - Justified to address the following: activities to improve functional performance.  Seated lat pull downs Blue 3x10  Seated bilat rows bilat Rows Black 3x10    Wall pushups 3x10        Home Exercises   HEP: table slides (flex, abd, ER), strap IR , scap squeezes, UT stretches          Assessment   IE ASSESSMENT 01/06/2020: Pt is a 69 yo F experiencing L shoulder pain x 4 month after starting caring for her grand baby and sleeping at her daughter's home. She is no longer able to do her pilates routine due to pain. At evaluation, she has limited and painful motion in all planes with scapular weakness. She will benefit from PT to address these deficits in order to care  for her grandchild, perform house chores, and return to pilates.     DAILY ASSESSMENT: Pt with experiencing increased soreness since last session. Able to tolerate exercises, but c/o pain. Tightness in UT due to muscle engagement with shoulder elevation.   Plan   Next visit: pulleys, spider and wall walks       Goals    Goal 1: Independent and consistent with her HEP.   Sessions: 8      Goal 2: Improve her AROM abd to 160 to be able to reach into overhead cabinets   Sessions: 8      Goal 3: Improve her AROM flex and abd to 170 to be able to perform overh   Sessions: 16      Goal 4: Improve her SPADI score to 9/130 to be able to carry her newborn grandchild and wash with ease   Sessions: 16          Goal 5: Improve her pain level to 1/10 at worst with lifting her grandchild, washing her back, and reaching overhead   Sessions: 16      Goal 6: Improve her IR to T10 to wash her back and donn a bra with ease.    Sessions: 16  Bud Face, PT        Name: Stacyann Mcconaughy Age: 69 y.o.   Date of Service: 01/28/2020  Referring Physician: Phillips Odor, MD   Date of Injury: 08/19/2019  Date Care Plan Established/Reviewed: 01/06/2020  Date Treatment Started: 01/06/2020  End of Certification Date: 03/05/2020  Sessions in Plan of Care: 16  Surgery Date: No data was found    Visit Count: 7   Diagnosis:   1. Acute pain of left shoulder        Subjective     History of Present Illness   History of Present Illness:  S: shldr is still painful reaching OH and to the side L side.  Would like revision of her exercises.    LEVEL:  1/10 L shoulder, 5/10 at worst    Social Support/Occupation      Occupation: retired      Precautions: No data was found  Allergies: Cephalosporins, Strawberry extract, Cefuroxime, Dust mite extract, Molds & smuts, Niacin, Pollen extract, and Tree extract    Objective   L Shoulder Evaluation 01/06/2020  MOI: unknown    DOI: Upper arm with exercise the past 3-4 mo. March 1st 2021 sleeping on  futon and helping daughter with newborn.     C/O: L upper arm pain    R hand dominant    Decrease: Ice, injection,    PLOF: pilates reformer    PMHx: chronic kidney disease    Diagnostics: xray 12/22/2019  1. No acute fracture or dislocation.  2.  Probable rotator cuff tendinopathy.     SPADI: 59/130    AROM BUEs  Flex R tight end-range, L 141  Abd R tight end-range, L 101  IR R T10, L T12  ER R 102, L 59 (supine at 90abd)    MMT BUEs  Flex R 4/5, L 2/5  Abd R 4/5, L 2/5  IR R 4/5, L 2/5  ER R 4/5, L 2/5  Post Delt 4/5  Mid Trap 3/5  Lower Trap R 3/5, L unable to assess    Special Tests:  Neers (+)  Hawkins-Kennedy (+)  Speeds (+) pain    Palpation: (+) UT, teres major/minor, pectorals               Treatment     Therapeutic Exercises - Justified to address any of the following: develop strength, endurance, ROM and/or flexibility.   ube retro lvl 2 x .    5 x   Seated selfmobs FF    Seated self mobs: abd    IR strap     3 x :  L ut stretching    Seated: x 30  scap retract and depression    Provided with HEP sheet.      Neuromuscular Re-Education - Justified to address any of the following: of movement, balance, coordination, kinesthetic sense, posture and/or proprioception for sitting and/or standing activities.   NOT DONE    Manual Therapy - Justified to address any of the following:  Mobilization of joints and soft tissues, manipulation, manual lymphatic drainage, and/or manual traction.    Supine:  Ut, lev scap DTM    CB, SO release    Inf and post GH mobs: grade III    Serratus releae    Inf mobs 1st rib L    Home Exercises   HEP: table slides (flex, abd, ER), strap IR , scap squeezes, UT stretches     Modalities   Skin  check completed    Pain and DOMS reduction: hp x L shldr: pt supine.         Assessment   IE ASSESSMENT 01/06/2020: Pt is a 69 yo F experiencing L shoulder pain x 4 month after starting caring for her grand baby and sleeping at her daughter's home. She is no  longer able to do her pilates routine due to pain. At evaluation, she has limited and painful motion in all planes with scapular weakness. She will benefit from PT to address these deficits in order to care for her grandchild, perform house chores, and return to pilates.     DAILY ASSESSMENT: pt with contd pain and hypomobility L shldr with increased ut, lev scap tone: needing repeated VC,TC to correct alignment with scap retract activities.  Plan   Cont PT: progress to pulleys: ROM measurements L.      Goals    Goal 1: Independent and consistent with her HEP.   Sessions: 8      Goal 2: Improve her AROM abd to 160 to be able to reach into overhead cabinets   Sessions: 8      Goal 3: Improve her AROM flex and abd to 170 to be able to perform overh   Sessions: 16      Goal 4: Improve her SPADI score to 9/130 to be able to carry her newborn grandchild and wash with ease   Sessions: 16          Goal 5: Improve her pain level to 1/10 at worst with lifting her grandchild, washing her back, and reaching overhead   Sessions: 16      Goal 6: Improve her IR to T10 to wash her back and donn a bra with ease.    Sessions: 16                      Bud Face, PT        Name: Anysia Choi Age: 69 y.o.   Date of Service: 01/28/2020  Referring Physician: Phillips Odor, MD   Date of Injury: 08/19/2019  Date Care Plan Established/Reviewed: 01/06/2020  Date Treatment Started: 01/06/2020  End of Certification Date: 03/05/2020  Sessions in Plan of Care: 16  Surgery Date: No data was found    Visit Count: 7   Diagnosis:   1. Acute pain of left shoulder        Subjective     History of Present Illness   History of Present Illness:  S: Extremely sore since last session. Been trying ice and heat    LEVEL:  2/10 L shoulder, 5/10 at worst    Social Support/Occupation      Occupation: retired      Precautions: No data was found  Allergies: Cephalosporins, Strawberry extract, Cefuroxime, Dust mite extract, Molds & smuts, Niacin, Pollen  extract, and Tree extract    Objective   AROM L SHLDR APR 26  AROM BUEs  Flex R tight end-range, L 155DEG  Abd R tight end-range, L 135DEG  IR R T10, L T12 IR L 80DEG: SAYS HER SHLDR IS PULLING VS PAIN.  ER R 102, L 71DEG (supine at 90abd)    L Shoulder Evaluation 01/06/2020  MOI: unknown    DOI: Upper arm with exercise the past 3-4 mo. March 1st 2021 sleeping on futon and helping daughter with newborn.     C/O: L upper arm pain    R hand  dominant    Decrease: Ice, injection,    PLOF: [place]    PMHx: chronic kidney disease    Diagnostics: xray 12/22/2019  1. No acute fracture or dislocation.  2.  Probable rotator cuff tendinopathy.     SPADI: 59/130    AROM BUEs  Flex R tight end-range, L 141  Abd R tight end-range, L 101  IR R T10, L T12  ER R 102, L 59 (supine at 90abd)    MMT BUEs  Flex R 4/5, L 2/5  Abd R 4/5, L 2/5  IR R 4/5, L 2/5  ER R 4/5, L 2/5  Post Delt 4/5  Mid Trap 3/5  Lower Trap R 3/5, L unable to assess    Special Tests:  Neers (+)  Hawkins-Kennedy (+)  Speeds (+) pain    Palpation: (+) UT, teres major/minor, pectorals                   Treatment     Therapeutic Exercises - Justified to address any of the following: develop strength, endurance, ROM and/or flexibility.   UBE LVL 5  x5 retro    UT stretches 3x47min    Doorway/corner stretches 3x73min    Prone I's 1# 3x10  Prone T's 3x10     Manual Therapy - Justified to address any of the following:  Mobilization of joints and soft tissues, manipulation, manual lymphatic drainage, and/or manual traction.    Supine:  Ut, lev scap DTM  CB, SO release  Inf and post GH mobs: grade III  Serratus releae  Inf mobs 1st rib L    S/L R: L scap release, teres major    Therapeutic Activity - Justified to address the following: activities to improve functional performance.  Seated lat pull downs Blue 3x10  Seated bilat rows bilat Rows Black 3x10    Wall pushups 3x10        Home Exercises   HEP: table slides (flex, abd, ER), strap IR , scap  squeezes, UT stretches          Assessment   IE ASSESSMENT 01/06/2020: Pt is a 69 yo F experiencing L shoulder pain x 4 month after starting caring for her grand baby and sleeping at her daughter's home. She is no longer able to do her pilates routine due to pain. At evaluation, she has limited and painful motion in all planes with scapular weakness. She will benefit from PT to address these deficits in order to care for her grandchild, perform house chores, and return to pilates.     DAILY ASSESSMENT: Pt with experiencing increased soreness since last session. Able to tolerate exercises, but c/o pain. Tightness in UT due to muscle engagement with shoulder elevation.   Plan   Next visit: pulleys, spider and wall walks       Goals    Goal 1: Independent and consistent with her HEP.   Sessions: 8      Goal 2: Improve her AROM abd to 160 to be able to reach into overhead cabinets   Sessions: 8      Goal 3: Improve her AROM flex and abd to 170 to be able to perform overh   Sessions: 16      Goal 4: Improve her SPADI score to 9/130 to be able to carry her newborn grandchild and wash with ease   Sessions: 16          Goal 5: Improve her pain level  to 1/10 at worst with lifting her grandchild, washing her back, and reaching overhead   Sessions: 16      Goal 6: Improve her IR to T10 to wash her back and donn a bra with ease.    Sessions: 16                      Bud Face, PT        Name: Irmgard Rampersaud Age: 69 y.o.   Date of Service: 01/28/2020  Referring Physician: Phillips Odor, MD   Date of Injury: 08/19/2019  Date Care Plan Established/Reviewed: 01/06/2020  Date Treatment Started: 01/06/2020  End of Certification Date: 03/05/2020  Sessions in Plan of Care: 16  Surgery Date: No data was found    Visit Count: 7   Diagnosis:   1. Acute pain of left shoulder        Subjective     History of Present Illness   History of Present Illness:  S: shldr is still painful reaching OH and to the side L side.  Would like revision of  her exercises.    LEVEL:  1/10 L shoulder, 5/10 at worst    Social Support/Occupation      Occupation: retired      Precautions: No data was found  Allergies: Cephalosporins, Strawberry extract, Cefuroxime, Dust mite extract, Molds & smuts, Niacin, Pollen extract, and Tree extract    Objective   L Shoulder Evaluation 01/06/2020  MOI: unknown    DOI: Upper arm with exercise the past 3-4 mo. March 1st 2021 sleeping on futon and helping daughter with newborn.     C/O: L upper arm pain    R hand dominant    Decrease: Ice, injection,    PLOF: pilates reformer    PMHx: chronic kidney disease    Diagnostics: xray 12/22/2019  1. No acute fracture or dislocation.  2.  Probable rotator cuff tendinopathy.     SPADI: 59/130    AROM BUEs  Flex R tight end-range, L 141  Abd R tight end-range, L 101  IR R T10, L T12  ER R 102, L 59 (supine at 90abd)    MMT BUEs  Flex R 4/5, L 2/5  Abd R 4/5, L 2/5  IR R 4/5, L 2/5  ER R 4/5, L 2/5  Post Delt 4/5  Mid Trap 3/5  Lower Trap R 3/5, L unable to assess    Special Tests:  Neers (+)  Hawkins-Kennedy (+)  Speeds (+) pain    Palpation: (+) UT, teres major/minor, pectorals               Treatment     Therapeutic Exercises - Justified to address any of the following: develop strength, endurance, ROM and/or flexibility.   ube retro lvl 2 x .    5 x   Seated selfmobs FF    Seated self mobs: abd    IR strap     3 x :  L ut stretching    Seated: x 30  scap retract and depression    Provided with HEP sheet.      Neuromuscular Re-Education - Justified to address any of the following: of movement, balance, coordination, kinesthetic sense, posture and/or proprioception for sitting and/or standing activities.   NOT DONE    Manual Therapy - Justified to address any of the following:  Mobilization of joints and soft tissues, manipulation, manual lymphatic drainage, and/or manual traction.  Supine:  Ut, lev scap DTM    CB, SO release    Inf and post GH mobs: grade  III    Serratus releae    Inf mobs 1st rib L    Home Exercises   HEP: table slides (flex, abd, ER), strap IR , scap squeezes, UT stretches     Modalities   Skin check completed    Pain and DOMS reduction: hp x L shldr: pt supine.         Assessment   IE ASSESSMENT 01/06/2020: Pt is a 69 yo F experiencing L shoulder pain x 4 month after starting caring for her grand baby and sleeping at her daughter's home. She is no longer able to do her pilates routine due to pain. At evaluation, she has limited and painful motion in all planes with scapular weakness. She will benefit from PT to address these deficits in order to care for her grandchild, perform house chores, and return to pilates.     DAILY ASSESSMENT: pt with contd pain and hypomobility L shldr with increased ut, lev scap tone: needing repeated VC,TC to correct alignment with scap retract activities.  Plan   Cont PT: progress to pulleys: ROM measurements L.      Goals    Goal 1: Independent and consistent with her HEP.   Sessions: 8      Goal 2: Improve her AROM abd to 160 to be able to reach into overhead cabinets   Sessions: 8      Goal 3: Improve her AROM flex and abd to 170 to be able to perform overh   Sessions: 16      Goal 4: Improve her SPADI score to 9/130 to be able to carry her newborn grandchild and wash with ease   Sessions: 16          Goal 5: Improve her pain level to 1/10 at worst with lifting her grandchild, washing her back, and reaching overhead   Sessions: 16      Goal 6: Improve her IR to T10 to wash her back and donn a bra with ease.    Sessions: 16                      Bud Face, PT        Name: Runette Scifres Age: 69 y.o.   Date of Service: 01/28/2020  Referring Physician: Phillips Odor, MD   Date of Injury: 08/19/2019  Date Care Plan Established/Reviewed: 01/06/2020  Date Treatment Started: 01/06/2020  End of Certification Date: 03/05/2020  Sessions in Plan of Care: 16  Surgery Date: No data was found    Visit Count: 7    Diagnosis:   1. Acute pain of left shoulder        Subjective     History of Present Illness   History of Present Illness:  S: Her shldr is getting better; though exercises make her "Sore".  She is pleased with her progress.  She will be away for a few day celebrating grandchild birthday.    LEVEL:  2/10 L shoulder, 5/10 at worst    Social Support/Occupation      Occupation: retired      Precautions: No data was found  Allergies: Cephalosporins, Strawberry extract, Cefuroxime, Dust mite extract, Molds & smuts, Niacin, Pollen extract, and Tree extract    Objective   AROM L shoulder  Flex WFL bilat  Abd WNL bilat  IR L T11  ER L 74 (supine at 90 abd)    AROM L SHLDR APR 26  AROM BUEs  Flex R tight end-range, L 155DEG  Abd R tight end-range, L 135DEG  IR R T10, L T12 IR L 80DEG: SAYS HER SHLDR IS PULLING VS PAIN.  ER R 102, L 71DEG (supine at 90abd)    L Shoulder Evaluation 01/06/2020  MOI: unknown    DOI: Upper arm with exercise the past 3-4 mo. March 1st 2021 sleeping on futon and helping daughter with newborn.     C/O: L upper arm pain    R hand dominant    Decrease: Ice, injection,    PLOF: pilates reformer    PMHx: chronic kidney disease    Diagnostics: xray 12/22/2019  1. No acute fracture or dislocation.  2.  Probable rotator cuff tendinopathy.     SPADI: 59/130    AROM BUEs  Flex R tight end-range, L 141  Abd R tight end-range, L 101  IR R T10, L T12  ER R 102, L 59 (supine at 90abd)    MMT BUEs  Flex R 4/5, L 2/5  Abd R 4/5, L 2/5  IR R 4/5, L 2/5  ER R 4/5, L 2/5  Post Delt 4/5  Mid Trap 3/5  Lower Trap R 3/5, L unable to assess    Special Tests:  Neers (+)  Hawkins-Kennedy (+)  Speeds (+) pain    Palpation: (+) UT, teres major/minor, pectorals                   Treatment     Therapeutic Exercises - Justified to address any of the following: develop strength, endurance, ROM and/or flexibility.   ube 5/5 lvl 3    Ut and lev scap stretching L 4 x     Neuromuscular Re-Education - Justified to  address any of the following: of movement, balance, coordination, kinesthetic sense, posture and/or proprioception for sitting and/or standing activities.   NOT DONE Body blade Lt 2x80min each:  At side  At 90 flex  At 90 Abd    Seated PNF sword RTB 3x10    Manual Therapy - Justified to address any of the following:  Mobilization of joints and soft tissues, manipulation, manual lymphatic drainage, and/or manual traction.    Supine:  Ut, lev scap DTM  CB, SO release  Inf and post GH mobs: grade III  Serratus releae  Inf mobs 1st rib L    S/L R: L scap release, teres major    Therapeutic Activity - Justified to address the following: activities to improve functional performance.  Seated lat pull downs Blue 3x10  Seated bilat rows bilat Rows Black 3x10    Prone:1# R and L  T and I    Seated: GTB ER 3ways and chest pulls 3ways    Home Exercises   HEP: table slides (flex, abd, ER), strap IR , scap squeezes, UT stretches     GTB:  Chest pulls  ER   Pull dwns    Modalities   Skin check completed    HP L shoulder in supine for pain x         Assessment   IE ASSESSMENT 01/06/2020: Pt is a 69 yo F experiencing L shoulder pain x 4 month after starting caring for her grand baby and sleeping at her daughter's home. She is no longer able to do her pilates routine due to pain.  At evaluation, she has limited and painful motion in all planes with scapular weakness. She will benefit from PT to address these deficits in order to care for her grandchild, perform house chores, and return to pilates.     DAILY ASSESSMENT: Pt  Reporting contd fatigue with progression of scap re-ed activities L shldr.    Plan   Assess ROM L: progress scap re ed activities.      Goals    Goal 1: Independent and consistent with her HEP.   Sessions: 8      Goal 2: Improve her AROM abd to 160 to be able to reach into overhead cabinets   Sessions: 8      Goal 3: Improve her AROM flex and abd to 170 to be able to perform overh   Sessions: 16      Goal 4:  Improve her SPADI score to 9/130 to be able to carry her newborn grandchild and wash with ease   Sessions: 16          Goal 5: Improve her pain level to 1/10 at worst with lifting her grandchild, washing her back, and reaching overhead   Sessions: 16      Goal 6: Improve her IR to T10 to wash her back and donn a bra with ease.    Sessions: 16                      Bud Face, PT

## 2020-01-28 NOTE — PT/OT Therapy Note (Signed)
Name: Victoria Fields Age: 69 y.o.   Date of Service: 01/26/2020  Referring Physician: Phillips Odor, MD   Date of Injury: 08/19/2019  Date Care Plan Established/Reviewed: 01/06/2020  Date Treatment Started: 01/06/2020  End of Certification Date: 03/05/2020  Sessions in Plan of Care: 16  Surgery Date: No data was found    Visit Count: 7   Diagnosis:   1. Acute pain of left shoulder        Subjective     History of Present Illness   History of Present Illness:  S: shldr is still painful reaching OH and to the side L side.  Would like revision of her exercises.    LEVEL:  1/10 L shoulder, 5/10 at worst    Social Support/Occupation      Occupation: retired      Precautions: No data was found  Allergies: Cephalosporins, Strawberry extract, Cefuroxime, Dust mite extract, Molds & smuts, Niacin, Pollen extract, and Tree extract    Objective   L Shoulder Evaluation 01/06/2020  MOI: unknown    DOI: Upper arm with exercise the past 3-4 mo. March 1st 2021 sleeping on futon and helping daughter with newborn.     C/O: L upper arm pain    R hand dominant    Decrease: Ice, injection,    PLOF: pilates reformer    PMHx: chronic kidney disease    Diagnostics: xray 12/22/2019  1. No acute fracture or dislocation.  2.  Probable rotator cuff tendinopathy.     SPADI: 59/130    AROM BUEs  Flex R tight end-range, L 141  Abd R tight end-range, L 101  IR R T10, L T12  ER R 102, L 59 (supine at 90abd)    MMT BUEs  Flex R 4/5, L 2/5  Abd R 4/5, L 2/5  IR R 4/5, L 2/5  ER R 4/5, L 2/5  Post Delt 4/5  Mid Trap 3/5  Lower Trap R 3/5, L unable to assess    Special Tests:  Neers (+)  Hawkins-Kennedy (+)  Speeds (+) pain    Palpation: (+) UT, teres major/minor, pectorals               Treatment     Therapeutic Exercises - Justified to address any of the following: develop strength, endurance, ROM and/or flexibility.   ube retro lvl 2 x .    5 x   Seated selfmobs FF    Seated self mobs: abd    IR strap     3 x :  L ut  stretching    Seated: x 30  scap retract and depression    Provided with HEP sheet.      Neuromuscular Re-Education - Justified to address any of the following: of movement, balance, coordination, kinesthetic sense, posture and/or proprioception for sitting and/or standing activities.   NOT DONE    Manual Therapy - Justified to address any of the following:  Mobilization of joints and soft tissues, manipulation, manual lymphatic drainage, and/or manual traction.    Supine:  Ut, lev scap DTM    CB, SO release    Inf and post GH mobs: grade III    Serratus releae    Inf mobs 1st rib L    Home Exercises   HEP: table slides (flex, abd, ER), strap IR , scap squeezes, UT stretches     Modalities   Skin check completed    Pain and DOMS reduction: hp x  L shldr: pt supine.       ---      ---   Total Time   Timed Minutes  53 minutes   Untimed Minutes  15 minutes   Total Time  68 minutes        Assessment   IE ASSESSMENT 01/06/2020: Pt is a 69 yo F experiencing L shoulder pain x 4 month after starting caring for her grand baby and sleeping at her daughter's home. She is no longer able to do her pilates routine due to pain. At evaluation, she has limited and painful motion in all planes with scapular weakness. She will benefit from PT to address these deficits in order to care for her grandchild, perform house chores, and return to pilates.     DAILY ASSESSMENT: pt with contd pain and hypomobility L shldr with increased ut, lev scap tone: needing repeated VC,TC to correct alignment with scap retract activities.  Plan   Cont PT: progress to pulleys: ROM measurements L.      Goals    Goal 1: Independent and consistent with her HEP.   Sessions: 8      Goal 2: Improve her AROM abd to 160 to be able to reach into overhead cabinets   Sessions: 8      Goal 3: Improve her AROM flex and abd to 170 to be able to perform overh   Sessions: 16      Goal 4: Improve her SPADI score to 9/130 to be able to carry her newborn grandchild  and wash with ease   Sessions: 16          Goal 5: Improve her pain level to 1/10 at worst with lifting her grandchild, washing her back, and reaching overhead   Sessions: 16      Goal 6: Improve her IR to T10 to wash her back and donn a bra with ease.    Sessions: 16                      Victoria Fields, PT        Name: Victoria Fields Age: 69 y.o.   Date of Service: 01/26/2020  Referring Physician: Phillips Odor, MD   Date of Injury: 08/19/2019  Date Care Plan Established/Reviewed: 01/06/2020  Date Treatment Started: 01/06/2020  End of Certification Date: 03/05/2020  Sessions in Plan of Care: 16  Surgery Date: No data was found    Visit Count: 7   Diagnosis:   1. Acute pain of left shoulder        Subjective     History of Present Illness   History of Present Illness:  S: Extremely sore since last session. Been trying ice and heat    LEVEL:  2/10 L shoulder, 5/10 at worst    Social Support/Occupation      Occupation: retired      Precautions: No data was found  Allergies: Cephalosporins, Strawberry extract, Cefuroxime, Dust mite extract, Molds & smuts, Niacin, Pollen extract, and Tree extract    Objective   AROM L SHLDR APR 26  AROM BUEs  Flex R tight end-range, L 155DEG  Abd R tight end-range, L 135DEG  IR R T10, L T12 IR L 80DEG: SAYS HER SHLDR IS PULLING VS PAIN.  ER R 102, L 71DEG (supine at 90abd)    L Shoulder Evaluation 01/06/2020  MOI: unknown    DOI: Upper arm with exercise the past 3-4 mo.  March 1st 2021 sleeping on futon and helping daughter with newborn.     C/O: L upper arm pain    R hand dominant    Decrease: Ice, injection,    PLOF: pilates reformer    PMHx: chronic kidney disease    Diagnostics: xray 12/22/2019  1. No acute fracture or dislocation.  2.  Probable rotator cuff tendinopathy.     SPADI: 59/130    AROM BUEs  Flex R tight end-range, L 141  Abd R tight end-range, L 101  IR R T10, L T12  ER R 102, L 59 (supine at 90abd)    MMT BUEs  Flex R 4/5, L 2/5  Abd R 4/5, L 2/5  IR R 4/5, L  2/5  ER R 4/5, L 2/5  Post Delt 4/5  Mid Trap 3/5  Lower Trap R 3/5, L unable to assess    Special Tests:  Neers (+)  Hawkins-Kennedy (+)  Speeds (+) pain    Palpation: (+) UT, teres major/minor, pectorals                   Treatment     Therapeutic Exercises - Justified to address any of the following: develop strength, endurance, ROM and/or flexibility.   UBE LVL 5  x5 retro    UT stretches 3x49min    Doorway/corner stretches 3x92min    Prone I's 1# 3x10  Prone T's 3x10     Manual Therapy - Justified to address any of the following:  Mobilization of joints and soft tissues, manipulation, manual lymphatic drainage, and/or manual traction.    Supine:  Ut, lev scap DTM  CB, SO release  Inf and post GH mobs: grade III  Serratus releae  Inf mobs 1st rib L    S/L R: L scap release, teres major    Therapeutic Activity - Justified to address the following: activities to improve functional performance.  Seated lat pull downs Blue 3x10  Seated bilat rows bilat Rows Black 3x10    Wall pushups 3x10        Home Exercises   HEP: table slides (flex, abd, ER), strap IR , scap squeezes, UT stretches        ---      ---   Total Time   Timed Minutes  53 minutes   Untimed Minutes  15 minutes   Total Time  68 minutes        Assessment   IE ASSESSMENT 01/06/2020: Pt is a 69 yo F experiencing L shoulder pain x 4 month after starting caring for her grand baby and sleeping at her daughter's home. She is no longer able to do her pilates routine due to pain. At evaluation, she has limited and painful motion in all planes with scapular weakness. She will benefit from PT to address these deficits in order to care for her grandchild, perform house chores, and return to pilates.     DAILY ASSESSMENT: Pt with experiencing increased soreness since last session. Able to tolerate exercises, but c/o pain. Tightness in UT due to muscle engagement with shoulder elevation.   Plan   Next visit: pulleys, spider and wall walks       Goals    Goal 1:  Independent and consistent with her HEP.   Sessions: 8      Goal 2: Improve her AROM abd to 160 to be able to reach into overhead cabinets   Sessions: 8      Goal  3: Improve her AROM flex and abd to 170 to be able to perform overh   Sessions: 16      Goal 4: Improve her SPADI score to 9/130 to be able to carry her newborn grandchild and wash with ease   Sessions: 16          Goal 5: Improve her pain level to 1/10 at worst with lifting her grandchild, washing her back, and reaching overhead   Sessions: 16      Goal 6: Improve her IR to T10 to wash her back and donn a bra with ease.    Sessions: 16                      Victoria Fields, PT        Name: Victoria Fields Age: 69 y.o.   Date of Service: 01/26/2020  Referring Physician: Phillips Odor, MD   Date of Injury: 08/19/2019  Date Care Plan Established/Reviewed: 01/06/2020  Date Treatment Started: 01/06/2020  End of Certification Date: 03/05/2020  Sessions in Plan of Care: 16  Surgery Date: No data was found    Visit Count: 7   Diagnosis:   1. Acute pain of left shoulder        Subjective     History of Present Illness   History of Present Illness:  S: shldr is still painful reaching OH and to the side L side.  Would like revision of her exercises.    LEVEL:  1/10 L shoulder, 5/10 at worst    Social Support/Occupation      Occupation: retired      Precautions: No data was found  Allergies: Cephalosporins, Strawberry extract, Cefuroxime, Dust mite extract, Molds & smuts, Niacin, Pollen extract, and Tree extract    Objective   L Shoulder Evaluation 01/06/2020  MOI: unknown    DOI: Upper arm with exercise the past 3-4 mo. March 1st 2021 sleeping on futon and helping daughter with newborn.     C/O: L upper arm pain    R hand dominant    Decrease: Ice, injection,    PLOF: pilates reformer    PMHx: chronic kidney disease    Diagnostics: xray 12/22/2019  1. No acute fracture or dislocation.  2.  Probable rotator cuff tendinopathy.     SPADI: 59/130    AROM BUEs  Flex R  tight end-range, L 141  Abd R tight end-range, L 101  IR R T10, L T12  ER R 102, L 59 (supine at 90abd)    MMT BUEs  Flex R 4/5, L 2/5  Abd R 4/5, L 2/5  IR R 4/5, L 2/5  ER R 4/5, L 2/5  Post Delt 4/5  Mid Trap 3/5  Lower Trap R 3/5, L unable to assess    Special Tests:  Neers (+)  Hawkins-Kennedy (+)  Speeds (+) pain    Palpation: (+) UT, teres major/minor, pectorals               Treatment     Therapeutic Exercises - Justified to address any of the following: develop strength, endurance, ROM and/or flexibility.   ube retro lvl 2 x .    5 x   Seated selfmobs FF    Seated self mobs: abd    IR strap     3 x :  L ut stretching    Seated: x 30  scap retract and depression    Provided  with HEP sheet.      Neuromuscular Re-Education - Justified to address any of the following: of movement, balance, coordination, kinesthetic sense, posture and/or proprioception for sitting and/or standing activities.   NOT DONE    Manual Therapy - Justified to address any of the following:  Mobilization of joints and soft tissues, manipulation, manual lymphatic drainage, and/or manual traction.    Supine:  Ut, lev scap DTM    CB, SO release    Inf and post GH mobs: grade III    Serratus releae    Inf mobs 1st rib L    Home Exercises   HEP: table slides (flex, abd, ER), strap IR , scap squeezes, UT stretches     Modalities   Skin check completed    Pain and DOMS reduction: hp x L shldr: pt supine.       ---      ---   Total Time   Timed Minutes  53 minutes   Untimed Minutes  15 minutes   Total Time  68 minutes        Assessment   IE ASSESSMENT 01/06/2020: Pt is a 69 yo F experiencing L shoulder pain x 4 month after starting caring for her grand baby and sleeping at her daughter's home. She is no longer able to do her pilates routine due to pain. At evaluation, she has limited and painful motion in all planes with scapular weakness. She will benefit from PT to address these deficits in order to care for her  grandchild, perform house chores, and return to pilates.     DAILY ASSESSMENT: pt with contd pain and hypomobility L shldr with increased ut, lev scap tone: needing repeated VC,TC to correct alignment with scap retract activities.  Plan   Cont PT: progress to pulleys: ROM measurements L.      Goals    Goal 1: Independent and consistent with her HEP.   Sessions: 8      Goal 2: Improve her AROM abd to 160 to be able to reach into overhead cabinets   Sessions: 8      Goal 3: Improve her AROM flex and abd to 170 to be able to perform overh   Sessions: 16      Goal 4: Improve her SPADI score to 9/130 to be able to carry her newborn grandchild and wash with ease   Sessions: 16          Goal 5: Improve her pain level to 1/10 at worst with lifting her grandchild, washing her back, and reaching overhead   Sessions: 16      Goal 6: Improve her IR to T10 to wash her back and donn a bra with ease.    Sessions: 16                      Victoria Fields, PT        Name: Victoria Fields Age: 69 y.o.   Date of Service: 01/26/2020  Referring Physician: Phillips Odor, MD   Date of Injury: 08/19/2019  Date Care Plan Established/Reviewed: 01/06/2020  Date Treatment Started: 01/06/2020  End of Certification Date: 03/05/2020  Sessions in Plan of Care: 16  Surgery Date: No data was found    Visit Count: 7   Diagnosis:   1. Acute pain of left shoulder        Subjective     History of Present Illness   History of  Present Illness:  S: Extremely sore since last session. Been trying ice and heat    LEVEL:  2/10 L shoulder, 5/10 at worst    Social Support/Occupation      Occupation: retired      Precautions: No data was found  Allergies: Cephalosporins, Strawberry extract, Cefuroxime, Dust mite extract, Molds & smuts, Niacin, Pollen extract, and Tree extract    Objective   AROM L SHLDR APR 26  AROM BUEs  Flex R tight end-range, L 155DEG  Abd R tight end-range, L 135DEG  IR R T10, L T12 IR L 80DEG: SAYS HER SHLDR IS PULLING VS PAIN.  ER R 102, L 71DEG  (supine at 90abd)    L Shoulder Evaluation 01/06/2020  MOI: unknown    DOI: Upper arm with exercise the past 3-4 mo. March 1st 2021 sleeping on futon and helping daughter with newborn.     C/O: L upper arm pain    R hand dominant    Decrease: Ice, injection,    PLOF: pilates reformer    PMHx: chronic kidney disease    Diagnostics: xray 12/22/2019  1. No acute fracture or dislocation.  2.  Probable rotator cuff tendinopathy.     SPADI: 59/130    AROM BUEs  Flex R tight end-range, L 141  Abd R tight end-range, L 101  IR R T10, L T12  ER R 102, L 59 (supine at 90abd)    MMT BUEs  Flex R 4/5, L 2/5  Abd R 4/5, L 2/5  IR R 4/5, L 2/5  ER R 4/5, L 2/5  Post Delt 4/5  Mid Trap 3/5  Lower Trap R 3/5, L unable to assess    Special Tests:  Neers (+)  Hawkins-Kennedy (+)  Speeds (+) pain    Palpation: (+) UT, teres major/minor, pectorals                   Treatment     Therapeutic Exercises - Justified to address any of the following: develop strength, endurance, ROM and/or flexibility.   UBE LVL 5  x5 retro    UT stretches 3x18min    Doorway/corner stretches 3x56min    Prone I's 1# 3x10  Prone T's 3x10     Manual Therapy - Justified to address any of the following:  Mobilization of joints and soft tissues, manipulation, manual lymphatic drainage, and/or manual traction.    Supine:  Ut, lev scap DTM  CB, SO release  Inf and post GH mobs: grade III  Serratus releae  Inf mobs 1st rib L    S/L R: L scap release, teres major    Therapeutic Activity - Justified to address the following: activities to improve functional performance.  Seated lat pull downs Blue 3x10  Seated bilat rows bilat Rows Black 3x10    Wall pushups 3x10        Home Exercises   HEP: table slides (flex, abd, ER), strap IR , scap squeezes, UT stretches        ---      ---   Total Time   Timed Minutes  53 minutes   Untimed Minutes  15 minutes   Total Time  68 minutes        Assessment   IE ASSESSMENT 01/06/2020: Pt is a 69 yo F experiencing L shoulder  pain x 4 month after starting caring for her grand baby and sleeping at her daughter's home. She is no longer able to  do her pilates routine due to pain. At evaluation, she has limited and painful motion in all planes with scapular weakness. She will benefit from PT to address these deficits in order to care for her grandchild, perform house chores, and return to pilates.     DAILY ASSESSMENT: Pt with experiencing increased soreness since last session. Able to tolerate exercises, but c/o pain. Tightness in UT due to muscle engagement with shoulder elevation.   Plan   Next visit: pulleys, spider and wall walks       Goals    Goal 1: Independent and consistent with her HEP.   Sessions: 8      Goal 2: Improve her AROM abd to 160 to be able to reach into overhead cabinets   Sessions: 8      Goal 3: Improve her AROM flex and abd to 170 to be able to perform overh   Sessions: 16      Goal 4: Improve her SPADI score to 9/130 to be able to carry her newborn grandchild and wash with ease   Sessions: 16          Goal 5: Improve her pain level to 1/10 at worst with lifting her grandchild, washing her back, and reaching overhead   Sessions: 16      Goal 6: Improve her IR to T10 to wash her back and donn a bra with ease.    Sessions: 16                      Victoria Fields, PT        Name: Victoria Fields Age: 69 y.o.   Date of Service: 01/26/2020  Referring Physician: Phillips Odor, MD   Date of Injury: 08/19/2019  Date Care Plan Established/Reviewed: 01/06/2020  Date Treatment Started: 01/06/2020  End of Certification Date: 03/05/2020  Sessions in Plan of Care: 16  Surgery Date: No data was found    Visit Count: 7   Diagnosis:   1. Acute pain of left shoulder        Subjective     History of Present Illness   History of Present Illness:  S: shldr is still painful reaching OH and to the side L side.  Would like revision of her exercises.    LEVEL:  1/10 L shoulder, 5/10 at worst    Social Support/Occupation      Occupation:  retired      Precautions: No data was found  Allergies: Cephalosporins, Strawberry extract, Cefuroxime, Dust mite extract, Molds & smuts, Niacin, Pollen extract, and Tree extract    Objective   L Shoulder Evaluation 01/06/2020  MOI: unknown    DOI: Upper arm with exercise the past 3-4 mo. March 1st 2021 sleeping on futon and helping daughter with newborn.     C/O: L upper arm pain    R hand dominant    Decrease: Ice, injection,    PLOF: pilates reformer    PMHx: chronic kidney disease    Diagnostics: xray 12/22/2019  1. No acute fracture or dislocation.  2.  Probable rotator cuff tendinopathy.     SPADI: 59/130    AROM BUEs  Flex R tight end-range, L 141  Abd R tight end-range, L 101  IR R T10, L T12  ER R 102, L 59 (supine at 90abd)    MMT BUEs  Flex R 4/5, L 2/5  Abd R 4/5, L 2/5  IR R 4/5, L  2/5  ER R 4/5, L 2/5  Post Delt 4/5  Mid Trap 3/5  Lower Trap R 3/5, L unable to assess    Special Tests:  Neers (+)  Hawkins-Kennedy (+)  Speeds (+) pain    Palpation: (+) UT, teres major/minor, pectorals               Treatment     Therapeutic Exercises - Justified to address any of the following: develop strength, endurance, ROM and/or flexibility.   ube retro lvl 2 x .    5 x   Seated selfmobs FF    Seated self mobs: abd    IR strap     3 x :  L ut stretching    Seated: x 30  scap retract and depression    Provided with HEP sheet.      Neuromuscular Re-Education - Justified to address any of the following: of movement, balance, coordination, kinesthetic sense, posture and/or proprioception for sitting and/or standing activities.   NOT DONE    Manual Therapy - Justified to address any of the following:  Mobilization of joints and soft tissues, manipulation, manual lymphatic drainage, and/or manual traction.    Supine:  Ut, lev scap DTM    CB, SO release    Inf and post GH mobs: grade III    Serratus releae    Inf mobs 1st rib L    Home Exercises   HEP: table slides (flex, abd, ER), strap IR ,  scap squeezes, UT stretches     Modalities   Skin check completed    Pain and DOMS reduction: hp x L shldr: pt supine.       ---      ---   Total Time   Timed Minutes  53 minutes   Untimed Minutes  15 minutes   Total Time  68 minutes        Assessment   IE ASSESSMENT 01/06/2020: Pt is a 69 yo F experiencing L shoulder pain x 4 month after starting caring for her grand baby and sleeping at her daughter's home. She is no longer able to do her pilates routine due to pain. At evaluation, she has limited and painful motion in all planes with scapular weakness. She will benefit from PT to address these deficits in order to care for her grandchild, perform house chores, and return to pilates.     DAILY ASSESSMENT: pt with contd pain and hypomobility L shldr with increased ut, lev scap tone: needing repeated VC,TC to correct alignment with scap retract activities.  Plan   Cont PT: progress to pulleys: ROM measurements L.      Goals    Goal 1: Independent and consistent with her HEP.   Sessions: 8      Goal 2: Improve her AROM abd to 160 to be able to reach into overhead cabinets   Sessions: 8      Goal 3: Improve her AROM flex and abd to 170 to be able to perform overh   Sessions: 16      Goal 4: Improve her SPADI score to 9/130 to be able to carry her newborn grandchild and wash with ease   Sessions: 16          Goal 5: Improve her pain level to 1/10 at worst with lifting her grandchild, washing her back, and reaching overhead   Sessions: 16      Goal 6: Improve her IR to T10  to wash her back and donn a bra with ease.    Sessions: 16                      Victoria Fields, PT        Name: Victoria Fields Age: 69 y.o.   Date of Service: 01/26/2020  Referring Physician: Phillips Odor, MD   Date of Injury: 08/19/2019  Date Care Plan Established/Reviewed: 01/06/2020  Date Treatment Started: 01/06/2020  End of Certification Date: 03/05/2020  Sessions in Plan of Care: 16  Surgery Date: No data was found    Visit Count: 7    Diagnosis:   1. Acute pain of left shoulder        Subjective     History of Present Illness   History of Present Illness:  S: she has been feeling better: less pain reaching OH, but still painful reaching behind her back and OH.    LEVEL:  2/10 L shoulder, 5/10 at worst    Social Support/Occupation      Occupation: retired      Precautions: No data was found  Allergies: Cephalosporins, Strawberry extract, Cefuroxime, Dust mite extract, Molds & smuts, Niacin, Pollen extract, and Tree extract    Objective   AROM L shoulder  Flex WFL bilat  Abd WNL bilat  IR L T11  ER L 74 (supine at 90 abd)    AROM L SHLDR APR 26  AROM BUEs  Flex R tight end-range, L 155DEG  Abd R tight end-range, L 135DEG  IR R T10, L T12 IR L 80DEG: SAYS HER SHLDR IS PULLING VS PAIN.  ER R 102, L 71DEG (supine at 90abd)    L Shoulder Evaluation 01/06/2020  MOI: unknown    DOI: Upper arm with exercise the past 3-4 mo. March 1st 2021 sleeping on futon and helping daughter with newborn.     C/O: L upper arm pain    R hand dominant    Decrease: Ice, injection,    PLOF: pilates reformer    PMHx: chronic kidney disease    Diagnostics: xray 12/22/2019  1. No acute fracture or dislocation.  2.  Probable rotator cuff tendinopathy.     SPADI: 59/130    AROM BUEs  Flex R tight end-range, L 141  Abd R tight end-range, L 101  IR R T10, L T12  ER R 102, L 59 (supine at 90abd)    MMT BUEs  Flex R 4/5, L 2/5  Abd R 4/5, L 2/5  IR R 4/5, L 2/5  ER R 4/5, L 2/5  Post Delt 4/5  Mid Trap 3/5  Lower Trap R 3/5, L unable to assess    Special Tests:  Neers (+)  Hawkins-Kennedy (+)  Speeds (+) pain    Palpation: (+) UT, teres major/minor, pectorals                   Treatment     Therapeutic Exercises - Justified to address any of the following: develop strength, endurance, ROM and/or flexibility.   NOT DONE    UBE LVL 5  x5 retro    UT stretches 3x38min    Seated IR strap stretches 3x30min    Doorway/corner stretches 3x88min-not done    Prone I's 1#  3x10  Prone T's 3x10     Neuromuscular Re-Education - Justified to address any of the following: of movement, balance, coordination, kinesthetic sense, posture and/or proprioception for sitting  and/or standing activities.   NOT DONE    ody blade Lt 2x57min each:  At side  At 90 flex  At 90 Abd    Seated PNF sword RTB 3x10    Manual Therapy - Justified to address any of the following:  Mobilization of joints and soft tissues, manipulation, manual lymphatic drainage, and/or manual traction.    Supine:  Ut, lev scap DTM  CB, SO release  Inf and post GH mobs: grade III  Serratus releae  Inf mobs 1st rib L    S/L R: L scap release, teres major    Therapeutic Activity - Justified to address the following: activities to improve functional performance.  UBE :  Retro focus on posture awareness: lvl 3 x back of rest    GTB: x 30  Wall walks    Seated in chair: blue TB:   Pull dwns    Ts    Rows and ER    IR strap 3 x         Home Exercises   HEP: table slides (flex, abd, ER), strap IR , scap squeezes, UT stretches         Modalities   Skin check completed    Pain reduction: es/if w hp x post shldr: R: pt supine.       ---      ---   Total Time   Timed Minutes  53 minutes   Untimed Minutes  15 minutes   Total Time  68 minutes        Assessment   IE ASSESSMENT 01/06/2020: Pt is a 69 yo F experiencing L shoulder pain x 4 month after starting caring for her grand baby and sleeping at her daughter's home. She is no longer able to do her pilates routine due to pain. At evaluation, she has limited and painful motion in all planes with scapular weakness. She will benefit from PT to address these deficits in order to care for her grandchild, perform house chores, and return to pilates.     DAILY ASSESSMENT: pt with contd pain reduction and improving IR and ER ROM L shldr; but still c/o slight tightness abd and FF.  Plan   Progress scap re-ed activities.      Goals    Goal 1: Independent and consistent with her HEP.    Sessions: 8      Goal 2: Improve her AROM abd to 160 to be able to reach into overhead cabinets   Sessions: 8      Goal 3: Improve her AROM flex and abd to 170 to be able to perform overh   Sessions: 16      Goal 4: Improve her SPADI score to 9/130 to be able to carry her newborn grandchild and wash with ease   Sessions: 16          Goal 5: Improve her pain level to 1/10 at worst with lifting her grandchild, washing her back, and reaching overhead   Sessions: 16      Goal 6: Improve her IR to T10 to wash her back and donn a bra with ease.    Sessions: 16                      Victoria Fields, PT

## 2020-02-04 ENCOUNTER — Ambulatory Visit (INDEPENDENT_AMBULATORY_CARE_PROVIDER_SITE_OTHER): Payer: BLUE CROSS/BLUE SHIELD | Admitting: Family Medicine

## 2020-02-04 ENCOUNTER — Ambulatory Visit (INDEPENDENT_AMBULATORY_CARE_PROVIDER_SITE_OTHER): Payer: BLUE CROSS/BLUE SHIELD | Admitting: Physical Therapist

## 2020-02-04 ENCOUNTER — Encounter (INDEPENDENT_AMBULATORY_CARE_PROVIDER_SITE_OTHER): Payer: Self-pay | Admitting: Family Medicine

## 2020-02-04 DIAGNOSIS — M25512 Pain in left shoulder: Secondary | ICD-10-CM

## 2020-02-04 DIAGNOSIS — M7552 Bursitis of left shoulder: Secondary | ICD-10-CM

## 2020-02-04 NOTE — Progress Notes (Signed)
Subjective:      Patient ID: Victoria Fields is a 69 y.o. female     Chief Complaint   Patient presents with    Shoulder Pain        HPI   L shoulder pain dropped by half with bursal injection alone  And PT has helped with pain to improve it completely. Does exercises at home. Notes she is able to do normal activities of daily living. If she stretches too far back that is the only time she feels it.     The following sections were reviewed this encounter by the provider:   Tobacco   Allergies   Meds   Problems   Med Hx   Surg Hx   Fam Hx          Review of Systems   Constitutional: Negative for chills and fever.   Respiratory: Negative for shortness of breath.    Cardiovascular: Negative for chest pain.   Musculoskeletal: Negative for arthralgias.          There were no vitals taken for this visit.    Objective:     Physical Exam  Vitals reviewed.   Constitutional:       General: She is not in acute distress.     Appearance: Normal appearance. She is not ill-appearing.   HENT:      Head: Normocephalic and atraumatic.      Right Ear: External ear normal.      Left Ear: External ear normal.   Eyes:      Extraocular Movements: Extraocular movements intact.   Pulmonary:      Comments: No cough, wheeze, or shortness of breath noted during exam.  Patient able to complete full sentences.  Musculoskeletal:      Comments: Left shoulder still has some mild tenderness with Hawkins maneuver, reduced range of motion especially with sleeper stretch.   Skin:     General: Skin is warm.      Coloration: Skin is not jaundiced or pale.   Neurological:      General: No focal deficit present.      Mental Status: She is alert. Mental status is at baseline.   Psychiatric:         Mood and Affect: Mood normal.         Thought Content: Thought content normal.          Assessment:     1. Acute bursitis of left shoulder        Plan:   Patient presents for follow-up of impingement syndrome of the left shoulder.  Subacromial injection and  physical therapy have resolved pain almost 100%.  Still has some mild pain with far reaching backward but otherwise no pain.  We will have patient finish out with physical therapy and follow-up for any worsening or concerning symptoms.  Told patient to continue to see implement physical therapy exercises throughout her day-to-day routine and to work on sleeper stretch at night.     [name], MD     *This note was generated by the [employer] EMR system/ Dragon speech recognition and may contain inherent errors or omissions not intended by the user. Grammatical errors, random word insertions, deletions, pronoun errors and incomplete sentences are occasional consequences of this technology due to software limitations. Not all errors are caught or corrected. If there are questions or concerns about the content of this note or information contained within the body of this dictation they should  be addressed directly with the author for clarification.

## 2020-02-04 NOTE — Progress Notes (Signed)
Shoulder Pain  Much improved with PT  Reports no pain, but still some decreased range of motion but patient states it does not impact her daily life

## 2020-02-04 NOTE — PT/OT Therapy Note (Signed)
Name: Victoria Fields Age: 69 y.o.   Date of Service: 02/04/2020  Referring Physician:     Date of Injury: 08/19/2019  Date Care Plan Established/Reviewed: 01/06/2020  Date Treatment Started: 01/06/2020  End of Certification Date: 03/05/2020  Sessions in Plan of Care: 16  Surgery Date: No data was found    Visit Count: 9   Diagnosis:   1. Acute pain of left shoulder        Subjective     History of Present Illness   History of Present Illness:  S: shldr is still painful reaching OH and to the side L side.  Would like revision of her exercises.    LEVEL:  1/10 L shoulder, 5/10 at worst    Social Support/Occupation      Occupation: retired      Precautions: No data was found  Allergies: Cephalosporins, Strawberry extract, Cefuroxime, Dust mite extract, Molds & smuts, Niacin, Pollen extract, and Tree extract    Objective   L Shoulder Evaluation 01/06/2020  MOI: unknown    DOI: Upper arm with exercise the past 3-4 mo. March 1st 2021 sleeping on futon and helping daughter with newborn.     C/O: L upper arm pain    R hand dominant    Decrease: Ice, injection,    PLOF: pilates reformer    PMHx: chronic kidney disease    Diagnostics: xray 12/22/2019  1. No acute fracture or dislocation.  2.  Probable rotator cuff tendinopathy.     SPADI: 59/130    AROM BUEs  Flex R tight end-range, L 141  Abd R tight end-range, L 101  IR R T10, L T12  ER R 102, L 59 (supine at 90abd)    MMT BUEs  Flex R 4/5, L 2/5  Abd R 4/5, L 2/5  IR R 4/5, L 2/5  ER R 4/5, L 2/5  Post Delt 4/5  Mid Trap 3/5  Lower Trap R 3/5, L unable to assess    Special Tests:  Neers (+)  Hawkins-Kennedy (+)  Speeds (+) pain    Palpation: (+) UT, teres major/minor, pectorals               Treatment     Therapeutic Exercises - Justified to address any of the following: develop strength, endurance, ROM and/or flexibility.   ube retro lvl 2 x .    5 x   Seated selfmobs FF    Seated self mobs: abd    IR strap     3 x :  L ut stretching    Seated: x  30  scap retract and depression    Provided with HEP sheet.      Neuromuscular Re-Education - Justified to address any of the following: of movement, balance, coordination, kinesthetic sense, posture and/or proprioception for sitting and/or standing activities.   NOT DONE    Manual Therapy - Justified to address any of the following:  Mobilization of joints and soft tissues, manipulation, manual lymphatic drainage, and/or manual traction.    Supine:  Ut, lev scap DTM    CB, SO release    Inf and post GH mobs: grade III    Serratus releae    Inf mobs 1st rib L    Home Exercises   HEP: table slides (flex, abd, ER), strap IR , scap squeezes, UT stretches     Modalities   Skin check completed    Pain and DOMS reduction: hp x L  shldr: pt supine.         Assessment   IE ASSESSMENT 01/06/2020: Pt is a 70 yo F experiencing L shoulder pain x 4 month after starting caring for her grand baby and sleeping at her daughter's home. She is no longer able to do her pilates routine due to pain. At evaluation, she has limited and painful motion in all planes with scapular weakness. She will benefit from PT to address these deficits in order to care for her grandchild, perform house chores, and return to pilates.     DAILY ASSESSMENT: pt with contd pain and hypomobility L shldr with increased ut, lev scap tone: needing repeated VC,TC to correct alignment with scap retract activities.  Plan   Cont PT: progress to pulleys: ROM measurements L.      Goals    Goal 1: Independent and consistent with her HEP.   Sessions: 8      Goal 2: Improve her AROM abd to 160 to be able to reach into overhead cabinets   Sessions: 8      Goal 3: Improve her AROM flex and abd to 170 to be able to perform overh   Sessions: 16      Goal 4: Improve her SPADI score to 9/130 to be able to carry her newborn grandchild and wash with ease   Sessions: 16          Goal 5: Improve her pain level to 1/10 at worst with lifting her grandchild, washing her back,  and reaching overhead   Sessions: 16      Goal 6: Improve her IR to T10 to wash her back and donn a bra with ease.    Sessions: 16                      Bud Face, PT        Name: Victoria Fields Age: 69 y.o.   Date of Service: 02/04/2020  Referring Physician:     Date of Injury: 08/19/2019  Date Care Plan Established/Reviewed: 01/06/2020  Date Treatment Started: 01/06/2020  End of Certification Date: 03/05/2020  Sessions in Plan of Care: 16  Surgery Date: No data was found    Visit Count: 9   Diagnosis:   1. Acute pain of left shoulder        Subjective     History of Present Illness   History of Present Illness:  S: Extremely sore since last session. Been trying ice and heat    LEVEL:  2/10 L shoulder, 5/10 at worst    Social Support/Occupation      Occupation: retired      Precautions: No data was found  Allergies: Cephalosporins, Strawberry extract, Cefuroxime, Dust mite extract, Molds & smuts, Niacin, Pollen extract, and Tree extract    Objective   AROM L SHLDR APR 26  AROM BUEs  Flex R tight end-range, L 155DEG  Abd R tight end-range, L 135DEG  IR R T10, L T12 IR L 80DEG: SAYS HER SHLDR IS PULLING VS PAIN.  ER R 102, L 71DEG (supine at 90abd)    L Shoulder Evaluation 01/06/2020  MOI: unknown    DOI: Upper arm with exercise the past 3-4 mo. March 1st 2021 sleeping on futon and helping daughter with newborn.     C/O: L upper arm pain    R hand dominant    Decrease: Ice, injection,    PLOF: [place]  PMHx: chronic kidney disease    Diagnostics: xray 12/22/2019  1. No acute fracture or dislocation.  2.  Probable rotator cuff tendinopathy.     SPADI: 59/130    AROM BUEs  Flex R tight end-range, L 141  Abd R tight end-range, L 101  IR R T10, L T12  ER R 102, L 59 (supine at 90abd)    MMT BUEs  Flex R 4/5, L 2/5  Abd R 4/5, L 2/5  IR R 4/5, L 2/5  ER R 4/5, L 2/5  Post Delt 4/5  Mid Trap 3/5  Lower Trap R 3/5, L unable to assess    Special Tests:  Neers (+)  Hawkins-Kennedy (+)  Speeds (+)  pain    Palpation: (+) UT, teres major/minor, pectorals                   Treatment     Therapeutic Exercises - Justified to address any of the following: develop strength, endurance, ROM and/or flexibility.   UBE LVL 5  x5 retro    UT stretches 3x40min    Doorway/corner stretches 3x60min    Prone I's 1# 3x10  Prone T's 3x10     Manual Therapy - Justified to address any of the following:  Mobilization of joints and soft tissues, manipulation, manual lymphatic drainage, and/or manual traction.    Supine:  Ut, lev scap DTM  CB, SO release  Inf and post GH mobs: grade III  Serratus releae  Inf mobs 1st rib L    S/L R: L scap release, teres major    Therapeutic Activity - Justified to address the following: activities to improve functional performance.  Seated lat pull downs Blue 3x10  Seated bilat rows bilat Rows Black 3x10    Wall pushups 3x10        Home Exercises   HEP: table slides (flex, abd, ER), strap IR , scap squeezes, UT stretches          Assessment   IE ASSESSMENT 01/06/2020: Pt is a 69 yo F experiencing L shoulder pain x 4 month after starting caring for her grand baby and sleeping at her daughter's home. She is no longer able to do her pilates routine due to pain. At evaluation, she has limited and painful motion in all planes with scapular weakness. She will benefit from PT to address these deficits in order to care for her grandchild, perform house chores, and return to pilates.     DAILY ASSESSMENT: Pt with experiencing increased soreness since last session. Able to tolerate exercises, but c/o pain. Tightness in UT due to muscle engagement with shoulder elevation.   Plan   Next visit: pulleys, spider and wall walks       Goals    Goal 1: Independent and consistent with her HEP.   Sessions: 8      Goal 2: Improve her AROM abd to 160 to be able to reach into overhead cabinets   Sessions: 8      Goal 3: Improve her AROM flex and abd to 170 to be able to perform overh   Sessions: 16      Goal 4: Improve  her SPADI score to 9/130 to be able to carry her newborn grandchild and wash with ease   Sessions: 16          Goal 5: Improve her pain level to 1/10 at worst with lifting her grandchild, washing her back, and reaching overhead  Sessions: 16      Goal 6: Improve her IR to T10 to wash her back and donn a bra with ease.    Sessions: 16                      Bud Face, PT        Name: Victoria Fields Age: 69 y.o.   Date of Service: 02/04/2020  Referring Physician:     Date of Injury: 08/19/2019  Date Care Plan Established/Reviewed: 01/06/2020  Date Treatment Started: 01/06/2020  End of Certification Date: 03/05/2020  Sessions in Plan of Care: 16  Surgery Date: No data was found    Visit Count: 9   Diagnosis:   1. Acute pain of left shoulder        Subjective     History of Present Illness   History of Present Illness:  S: shldr is still painful reaching OH and to the side L side.  Would like revision of her exercises.    LEVEL:  1/10 L shoulder, 5/10 at worst    Social Support/Occupation      Occupation: retired      Precautions: No data was found  Allergies: Cephalosporins, Strawberry extract, Cefuroxime, Dust mite extract, Molds & smuts, Niacin, Pollen extract, and Tree extract    Objective   L Shoulder Evaluation 01/06/2020  MOI: unknown    DOI: Upper arm with exercise the past 3-4 mo. March 1st 2021 sleeping on futon and helping daughter with newborn.     C/O: L upper arm pain    R hand dominant    Decrease: Ice, injection,    PLOF: pilates reformer    PMHx: chronic kidney disease    Diagnostics: xray 12/22/2019  1. No acute fracture or dislocation.  2.  Probable rotator cuff tendinopathy.     SPADI: 59/130    AROM BUEs  Flex R tight end-range, L 141  Abd R tight end-range, L 101  IR R T10, L T12  ER R 102, L 59 (supine at 90abd)    MMT BUEs  Flex R 4/5, L 2/5  Abd R 4/5, L 2/5  IR R 4/5, L 2/5  ER R 4/5, L 2/5  Post Delt 4/5  Mid Trap 3/5  Lower Trap R 3/5, L unable to assess    Special Tests:  Neers  (+)  Hawkins-Kennedy (+)  Speeds (+) pain    Palpation: (+) UT, teres major/minor, pectorals               Treatment     Therapeutic Exercises - Justified to address any of the following: develop strength, endurance, ROM and/or flexibility.   ube retro lvl 2 x .    5 x   Seated selfmobs FF    Seated self mobs: abd    IR strap     3 x :  L ut stretching    Seated: x 30  scap retract and depression    Provided with HEP sheet.      Neuromuscular Re-Education - Justified to address any of the following: of movement, balance, coordination, kinesthetic sense, posture and/or proprioception for sitting and/or standing activities.   NOT DONE    Manual Therapy - Justified to address any of the following:  Mobilization of joints and soft tissues, manipulation, manual lymphatic drainage, and/or manual traction.    Supine:  Ut, lev scap DTM    CB, SO release  Inf and post GH mobs: grade III    Serratus releae    Inf mobs 1st rib L    Home Exercises   HEP: table slides (flex, abd, ER), strap IR , scap squeezes, UT stretches     Modalities   Skin check completed    Pain and DOMS reduction: hp x L shldr: pt supine.         Assessment   IE ASSESSMENT 01/06/2020: Pt is a 69 yo F experiencing L shoulder pain x 4 month after starting caring for her grand baby and sleeping at her daughter's home. She is no longer able to do her pilates routine due to pain. At evaluation, she has limited and painful motion in all planes with scapular weakness. She will benefit from PT to address these deficits in order to care for her grandchild, perform house chores, and return to pilates.     DAILY ASSESSMENT: pt with contd pain and hypomobility L shldr with increased ut, lev scap tone: needing repeated VC,TC to correct alignment with scap retract activities.  Plan   Cont PT: progress to pulleys: ROM measurements L.      Goals    Goal 1: Independent and consistent with her HEP.   Sessions: 8      Goal 2: Improve her AROM abd  to 160 to be able to reach into overhead cabinets   Sessions: 8      Goal 3: Improve her AROM flex and abd to 170 to be able to perform overh   Sessions: 16      Goal 4: Improve her SPADI score to 9/130 to be able to carry her newborn grandchild and wash with ease   Sessions: 16          Goal 5: Improve her pain level to 1/10 at worst with lifting her grandchild, washing her back, and reaching overhead   Sessions: 16      Goal 6: Improve her IR to T10 to wash her back and donn a bra with ease.    Sessions: 16                      Bud Face, PT        Name: Victoria Fields Age: 69 y.o.   Date of Service: 02/04/2020  Referring Physician:     Date of Injury: 08/19/2019  Date Care Plan Established/Reviewed: 01/06/2020  Date Treatment Started: 01/06/2020  End of Certification Date: 03/05/2020  Sessions in Plan of Care: 16  Surgery Date: No data was found    Visit Count: 9   Diagnosis:   1. Acute pain of left shoulder        Subjective     History of Present Illness   History of Present Illness:  S: Extremely sore since last session. Been trying ice and heat    LEVEL:  2/10 L shoulder, 5/10 at worst    Social Support/Occupation      Occupation: retired      Precautions: No data was found  Allergies: Cephalosporins, Strawberry extract, Cefuroxime, Dust mite extract, Molds & smuts, Niacin, Pollen extract, and Tree extract    Objective   AROM L SHLDR APR 26  AROM BUEs  Flex R tight end-range, L 155DEG  Abd R tight end-range, L 135DEG  IR R T10, L T12 IR L 80DEG: SAYS HER SHLDR IS PULLING VS PAIN.  ER R 102, L 71DEG (supine at 90abd)  L Shoulder Evaluation 01/06/2020  MOI: unknown    DOI: Upper arm with exercise the past 3-4 mo. March 1st 2021 sleeping on futon and helping daughter with newborn.     C/O: L upper arm pain    R hand dominant    Decrease: Ice, injection,    PLOF: pilates reformer    PMHx: chronic kidney disease    Diagnostics: xray 12/22/2019  1. No acute fracture or dislocation.  2.  Probable  rotator cuff tendinopathy.     SPADI: 59/130    AROM BUEs  Flex R tight end-range, L 141  Abd R tight end-range, L 101  IR R T10, L T12  ER R 102, L 59 (supine at 90abd)    MMT BUEs  Flex R 4/5, L 2/5  Abd R 4/5, L 2/5  IR R 4/5, L 2/5  ER R 4/5, L 2/5  Post Delt 4/5  Mid Trap 3/5  Lower Trap R 3/5, L unable to assess    Special Tests:  Neers (+)  Hawkins-Kennedy (+)  Speeds (+) pain    Palpation: (+) UT, teres major/minor, pectorals                   Treatment     Therapeutic Exercises - Justified to address any of the following: develop strength, endurance, ROM and/or flexibility.   UBE LVL 5  x5 retro    UT stretches 3x58min    Doorway/corner stretches 3x36min    Prone I's 1# 3x10  Prone T's 3x10     Manual Therapy - Justified to address any of the following:  Mobilization of joints and soft tissues, manipulation, manual lymphatic drainage, and/or manual traction.    Supine:  Ut, lev scap DTM  CB, SO release  Inf and post GH mobs: grade III  Serratus releae  Inf mobs 1st rib L    S/L R: L scap release, teres major    Therapeutic Activity - Justified to address the following: activities to improve functional performance.  Seated lat pull downs Blue 3x10  Seated bilat rows bilat Rows Black 3x10    Wall pushups 3x10        Home Exercises   HEP: table slides (flex, abd, ER), strap IR , scap squeezes, UT stretches          Assessment   IE ASSESSMENT 01/06/2020: Pt is a 69 yo F experiencing L shoulder pain x 4 month after starting caring for her grand baby and sleeping at her daughter's home. She is no longer able to do her pilates routine due to pain. At evaluation, she has limited and painful motion in all planes with scapular weakness. She will benefit from PT to address these deficits in order to care for her grandchild, perform house chores, and return to pilates.     DAILY ASSESSMENT: Pt with experiencing increased soreness since last session. Able to tolerate exercises, but c/o pain. Tightness in UT due to  muscle engagement with shoulder elevation.   Plan   Next visit: pulleys, spider and wall walks       Goals    Goal 1: Independent and consistent with her HEP.   Sessions: 8      Goal 2: Improve her AROM abd to 160 to be able to reach into overhead cabinets   Sessions: 8      Goal 3: Improve her AROM flex and abd to 170 to be able to perform overh   Sessions:  16      Goal 4: Improve her SPADI score to 9/130 to be able to carry her newborn grandchild and wash with ease   Sessions: 16          Goal 5: Improve her pain level to 1/10 at worst with lifting her grandchild, washing her back, and reaching overhead   Sessions: 16      Goal 6: Improve her IR to T10 to wash her back and donn a bra with ease.    Sessions: 16                      Bud Face, PT        Name: Victoria Fields Age: 69 y.o.   Date of Service: 02/04/2020  Referring Physician:     Date of Injury: 08/19/2019  Date Care Plan Established/Reviewed: 01/06/2020  Date Treatment Started: 01/06/2020  End of Certification Date: 03/05/2020  Sessions in Plan of Care: 16  Surgery Date: No data was found    Visit Count: 9   Diagnosis:   1. Acute pain of left shoulder        Subjective     History of Present Illness   History of Present Illness:  S: shldr is still painful reaching OH and to the side L side.  Would like revision of her exercises.    LEVEL:  1/10 L shoulder, 5/10 at worst    Social Support/Occupation      Occupation: retired      Precautions: No data was found  Allergies: Cephalosporins, Strawberry extract, Cefuroxime, Dust mite extract, Molds & smuts, Niacin, Pollen extract, and Tree extract    Objective   L Shoulder Evaluation 01/06/2020  MOI: unknown    DOI: Upper arm with exercise the past 3-4 mo. March 1st 2021 sleeping on futon and helping daughter with newborn.     C/O: L upper arm pain    R hand dominant    Decrease: Ice, injection,    PLOF: pilates reformer    PMHx: chronic kidney disease    Diagnostics: xray 12/22/2019  1. No acute  fracture or dislocation.  2.  Probable rotator cuff tendinopathy.     SPADI: 59/130    AROM BUEs  Flex R tight end-range, L 141  Abd R tight end-range, L 101  IR R T10, L T12  ER R 102, L 59 (supine at 90abd)    MMT BUEs  Flex R 4/5, L 2/5  Abd R 4/5, L 2/5  IR R 4/5, L 2/5  ER R 4/5, L 2/5  Post Delt 4/5  Mid Trap 3/5  Lower Trap R 3/5, L unable to assess    Special Tests:  Neers (+)  Hawkins-Kennedy (+)  Speeds (+) pain    Palpation: (+) UT, teres major/minor, pectorals               Treatment     Therapeutic Exercises - Justified to address any of the following: develop strength, endurance, ROM and/or flexibility.   ube retro lvl 2 x .    5 x   Seated selfmobs FF    Seated self mobs: abd    IR strap     3 x :  L ut stretching    Seated: x 30  scap retract and depression    Provided with HEP sheet.      Neuromuscular Re-Education - Justified to address any of the following: of movement,  balance, coordination, kinesthetic sense, posture and/or proprioception for sitting and/or standing activities.   NOT DONE    Manual Therapy - Justified to address any of the following:  Mobilization of joints and soft tissues, manipulation, manual lymphatic drainage, and/or manual traction.    Supine:  Ut, lev scap DTM    CB, SO release    Inf and post GH mobs: grade III    Serratus releae    Inf mobs 1st rib L    Home Exercises   HEP: table slides (flex, abd, ER), strap IR , scap squeezes, UT stretches     Modalities   Skin check completed    Pain and DOMS reduction: hp x L shldr: pt supine.         Assessment   IE ASSESSMENT 01/06/2020: Pt is a 69 yo F experiencing L shoulder pain x 4 month after starting caring for her grand baby and sleeping at her daughter's home. She is no longer able to do her pilates routine due to pain. At evaluation, she has limited and painful motion in all planes with scapular weakness. She will benefit from PT to address these deficits in order to care for her grandchild,  perform house chores, and return to pilates.     DAILY ASSESSMENT: pt with contd pain and hypomobility L shldr with increased ut, lev scap tone: needing repeated VC,TC to correct alignment with scap retract activities.  Plan   Cont PT: progress to pulleys: ROM measurements L.      Goals    Goal 1: Independent and consistent with her HEP.   Sessions: 8      Goal 2: Improve her AROM abd to 160 to be able to reach into overhead cabinets   Sessions: 8      Goal 3: Improve her AROM flex and abd to 170 to be able to perform overh   Sessions: 16      Goal 4: Improve her SPADI score to 9/130 to be able to carry her newborn grandchild and wash with ease   Sessions: 16          Goal 5: Improve her pain level to 1/10 at worst with lifting her grandchild, washing her back, and reaching overhead   Sessions: 16      Goal 6: Improve her IR to T10 to wash her back and donn a bra with ease.    Sessions: 16                      Bud Face, PT        Name: Victoria Fields Age: 69 y.o.   Date of Service: 02/04/2020  Referring Physician:     Date of Injury: 08/19/2019  Date Care Plan Established/Reviewed: 01/06/2020  Date Treatment Started: 01/06/2020  End of Certification Date: 03/05/2020  Sessions in Plan of Care: 16  Surgery Date: No data was found    Visit Count: 9   Diagnosis:   1. Acute pain of left shoulder        Subjective     History of Present Illness   History of Present Illness:  S: she is back forn [place]; was trying to do all her exercises as instructed: says she has slight tightness L side of the neck and may be due to driving.  L shldr pain is still sog less: pain lvl max 2/10 L.    LEVEL:  2/10 L shoulder, 5/10 at worst  Social Support/Occupation      Occupation: retired      Precautions: No data was found  Allergies: Cephalosporins, Strawberry extract, Cefuroxime, Dust mite extract, Molds & smuts, Niacin, Pollen extract, and Tree extract    Objective     AROM L SHLDR MAY 19    AROM BUEs  Flex R tight end-range, L  160DEG  Abd R tight end-range, L 170DEG  IR R T10, L T11 IR L 80DEG: SAYS HER SHLDR IS PULLING VS PAIN.  ER R 102, L 65EG(supine at 90abd)      AROM L SHLDR APR 26  AROM BUEs  Flex R tight end-range, L 155DEG  Abd R tight end-range, L 135DEG  IR R T10, L T12 IR L 80DEG: SAYS HER SHLDR IS PULLING VS PAIN.  ER R 102, L 71DEG (supine at 90abd)    L Shoulder Evaluation 01/06/2020  MOI: unknown    DOI: Upper arm with exercise the past 3-4 mo. March 1st 2021 sleeping on futon and helping daughter with newborn.     C/O: L upper arm pain    R hand dominant    Decrease: Ice, injection,    PLOF: pilates reformer    PMHx: chronic kidney disease    Diagnostics: xray 12/22/2019  1. No acute fracture or dislocation.  2.  Probable rotator cuff tendinopathy.     SPADI: 59/130    AROM BUEs  Flex R tight end-range, L 141  Abd R tight end-range, L 101  IR R T10, L T12  ER R 102, L 59 (supine at 90abd)    MMT BUEs  Flex R 4/5, L 2/5  Abd R 4/5, L 2/5  IR R 4/5, L 2/5  ER R 4/5, L 2/5  Post Delt 4/5  Mid Trap 3/5  Lower Trap R 3/5, L unable to assess    Special Tests:  Neers (+)  Hawkins-Kennedy (+)  Speeds (+) pain    Palpation: (+) UT, teres major/minor, pectorals                   Treatment     Therapeutic Exercises - Justified to address any of the following: develop strength, endurance, ROM and/or flexibility.   ube 5/5 lvl 3    5 x :  Ut, lev scap stretching     3 x :  Wand ER     Neuromuscular Re-Education - Justified to address any of the following: of movement, balance, coordination, kinesthetic sense, posture and/or proprioception for sitting and/or standing activities.   Standing foam roll: each T, y and angels    GTB: chest pulls x 30    Manual Therapy - Justified to address any of the following:  Mobilization of joints and soft tissues, manipulation, manual lymphatic drainage, and/or manual traction.    Supine:  Ut, lev scap DTM  CB, SO release  Inf and post GH mobs: grade III  Serratus  releae  Inf mobs 1st rib L        Therapeutic Activity - Justified to address the following: activities to improve functional performance.  ROH, RBB     Flex 2# x 30    Scaption 3# x 30    Home Exercises   HEP: table slides (flex, abd, ER), strap IR , scap squeezes, UT stretches       Wand ER       Modalities   Skin check completed    Pain reduction: es/if w hp  x post shldr: R: pt supine.         Assessment   IE ASSESSMENT 01/06/2020: Pt is a 69 yo F experiencing L shoulder pain x 4 month after starting caring for her grand baby and sleeping at her daughter's home. She is no longer able to do her pilates routine due to pain. At evaluation, she has limited and painful motion in all planes with scapular weakness. She will benefit from PT to address these deficits in order to care for her grandchild, perform house chores, and return to pilates.     DAILY ASSESSMENT: pt with noted increased ER and IR ROM that is free of pain after mobs and activities completed.  Plan   Re-eval L shldr: she can cotn PT once a wk x more month.      Goals    Goal 1: Independent and consistent with her HEP.   Sessions: 8      Goal 2: Improve her AROM abd to 160 to be able to reach into overhead cabinets   Sessions: 8      Goal 3: Improve her AROM flex and abd to 170 to be able to perform overh   Sessions: 16      Goal 4: Improve her SPADI score to 9/130 to be able to carry her newborn grandchild and wash with ease   Sessions: 16          Goal 5: Improve her pain level to 1/10 at worst with lifting her grandchild, washing her back, and reaching overhead   Sessions: 16      Goal 6: Improve her IR to T10 to wash her back and donn a bra with ease.    Sessions: 16                      Bud Face, PT

## 2020-02-06 ENCOUNTER — Ambulatory Visit (INDEPENDENT_AMBULATORY_CARE_PROVIDER_SITE_OTHER): Payer: BLUE CROSS/BLUE SHIELD | Admitting: Physical Therapist

## 2020-02-06 DIAGNOSIS — M25512 Pain in left shoulder: Secondary | ICD-10-CM

## 2020-02-11 NOTE — Progress Notes (Signed)
Name:Victoria Fields Age: 69 y.o.   Date of Service: 02/06/2020  Referring Physician: Phillips Odor, MD   Date of Injury: 08/19/2019  Date Care Plan Established/Reviewed: 02/06/2020  Date Treatment Started: 01/06/2020  End of Certification Date: 03/05/2020  Sessions in Plan of Care: 16  Surgery Date: No data was found      Visit Count: 10   Diagnosis:   1. Acute pain of left shoulder        Subjective     History of Present Illness   History of Present Illness: S:says she is feeling > 70% better: almost painfree reaching OH,more stiffness than anything and is still doing all her exercises.  Has some pain when reaching back and behind her back.  Pain lvl max 2/10.      Social Support/Occupation      Occupation: retired      Precautions: No data was found  Allergies: Cephalosporins, Strawberry extract, Cefuroxime, Dust mite extract, Molds & smuts, Niacin, Pollen extract, and Tree extract    Past Medical History:   Diagnosis Date    Allergies     Anemia     exercise induced    Hearing disorder     Hyperlipidemia     Hypertension     Kidney stones     Pap smear for cervical cancer screening 03/05/2017    Thyroid disease        Objective   RE-EVAL L SHLDR Feb 06 2020    SPADI: 15/130    AROM BUEs  Flex R tight end-range, L 165  Abd R tight end-range, L 168  IR R T10, L T11  ER R 102, L 90 (supine at 90abd)    MMT BUEs  Flex R 4/5, L 4/5  Abd R 4/5, L 4/5  IR R 4/5, L 4/5  ER R 4/5, L 4/5  Post Delt 4/5  Mid Trap 4/5  Lower Trap R 3/5, L 3/5    L Shoulder Evaluation 01/06/2020  MOI: unknown    DOI: Upper arm with exercise the past 3-4 mo. March 1st 2021 sleeping on futon and helping daughter with newborn.     C/O: L upper arm pain    R hand dominant    Decrease: Ice, injection,    PLOF: pilates reformer    PMHx: chronic kidney disease    Diagnostics: xray 12/22/2019  1. No acute fracture or dislocation.  2.  Probable rotator cuff tendinopathy.     SPADI: 59/130    AROM BUEs  Flex R tight end-range, L 141  Abd  R tight end-range, L 101  IR R T10, L T12  ER R 102, L 59 (supine at 90abd)    MMT BUEs  Flex R 4/5, L 2/5  Abd R 4/5, L 2/5  IR R 4/5, L 2/5  ER R 4/5, L 2/5  Post Delt 4/5  Mid Trap 3/5  Lower Trap R 3/5, L unable to assess    Special Tests:  Neers (+)  Hawkins-Kennedy (+)  Speeds (+) pain    Palpation: (+) UT, teres major/minor, pectorals               Treatment     Therapeutic Exercises - Justified to address any of the following: develop strength, endurance, ROM and/or flexibility.   ube 5/5 lvl 3    Neuromuscular Re-Education - Justified to address any of the following: of movement, balance, coordination, kinesthetic sense, posture and/or proprioception for sitting and/or  standing activities.   NOT DONE    Manual Therapy - Justified to address any of the following:  Mobilization of joints and soft tissues, manipulation, manual lymphatic drainage, and/or manual traction.    Prone:  Ant GH mobs: grade III, IV    Supine:  Axilla release  Inf mobs: GH: grade III    Manual stretching: ER/IR and ABD L    Therapeutic Activity - Justified to address the following: activities to improve functional performance.  Remeasure    SPADI revision    HEP revision and updated    Goals revision and reviewed POC to achieve remaining goals    Supervised:x 30  GTB:  Wall walks    3#: ROH, RBB 2ways    Prone: Ts x 30:    Home Exercises   HEP: table slides (flex, abd, ER), strap IR , scap squeezes, UT stretches       Tband:   Rows  Pull dwns   Ts         Assessment   MAY 21 ASSESSMENT:  Pt with noted gains in ROM all planes L shldr.  FF and ABD: tight>pain  At endrange.  Pt will cont to focus on scap re-ed and stability for pain free pilates and rot : ER/IR ROM with dressing and grooming.    IE ASSESSMENT 01/06/2020: Pt is a 69 yo F experiencing L shoulder pain x 4 month after starting caring for her grand baby and sleeping at her daughter's home. She is no longer able to do her pilates routine due to pain. At evaluation, she has  limited and painful motion in all planes with scapular weakness. She will benefit from PT to address these deficits in order to care for her grandchild, perform house chores, and return to pilates.  Plan   Cont PT L shldr x more month: 2 x a wk.      Goals    Goal 1: Independent and consistent with her HEP.  MET MAY 21   Sessions: 8      Goal 2: Improve her AROM abd to 160 to be able to reach into overhead cabinets MET MAY 21   Sessions: 8      Goal 3: Improve her AROM flex and abd to 170 MET MAY 21: BUT TIGHT ENDRANGE   Sessions: 16      Goal 4: Improve her SPADI score to 9/130 to be able to carry her newborn grandchild and wash with ease NOT MET MAY 21   Sessions: 16          Goal 5: Improve her pain level to 1/10 at worst with lifting her grandchild, washing her back, and reaching overhead   Sessions: 16      Goal 6: Improve her IR to T10 to wash her back and donn a bra with ease.    Sessions: 16                      Bud Face, PT

## 2020-02-17 ENCOUNTER — Encounter (INDEPENDENT_AMBULATORY_CARE_PROVIDER_SITE_OTHER): Payer: Self-pay | Admitting: Family Medicine

## 2020-02-18 ENCOUNTER — Encounter (INDEPENDENT_AMBULATORY_CARE_PROVIDER_SITE_OTHER): Payer: Self-pay | Admitting: Physical Therapist

## 2020-02-18 ENCOUNTER — Ambulatory Visit (INDEPENDENT_AMBULATORY_CARE_PROVIDER_SITE_OTHER): Payer: BLUE CROSS/BLUE SHIELD | Admitting: Physical Therapist

## 2020-02-18 DIAGNOSIS — M25512 Pain in left shoulder: Secondary | ICD-10-CM

## 2020-02-18 NOTE — PT/OT Therapy Note (Signed)
Name: Victoria Fields Age: 69 y.o.   Date of Service: 02/18/2020  Referring Physician: Phillips Odor, MD   Date of Injury: 08/19/2019  Date Care Plan Established/Reviewed: 02/06/2020  Date Treatment Started: 01/06/2020  End of Certification Date: 03/05/2020  Sessions in Plan of Care: 16  Surgery Date: No data was found    Visit Count: 11   Diagnosis:   1. Acute pain of left shoulder        Subjective     History of Present Illness   History of Present Illness: S:while away in [place] she contd with her exercises and feels like her mobility cont improve; but she is still slightly weak with lifting.  Says she needs to get back to pilates.    Social Support/Occupation      Occupation: retired      Precautions: No data was found  Allergies: Cephalosporins, Strawberry extract, Cefuroxime, Dust mite extract, Molds & smuts, Niacin, Pollen extract, and Tree extract    Objective   RE-EVAL L SHLDR Feb 06 2020    SPADI: 15/130    AROM BUEs  Flex R tight end-range, L 165  Abd R tight end-range, L 168  IR R T10, L T11  ER R 102, L 90 (supine at 90abd)    MMT BUEs  Flex R 4/5, L 4/5  Abd R 4/5, L 4/5  IR R 4/5, L 4/5  ER R 4/5, L 4/5  Post Delt 4/5  Mid Trap 4/5  Lower Trap R 3/5, L 3/5    L Shoulder Evaluation 01/06/2020  MOI: unknown    DOI: Upper arm with exercise the past 3-4 mo. March 1st 2021 sleeping on futon and helping daughter with newborn.     C/O: L upper arm pain    R hand dominant    Decrease: Ice, injection,    PLOF: pilates reformer    PMHx: chronic kidney disease    Diagnostics: xray 12/22/2019  1. No acute fracture or dislocation.  2.  Probable rotator cuff tendinopathy.     SPADI: 59/130    AROM BUEs  Flex R tight end-range, L 141  Abd R tight end-range, L 101  IR R T10, L T12  ER R 102, L 59 (supine at 90abd)    MMT BUEs  Flex R 4/5, L 2/5  Abd R 4/5, L 2/5  IR R 4/5, L 2/5  ER R 4/5, L 2/5  Post Delt 4/5  Mid Trap 3/5  Lower Trap R 3/5, L unable to assess    Special Tests:  Neers (+)  Hawkins-Kennedy  (+)  Speeds (+) pain    Palpation: (+) UT, teres major/minor, pectorals             Treatment     Therapeutic Exercises - Justified to address any of the following: develop strength, endurance, ROM and/or flexibility.   ube retro lvl 3 x     Neuromuscular Re-Education - Justified to address any of the following: of movement, balance, coordination, kinesthetic sense, posture and/or proprioception for sitting and/or standing activities.   Standing foam roll: T y and angels x each     Standing back on wall: blue TB x 30 each     Chest pulls 3 ways    ER 3ways    Diag pull R    Manual Therapy - Justified to address any of the following:  Mobilization of joints and soft tissues, manipulation, manual lymphatic drainage, and/or manual  traction.    Supine:  Axilla release    UT, lev scap DTM    Inf and post GH mobs: grade III    Therapeutic Activity - Justified to address the following: activities to improve functional performance.  2# x 30: prone:  T, I and rows    Home Exercises   HEP: table slides (flex, abd, ER), strap IR , scap squeezes, UT stretches       Tband:   Rows  Pull dwns   Ts      Blue TB: wall walks    Modalities   Skin check completed    Pain and DOMs reduction: es/if w hp x post shldr L: pt supine.       ---      ---   Total Time   Timed Minutes  57 minutes   Total Time  57 minutes        Assessment   MAY 21 ASSESSMENT:  Pt with noted gains in ROM all planes L shldr.  FF and ABD: tight>pain  At endrange.  Pt will cont to focus on scap re-ed and stability for pain free pilates and rot : ER/IR ROM with dressing and grooming.    IE ASSESSMENT 01/06/2020: Pt is a 69 yo F experiencing L shoulder pain x 4 month after starting caring for her grand baby and sleeping at her daughter's home. She is no longer able to do her pilates routine due to pain. At evaluation, she has limited and painful motion in all planes with scapular weakness. She will benefit from PT to address these deficits in  order to care for her grandchild, perform house chores, and return to pilates.     DAILY ASSESSMENT:  Pt noting contd weakness L and R shldr: c/o fatigue with foam roll activities vs increased shldr and upper arm pain.  Needs cues: VC, TC to engage and correct to avoid fwrd head and rounded shldrs.  Plan   Cont PT: progress scap re-ed activities: add bend over rows.      Goals    Goal 1: Independent and consistent with her HEP.  MET MAY 21   Sessions: 8      Goal 2: Improve her AROM abd to 160 to be able to reach into overhead cabinets MET MAY 21   Sessions: 8      Goal 3: Improve her AROM flex and abd to 170 MET MAY 21: BUT TIGHT ENDRANGE   Sessions: 16      Goal 4: Improve her SPADI score to 9/130 to be able to carry her newborn grandchild and wash with ease NOT MET MAY 21   Sessions: 16          Goal 5: Improve her pain level to 1/10 at worst with lifting her grandchild, washing her back, and reaching overhead   Sessions: 16      Goal 6: Improve her IR to T10 to wash her back and donn a bra with ease.    Sessions: 16                      Bud Face, PT

## 2020-02-20 ENCOUNTER — Ambulatory Visit (INDEPENDENT_AMBULATORY_CARE_PROVIDER_SITE_OTHER): Payer: BLUE CROSS/BLUE SHIELD | Admitting: Physical Therapist

## 2020-02-20 DIAGNOSIS — M25512 Pain in left shoulder: Secondary | ICD-10-CM

## 2020-02-23 ENCOUNTER — Ambulatory Visit (INDEPENDENT_AMBULATORY_CARE_PROVIDER_SITE_OTHER): Payer: BLUE CROSS/BLUE SHIELD | Admitting: Physical Therapist

## 2020-02-23 NOTE — PT/OT Therapy Note (Signed)
Name: Victoria Fields Age: 69 y.o.   Date of Service: 02/20/2020  Referring Physician: [name], MD   Date of Injury: 08/19/2019  Date Care Plan Established/Reviewed: 02/06/2020  Date Treatment Started: 01/06/2020  End of Certification Date: 03/05/2020  Sessions in Plan of Care: 16  Surgery Date: No data was found    Visit Count: 12   Diagnosis:   1. Acute pain of left shoulder        Subjective     History of Present Illness   History of Present Illness: S:she has been doing all her exercises; pleased that she can reach behind her back with almost no pain reaching OH: just " tight".    Social Support/Occupation      Occupation: retired      Precautions: No data was found  Allergies: Cephalosporins, Strawberry extract, Cefuroxime, Dust mite extract, Molds & smuts, Niacin, Pollen extract, and Tree extract    Objective   RE-EVAL L SHLDR Feb 06 2020    SPADI: 15/130    AROM BUEs  Flex R tight end-range, L 165  Abd R tight end-range, L 168  IR R T10, L T11  ER R 102, L 90 (supine at 90abd)    MMT BUEs  Flex R 4/5, L 4/5  Abd R 4/5, L 4/5  IR R 4/5, L 4/5  ER R 4/5, L 4/5  Post Delt 4/5  Mid Trap 4/5  Lower Trap R 3/5, L 3/5    L Shoulder Evaluation 01/06/2020  MOI: unknown    DOI: Upper arm with exercise the past 3-4 mo. March 1st 2021 sleeping on futon and helping daughter with newborn.     C/O: L upper arm pain    R hand dominant    Decrease: Ice, injection,    PLOF: pilates reformer    PMHx: chronic kidney disease    Diagnostics: xray 12/22/2019  1. No acute fracture or dislocation.  2.  Probable rotator cuff tendinopathy.     SPADI: 59/130    AROM BUEs  Flex R tight end-range, L 141  Abd R tight end-range, L 101  IR R T10, L T12  ER R 102, L 59 (supine at 90abd)    MMT BUEs  Flex R 4/5, L 2/5  Abd R 4/5, L 2/5  IR R 4/5, L 2/5  ER R 4/5, L 2/5  Post Delt 4/5  Mid Trap 3/5  Lower Trap R 3/5, L unable to assess    Special Tests:  Neers (+)  Hawkins-Kennedy (+)  Speeds (+) pain    Palpation: (+)  UT, teres major/minor, pectorals                 Treatment     Therapeutic Exercises - Justified to address any of the following: develop strength, endurance, ROM and/or flexibility.   4 x corner pec major    3 x1 mins ut stretching bilat    Retro ube lvl 4 x     Neuromuscular Re-Education - Justified to address any of the following: of movement, balance, coordination, kinesthetic sense, posture and/or proprioception for sitting and/or standing activities.   Back on wall x 30  GTB chops    OH AC prgrm     wall push ups    Manual Therapy - Justified to address any of the following:  Mobilization of joints and soft tissues, manipulation, manual lymphatic drainage, and/or manual traction.    Supine:  Axilla release  UT, lev scap DTM    Inf and post GH mobs: grade III    Therapeutic Activity - Justified to address the following: activities to improve functional performance.  Seated in chair blk TB x30    Pull dwns    Rows  Rows ER    Ts    Home Exercises   HEP: table slides (flex, abd, ER), strap IR , scap squeezes, UT stretches       Tband:   Rows  Pull dwns   Ts      Blue TB: wall walks    Modalities   Skin check completed    Pain and DOMs reduction: es/if w hp x post shldr L: pt supine.       ---      ---   Total Time   Timed Minutes  54 minutes   Untimed Minutes  15 minutes   Total Time  69 minutes        Assessment   MAY 21 ASSESSMENT:  Pt with noted gains in ROM all planes L shldr.  FF and ABD: tight>pain  At endrange.  Pt will cont to focus on scap re-ed and stability for pain free pilates and rot : ER/IR ROM with dressing and grooming.    IE ASSESSMENT 01/06/2020: Pt is a 69 yo F experiencing L shoulder pain x 4 month after starting caring for her grand baby and sleeping at her daughter's home. She is no longer able to do her pilates routine due to pain. At evaluation, she has limited and painful motion in all planes with scapular weakness. She will benefit from PT to address these  deficits in order to care for her grandchild, perform house chores, and return to pilates.     DAILY ASSESSMENT:  Pt  Reporting even though she fatigues with exercises; there is hardly any pain L shldr.  Still needing VC, TC to avoid fwrd head posture and rounded shldrs.  Approaching d/c.  Plan   Complete sessions  And focus on re-ed: prep for d/c.      Goals    Goal 1: Independent and consistent with her HEP.  MET MAY 21   Sessions: 8      Goal 2: Improve her AROM abd to 160 to be able to reach into overhead cabinets MET MAY 21   Sessions: 8      Goal 3: Improve her AROM flex and abd to 170 MET MAY 21: BUT TIGHT ENDRANGE   Sessions: 16      Goal 4: Improve her SPADI score to 9/130 to be able to carry her newborn grandchild and wash with ease NOT MET MAY 21   Sessions: 16          Goal 5: Improve her pain level to 1/10 at worst with lifting her grandchild, washing her back, and reaching overhead   Sessions: 16      Goal 6: Improve her IR to T10 to wash her back and donn a bra with ease.    Sessions: 16                      Bud Face, PT

## 2020-02-25 ENCOUNTER — Ambulatory Visit (INDEPENDENT_AMBULATORY_CARE_PROVIDER_SITE_OTHER): Payer: BLUE CROSS/BLUE SHIELD | Admitting: Physical Therapist

## 2020-02-25 DIAGNOSIS — M25512 Pain in left shoulder: Secondary | ICD-10-CM

## 2020-02-27 LAB — LIPID PANEL
Cholesterol / HDL Ratio: 3.8 ratio (ref 0.0–4.4)
Cholesterol: 232 mg/dL — ABNORMAL HIGH (ref 100–199)
HDL: 61 mg/dL (ref >39)
LDL Chol Calculated (NIH): 149 mg/dL — ABNORMAL HIGH (ref 0–99)
Triglycerides: 124 mg/dL (ref 0–149)
VLDL Calculated: 22 mg/dL (ref 5–40)

## 2020-02-27 LAB — HEMOGLOBIN A1C: Hemoglobin A1C: 6 % — ABNORMAL HIGH (ref 4.8–5.6)

## 2020-02-27 NOTE — Progress Notes (Signed)
Lipids and Diabetes risk remain stable at the same level.

## 2020-03-01 ENCOUNTER — Ambulatory Visit (INDEPENDENT_AMBULATORY_CARE_PROVIDER_SITE_OTHER): Payer: BLUE CROSS/BLUE SHIELD | Admitting: Physical Therapist

## 2020-03-01 NOTE — PT/OT Therapy Note (Signed)
Name: Victoria Fields Age: 69 y.o.   Date of Service: 02/25/2020  Referring Physician: [name], MD   Date of Injury: 08/19/2019  Date Care Plan Established/Reviewed: 02/06/2020  Date Treatment Started: 01/06/2020  End of Certification Date: 03/05/2020  Sessions in Plan of Care: 16  Surgery Date: No data was found    Visit Count: 13   Diagnosis:   1. Acute pain of left shoulder        Subjective     History of Present Illness   History of Present Illness: S:she is feeling like she has more pain and stiffness in her neck today: perhaps sleep position was off: also remarks she has more mms soreness from  exercises she has been doing increasing resistance.    Social Support/Occupation      Occupation: retired      Precautions: No data was found  Allergies: Cephalosporins, Strawberry extract, Cefuroxime, Dust mite extract, Molds & smuts, Niacin, Pollen extract, and Tree extract    Objective   RE-EVAL L SHLDR Feb 06 2020    SPADI: 15/130    AROM BUEs  Flex R tight end-range, L 165  Abd R tight end-range, L 168  IR R T10, L T11  ER R 102, L 90 (supine at 90abd)    MMT BUEs  Flex R 4/5, L 4/5  Abd R 4/5, L 4/5  IR R 4/5, L 4/5  ER R 4/5, L 4/5  Post Delt 4/5  Mid Trap 4/5  Lower Trap R 3/5, L 3/5    L Shoulder Evaluation 01/06/2020  MOI: unknown    DOI: Upper arm with exercise the past 3-4 mo. March 1st 2021 sleeping on futon and helping daughter with newborn.     C/O: L upper arm pain    R hand dominant    Decrease: Ice, injection,    PLOF: pilates reformer    PMHx: chronic kidney disease    Diagnostics: xray 12/22/2019  1. No acute fracture or dislocation.  2.  Probable rotator cuff tendinopathy.     SPADI: 59/130    AROM BUEs  Flex R tight end-range, L 141  Abd R tight end-range, L 101  IR R T10, L T12  ER R 102, L 59 (supine at 90abd)    MMT BUEs  Flex R 4/5, L 2/5  Abd R 4/5, L 2/5  IR R 4/5, L 2/5  ER R 4/5, L 2/5  Post Delt 4/5  Mid Trap 3/5  Lower Trap R 3/5, L unable to assess    Special  Tests:  Neers (+)  Hawkins-Kennedy (+)  Speeds (+) pain    Palpation: (+) UT, teres major/minor, pectorals                 Treatment     Therapeutic Exercises - Justified to address any of the following: develop strength, endurance, ROM and/or flexibility.   4 x corner pec major    3 x1 mins ut stretching bilat    Retro ube lvl 4 x     Supine : foam roll: t,y and angels x each    Neuromuscular Re-Education - Justified to address any of the following: of movement, balance, coordination, kinesthetic sense, posture and/or proprioception for sitting and/or standing activities.   NOT DONE    Back on wall x 30  GTB chops    OH AC prgrm     wall push ups    Manual Therapy -  Justified to address any of the following:  Mobilization of joints and soft tissues, manipulation, manual lymphatic drainage, and/or manual traction.    Supine:  Axilla release    UT, lev scap DTM     Prone:   Grade III:PAs: T3-7        Therapeutic Activity - Justified to address the following: activities to improve functional performance.  Seated in chair blk TB x30    Pull dwns    Rows  Rows ER    Ts    Home Exercises   HEP: table slides (flex, abd, ER), strap IR , scap squeezes, UT stretches       Tband:   Rows  Pull dwns   Ts      Blue TB: wall walks    Modalities   Skin check completed    Pain and DOMs reduction: es/if w hp x post shldr L: pt supine.       ---      ---   Total Time   Timed Minutes  64 minutes   Total Time  64 minutes        Assessment   MAY 21 ASSESSMENT:  Pt with noted gains in ROM all planes L shldr.  FF and ABD: tight>pain  At endrange.  Pt will cont to focus on scap re-ed and stability for pain free pilates and rot : ER/IR ROM with dressing and grooming.    IE ASSESSMENT 01/06/2020: Pt is a 69 yo F experiencing L shoulder pain x 4 month after starting caring for her grand baby and sleeping at her daughter's home. She is no longer able to do her pilates routine due to pain. At evaluation, she has  limited and painful motion in all planes with scapular weakness. She will benefit from PT to address these deficits in order to care for her grandchild, perform house chores, and return to pilates.     DAILY ASSESSMENT:  Pt denying shldr pain: still c/o mms soreness and had been advised to vary resistance training for full recovery to accommodate.  Plan   Assess indep with self care and HEP progression.  Still prep for d/c.      Goals    Goal 1: Independent and consistent with her HEP.  MET MAY 21   Sessions: 8      Goal 2: Improve her AROM abd to 160 to be able to reach into overhead cabinets MET MAY 21   Sessions: 8      Goal 3: Improve her AROM flex and abd to 170 MET MAY 21: BUT TIGHT ENDRANGE   Sessions: 16      Goal 4: Improve her SPADI score to 9/130 to be able to carry her newborn grandchild and wash with ease NOT MET MAY 21   Sessions: 16          Goal 5: Improve her pain level to 1/10 at worst with lifting her grandchild, washing her back, and reaching overhead   Sessions: 16      Goal 6: Improve her IR to T10 to wash her back and donn a bra with ease.    Sessions: 16                      Bud Face, PT

## 2020-03-03 ENCOUNTER — Ambulatory Visit (INDEPENDENT_AMBULATORY_CARE_PROVIDER_SITE_OTHER): Payer: BLUE CROSS/BLUE SHIELD | Admitting: Physical Therapist

## 2020-03-08 ENCOUNTER — Ambulatory Visit (INDEPENDENT_AMBULATORY_CARE_PROVIDER_SITE_OTHER): Payer: BLUE CROSS/BLUE SHIELD | Admitting: Physical Therapist

## 2020-03-10 ENCOUNTER — Ambulatory Visit (INDEPENDENT_AMBULATORY_CARE_PROVIDER_SITE_OTHER): Payer: BLUE CROSS/BLUE SHIELD | Admitting: Physical Therapist

## 2020-03-10 DIAGNOSIS — M25512 Pain in left shoulder: Secondary | ICD-10-CM

## 2020-03-11 ENCOUNTER — Encounter (INDEPENDENT_AMBULATORY_CARE_PROVIDER_SITE_OTHER): Payer: Self-pay | Admitting: Family Medicine

## 2020-03-12 NOTE — Progress Notes (Signed)
Name:Victoria Fields Age: 69 y.o.   Date of Service: 03/10/2020  Referring Physician: [name], MD   Date of Injury: 08/19/2019  Date Care Plan Established/Reviewed: 03/10/2020  Date Treatment Started: 01/06/2020  End of Certification Date: 03/05/2020  Sessions in Plan of Care: 16  Surgery Date: No data was found      Visit Count: 14   Diagnosis:   1. Acute pain of left shoulder        Subjective     History of Present Illness   History of Present Illness: S:was able to take care of her grandchild: lift/carry and do her exercises:pleased with her progress: would like revision of her HEP and feels she is ready to be dcd today.    Social Support/Occupation      Occupation: retired      Precautions: No data was found  Allergies: Cephalosporins, Strawberry extract, Cefuroxime, Dust mite extract, Molds & smuts, Niacin, Pollen extract, and Tree extract    Past Medical History:   Diagnosis Date    Allergies     Anemia     exercise induced    Hearing disorder     Hyperlipidemia     Hypertension     Kidney stones     Pap smear for cervical cancer screening 03/05/2017    Thyroid disease        Objective   RE-EVAL L SHLDR jjune 23 2021    SPADI: 3/130    AROM BUEs  Flex R tight end-range, L 165  Abd R tight end-range, L 168  IR R T10, L T11  ER R 102, L 90 (supine at 90abd)  All planes no pain    MMT BUEs  Flex R 4/5, L 4/5  Abd R 4/5, L 4/5  IR R 4/5, L 4/5  ER R 4/5, L 4/5  Post Delt 4/5  Mid Trap 4/5  Lower Trap R 4/5, L 4/5          RE-EVAL L SHLDR Feb 06 2020    SPADI: 15/130    AROM BUEs  Flex R tight end-range, L 165  Abd R tight end-range, L 168  IR R T10, L T11  ER R 102, L 90 (supine at 90abd)    MMT BUEs  Flex R 4/5, L 4/5  Abd R 4/5, L 4/5  IR R 4/5, L 4/5  ER R 4/5, L 4/5  Post Delt 4/5  Mid Trap 4/5  Lower Trap R 3/5, L 3/5    L Shoulder Evaluation 01/06/2020  MOI: unknown    DOI: Upper arm with exercise the past 3-4 mo. March 1st 2021 sleeping on futon and helping daughter with newborn.      C/O: L upper arm pain    R hand dominant    Decrease: Ice, injection,    PLOF: pilates reformer    PMHx: chronic kidney disease    Diagnostics: xray 12/22/2019  1. No acute fracture or dislocation.  2.  Probable rotator cuff tendinopathy.     SPADI: 59/130    AROM BUEs  Flex R tight end-range, L 141  Abd R tight end-range, L 101  IR R T10, L T12  ER R 102, L 59 (supine at 90abd)    MMT BUEs  Flex R 4/5, L 2/5  Abd R 4/5, L 2/5  IR R 4/5, L 2/5  ER R 4/5, L 2/5  Post Delt 4/5  Mid Trap 3/5  Lower Trap R  3/5, L unable to assess    Special Tests:  Neers (+)  Hawkins-Kennedy (+)  Speeds (+) pain    Palpation: (+) UT, teres major/minor, pectorals             Treatment     Therapeutic Exercises - Justified to address any of the following: develop strength, endurance, ROM and/or flexibility.   ube warm up x 4/4 lvl 3    3 x upper back stretching     Neuromuscular Re-Education - Justified to address any of the following: of movement, balance, coordination, kinesthetic sense, posture and/or proprioception for sitting and/or standing activities.   NOT DONE    Manual Therapy - Justified to address any of the following:  Mobilization of joints and soft tissues, manipulation, manual lymphatic drainage, and/or manual traction.    NOT DONE    Therapeutic Activity - Justified to address the following: activities to improve functional performance.  Remeasure    Goal revision    SPADI revision    Self care revision with focus on posture awareness    Supervised HEP progression as pt completed:    Ut, lev scap; inf and post GH capsule stretching 3 x     RTB:  X 30:  Pull dwns    Rows    Rows ER    Ts    Home Exercises   HEP: table slides (flex, abd, ER), strap IR , scap squeezes, UT stretches       Tband:   Rows  Pull dwns   Ts      Blue TB: wall walks       ---      ---   Total Time   Timed Minutes  52 minutes   Untimed Minutes  20 minutes   Total Time  72 minutes        Assessment   March 10 2020  ASSESSMENT: PT goals have been met and she is ready to be dcd    MAY 21 ASSESSMENT:  Pt with noted gains in ROM all planes L shldr.  FF and ABD: tight>pain  At endrange.  Pt will cont to focus on scap re-ed and stability for pain free pilates and rot : ER/IR ROM with dressing and grooming.    IE ASSESSMENT 01/06/2020: Pt is a 69 yo F experiencing L shoulder pain x 4 month after starting caring for her grand baby and sleeping at her daughter's home. She is no longer able to do her pilates routine due to pain. At evaluation, she has limited and painful motion in all planes with scapular weakness. She will benefit from PT to address these deficits in order to care for her grandchild, perform house chores, and return to pilates.  Plan   D/c to HEP.      Goals    Goal 1: Independent and consistent with her HEP.  MET MAY 21   Sessions: 8      Goal 2: Improve her AROM abd to 160 to be able to reach into overhead cabinets MET MAY 21   Sessions: 8      Goal 3: Improve her AROM flex and abd to 170 MET MAY 21: BUT TIGHT ENDRANGE   Sessions: 16      Goal 4: Improve her SPADI score to 9/130 to be able to carry her newborn grandchild and wash with ease NOT MET MAY 21  MET June 23   Sessions: 16  Goal 5: Improve her pain level to 1/10 at worst with lifting her grandchild, washing her back, and reaching overhead   Sessions: 16      Goal 6: Improve her IR to T10 to wash her back and donn a bra with ease.    Sessions: 16                      Bud Face, PT

## 2020-03-12 NOTE — Telephone Encounter (Signed)
Can you assist? Thank you!

## 2020-03-13 ENCOUNTER — Telehealth (INDEPENDENT_AMBULATORY_CARE_PROVIDER_SITE_OTHER): Payer: Self-pay

## 2020-03-13 NOTE — Telephone Encounter (Signed)
A PA has been initiated through [employer] (Key: [id number]) [id number]  Febuxostat 40MG  tablets       Status: PA Response - Approved    Created: June 26th, 2021  Sent: June 26th, 2021    Prior Authorization has been approved based on clinical criteria  The authorization is valid from 02/12/2020 through 09/09/2020    Patient notified via [employer]

## 2020-04-14 ENCOUNTER — Encounter: Payer: Self-pay | Admitting: Gastroenterology

## 2020-04-14 ENCOUNTER — Ambulatory Visit: Payer: BLUE CROSS/BLUE SHIELD | Attending: Gastroenterology

## 2020-04-14 NOTE — Pre-Procedure Instructions (Signed)
Surgical Risk Level : (Low, Intermediate, High)  o low     Surgeon Testing Requirements:  o n/a     Anesthesia Guideline Requirements:  o n/a     Specialist Notes / Test Results / Records Requested:  o n/a     Recent Hospitalization / ED Visit:   o n/a     Future Plan / Upcoming Appts:   o n/a     Labs/Testing @ [facility]SS:   o n/a     Email Sent To Patient:   o Provided PSS email [email or web address] or phone [phone] to patient/family member   o      NPO Instructions given to patient:    o NPO instructions reviewed: Clear liquids up to 2 hours prior to arrival time, then NPO. No solid food 8 hours prior to scheduled procedure time. Examples of clear liquids include water, apple juice, sports drinks such as Gatorade, coffee or tea without milk or cream. Sugar or sweetener may be added  o Fasting Requirements per Preoperative Fasting Guidelines for Elective Surgeries and Procedures Requiring Anesthesia Policy Revised 01/2020.  Ingested material Fasting requirement   Clear liquids/Ice Chips 2 hours prior to arrival time   Breast milk 4 hours prior to scheduled procedure time   Infant formula 6 hours prior to scheduled procedure time   Non-human milk 8 hours prior to scheduled procedure time   Solid food 8 hours prior to scheduled procedure time     o Bowel prep instructions (if applicable):      Faxes Sent To:   o Pharmacy- DOS medication orders (if applicable)  o n/a     Epic Orders Entered:   o Preop Nursing Anesthesia Orders  o n/a     Other Outlying information gathered that does not fit anywhere else  o n/a     Chart Room Handoff for Further  Follow-up if Applicable:  o n/a    Visitor Restriction Guidelines per [facility] as of 04/05/20:  All visitors must adhere to the following:    Exhibit no COVID-19 symptoms    Age 65+ (internally exceptions can be made by administrative team as needed, e.g., siblings, end of life situations, etc.)    In keeping with the CDCs current guidance,  regardless of vaccination status, everyone in a healthcare facility must wear a mask covering their mouth and nose the entire time they are in the facility. Visitors who fail to wear a mask properly will be asked to leave.    The following face coverings cannot be worn at any [place] location: gaiter style masks, bandanas or vented masks.    No visitors are allowed for patients with suspected or confirmed COVID-19, except in end-of-life situations.  Hospital Inpatient:   Visitation hours: 9 a.m. - 6:30 p.m. daily    Adult patients may have two visitors, in addition to a [employer] Person (DSP), if applicable    Pediatric patients may have two parents/guardians at bedside 24/7. For pediatric outpatient areas, only parents/guardians may visit  Outpatient/Ambulatory Surgery:    Adult patients may have one visitor, in addition to their Designated Support Person (DSP) on the day of surgery.    No family members will be allowed into Phase 1 recovery areas. Physicians will call contact person to give report of procedure.    Family / visitor will be called by PACU staff  to review discharge instructions via phone and answer any questions.     Advise to  call surgeon if need to cancel surgery arises.      Patient verbalized understanding and acceptance of above information.

## 2020-04-18 NOTE — Progress Notes (Signed)
Subjective:      Patient ID: Victoria Fields is a 68 y.o. female     Chief Complaint   Patient presents with    Follow-up        She is seen for ch care recheck.  Was here last in 12/2019.  -Divides her time helping caring for grandchildren in [place], [place], and [place]. The daughter in [place] had a difficult labor for her first baby at the end of 12-14-22. She stayed to help over 4 weeks.   -Hurt left shoulder while in [place] from carrying the baby.  Saw Sports Med and had inj. Had PT.    HM  Smoking: in college.  Alcohol: not much.  Diet: balanced  Exercise: regualrly  PAP with HPV was done in 02/2017.  Mammo 09/2019.  Colonoscopy just had update, polyps, Dr. Selena Batten.  Immuniz: UTD  FH of colon, breast, ovary, skin CA: no.  FH of DM, heart d: brother passed away with MI in 12/15/15. He had PKD and was on dialysis. Daughter with Alport synd.    For ch care:  1)Periph neuropathy  Noted bleeding on her foot that she did not realize that she had decreased sensation of her feet.  She had a Neurological eval per Dr. Sue Lush in the past.  Saw Dr. Jovita Gamma 12/2019 and had NCS 01/2020 showed progressed sensory and motor neuropathy bilat LE's. Was found with low B12, now on suppl. Also on Lyrica.    2)CKD 3, renal cyst  BUN/Creat 28/1.46 in 05/2016. eGFR 38, 36/1.73 in 14-Dec-2016.  27/1.39 in 12/2016.  21/1.77 in 09/2017, eGFR 30, K 5.6. Was instructed to cut Lisinopril to 20 mg.  24/1.65 in December 14, 2017, eGFR 32, K 5.1.  35/1.58 in 12/2017,  18/1.49 in 03/2018, eGFR 37, K 5.1.  Creat 1.55 on 3/17,  28/1.5 in 04/2019.  22/1.38 in 09/2019.  Abd/pelvic CT 2012 left renal cyst 5 cm.  Renal sono 03/2017, exophytic cyst x 2 largest at 8 cm.  24 hr urine pro 288, cr cl 49 in 03/2018.   Saw Dr. Tereso Newcomer 02/2020, note and BW result reviewed. Na Bicarb had been added, since her CO2 was 17.  CBC nl in 09/2019.  Urine microalbumin ratio 1115 in 04/2019, 1459 in 09/2019.    2)Hyperuricemia, kidney stone  On Uloric 40  Uric acid: 4.7 in 12/2017, 3.9 in 03/2018.    3)Hypertension:  On  Lisinopril 20 mg, Cartia XT 120 by Dr. Tereso Newcomer. Off Amlodipine.  Home BP: 90's/50's to 130's/70's, notes fatigue when low.  EST in 02/2015 nl.   Victoria in 01/2015 showed mild LVH and tr tricuspid regurg and mild aortic valve sclerosis without stenosis    4)Hyperlipidemia, fatty liver  On Zetia 10, Simvastatin 60, Lovaza 2 gm.  Change to Crestor 10 caused joint pain.  Lipids: 232/124/61/149 in 02/2020, 222/202/46/140 in 09/2019, 188/126/46/117 in 12-14-2018, 185/148/54/101 in December 14, 2017, 214/212/47/125 in 12-14-16.  CMP with ALT 33 in 12/14/17 and 03/2018, 49 in 04/2019, 38 in 09/2019.    5)Hypothyroidism:  Currently taking 50 mcg of brand name Synthroid daily.  TSH in a good range 09/2019.  Sono 12/2017, nodular thyroid without change.    6)IFG  A1C 6.0 in December 14, 2016, 5.9 in 09/2017, 5.8 in 03/2018, 6.1 in 2018-12-14, 5.9 in 04/2019, 6.0 in 02/2020.    7)GERD, Barrett's since 2013  Back on Prevacid, off Nexium due to nausea., Pepcid PRN.  EGD 04/2018 Dr. Lilia Pro, no Barretts, but + esophagitis despite Prevacid.  Just had  update, no Barretts.  Symptoms: none as long as she is on med and stays on diet. Na bicarb helping.    8)OSA  mod non-positional OSA per home testing 01/2018. Was referred for titration study, but she can't keep CPAP on.    9)Exercise induced asthma, allergy  Xyzal, Flonase, Flovent PRN    10)Osteopenia  DEXA T score -1.8 fem neck in 09/2019, -1.7 in 05/2017, -1.5 in 2011.  Vit D 41.7 in 10/2018.    11)BMI>30  Weight change: 10 pounds loss, was 213 in 12/2019.       The following sections were reviewed this encounter by the provider:   Tobacco   Allergies   Meds   Problems   Med Hx   Surg Hx   Fam Hx          Review of Systems   Constitutional: Negative for unexpected weight change.   Eyes: Negative for visual disturbance.   Respiratory: Negative for shortness of breath.    Cardiovascular: Negative for chest pain, palpitations and leg swelling.   Gastrointestinal: Negative for abdominal pain, constipation and diarrhea.   Genitourinary:  Negative for dysuria and frequency.   Musculoskeletal: Negative for arthralgias and myalgias.          BP 130/82    Pulse 64    Temp 99.1 F (37.3 C)    Resp 18    Ht 1.778 m (5\' 10" )    Wt 92.5 kg (204 lb)    BMI 29.27 kg/m     Objective:     Physical Exam  Constitutional:       General: She is not in acute distress.     Appearance: Normal appearance. She is not ill-appearing.   HENT:      Head: Normocephalic and atraumatic.      Nose: Nose normal.   Eyes:      Extraocular Movements: Extraocular movements intact.      Conjunctiva/sclera: Conjunctivae normal.   Pulmonary:      Effort: Pulmonary effort is normal. No respiratory distress.   Musculoskeletal:         General: No swelling or tenderness.      Right lower leg: No edema.      Left lower leg: No edema.      Comments: Left shoulder decreased ROM. Has pain with LUE rotation to the back.  Sore with resisted LUE abduction   Skin:     Comments: Left lateral thigh, 1/2 cm dry erythematous lesion with warty appearance.   Neurological:      General: No focal deficit present.      Mental Status: She is alert.      Gait: Gait normal.   Psychiatric:         Mood and Affect: Mood normal.         Behavior: Behavior normal.          Assessment:     1. Benign essential hypertension    2. Polyneuropathy associated with underlying disease  - Vitamin B12    3. Vitamin B12 deficiency  - Vitamin B12    4. Impaired fasting glucose  - Hemoglobin A1C    5. Alport syndrome-like hereditary nephritis    6. Stage 3a chronic kidney disease    7. Other hyperlipidemia  - Lipid panel    8. Hyperuricemia    9. Other specified hypothyroidism  - TSH    10. Mild intermittent reactive airway disease without complication  - albuterol sulfate  HFA (Ventolin HFA) 108 (90 Base) MCG/ACT inhaler; Inhale 2 puffs into the lungs every 4 (four) hours as needed for Wheezing or Shortness of Breath  Dispense: 1 Inhaler; Refill: 3    11. Obesity with body mass index 30 or greater        Plan:     Please  continue working on quality low fat low sugar diet as well as regular exercises.    Reduce dose of Lisinopril to 10 mg daily and monitor BP at home.  Desirable range of BP is 110/70-135/85  Please call if consistently out of range.    Please have this blood test done after 8 hours fasting in December when having labs done with Dr. Wenda Overland order.  You may drink water and take your medications.    Continue Vitamin B12 oral supplement daily.    Review of outside records and visit today took place over 40 min.    Amon Costilla Paschal Dopp, MD

## 2020-04-21 ENCOUNTER — Ambulatory Visit: Payer: BLUE CROSS/BLUE SHIELD | Admitting: Anesthesiology

## 2020-04-21 ENCOUNTER — Encounter: Payer: Self-pay | Admitting: Gastroenterology

## 2020-04-21 ENCOUNTER — Encounter
Admission: RE | Disposition: A | Discharge status: Home / Self Care | Payer: Self-pay | Source: Ambulatory Visit | Attending: Gastroenterology

## 2020-04-21 ENCOUNTER — Ambulatory Visit: Payer: Self-pay

## 2020-04-21 ENCOUNTER — Ambulatory Visit
Admission: RE | Admit: 2020-04-21 | Discharge: 2020-04-21 | Disposition: A | Discharge status: Home / Self Care | Payer: BLUE CROSS/BLUE SHIELD | Source: Ambulatory Visit | Attending: Gastroenterology | Admitting: Gastroenterology

## 2020-04-21 DIAGNOSIS — E785 Hyperlipidemia, unspecified: Secondary | ICD-10-CM | POA: Insufficient documentation

## 2020-04-21 DIAGNOSIS — I10 Essential (primary) hypertension: Secondary | ICD-10-CM | POA: Insufficient documentation

## 2020-04-21 DIAGNOSIS — D649 Anemia, unspecified: Secondary | ICD-10-CM | POA: Insufficient documentation

## 2020-04-21 DIAGNOSIS — Q8781 Alport syndrome: Secondary | ICD-10-CM | POA: Insufficient documentation

## 2020-04-21 DIAGNOSIS — Z86010 Personal history of colonic polyps: Secondary | ICD-10-CM | POA: Insufficient documentation

## 2020-04-21 DIAGNOSIS — Z1211 Encounter for screening for malignant neoplasm of colon: Secondary | ICD-10-CM | POA: Insufficient documentation

## 2020-04-21 DIAGNOSIS — K219 Gastro-esophageal reflux disease without esophagitis: Secondary | ICD-10-CM

## 2020-04-21 DIAGNOSIS — J45990 Exercise induced bronchospasm: Secondary | ICD-10-CM | POA: Insufficient documentation

## 2020-04-21 DIAGNOSIS — D122 Benign neoplasm of ascending colon: Secondary | ICD-10-CM | POA: Insufficient documentation

## 2020-04-21 DIAGNOSIS — E039 Hypothyroidism, unspecified: Secondary | ICD-10-CM | POA: Insufficient documentation

## 2020-04-21 DIAGNOSIS — K635 Polyp of colon: Secondary | ICD-10-CM | POA: Insufficient documentation

## 2020-04-21 DIAGNOSIS — K2100 Gastro-esophageal reflux disease with esophagitis, without bleeding: Secondary | ICD-10-CM | POA: Insufficient documentation

## 2020-04-21 DIAGNOSIS — K5730 Diverticulosis of large intestine without perforation or abscess without bleeding: Secondary | ICD-10-CM | POA: Insufficient documentation

## 2020-04-21 DIAGNOSIS — K449 Diaphragmatic hernia without obstruction or gangrene: Secondary | ICD-10-CM | POA: Insufficient documentation

## 2020-04-21 HISTORY — DX: Disorder of kidney and ureter, unspecified: N28.9

## 2020-04-21 HISTORY — DX: Unspecified hearing loss, unspecified ear: H91.90

## 2020-04-21 HISTORY — DX: Unspecified asthma, uncomplicated: J45.909

## 2020-04-21 HISTORY — DX: Polyneuropathy, unspecified: G62.9

## 2020-04-21 HISTORY — DX: Claustrophobia: F40.240

## 2020-04-21 HISTORY — DX: Hypothyroidism, unspecified: E03.9

## 2020-04-21 HISTORY — DX: Unspecified cataract: H26.9

## 2020-04-21 HISTORY — DX: Unspecified visual disturbance: H53.9

## 2020-04-21 HISTORY — DX: Cardiac murmur, unspecified: R01.1

## 2020-04-21 HISTORY — DX: Gastro-esophageal reflux disease without esophagitis: K21.9

## 2020-04-21 HISTORY — PX: EGD, COLONOSCOPY: SHX3799

## 2020-04-21 HISTORY — DX: Encounter for other specified aftercare: Z51.89

## 2020-04-21 HISTORY — DX: Soft tissue disorder, unspecified: M79.9

## 2020-04-21 SURGERY — EGD, COLONOSCOPY
Anesthesia: Anesthesia General | Site: Abdomen

## 2020-04-21 MED ORDER — PROPOFOL INFUSION 10 MG/ML
INTRAVENOUS | Status: DC | PRN
Start: 2020-04-21 — End: 2020-04-21
  Administered 2020-04-21: 50 mg via INTRAVENOUS
  Administered 2020-04-21: 30 mg via INTRAVENOUS
  Administered 2020-04-21: 20 mg via INTRAVENOUS
  Administered 2020-04-21: 30 mg via INTRAVENOUS
  Administered 2020-04-21: 40 mg via INTRAVENOUS
  Administered 2020-04-21: 70 mg via INTRAVENOUS

## 2020-04-21 MED ORDER — PROPOFOL 10 MG/ML IV EMUL (WRAP)
INTRAVENOUS | Status: AC
Start: 2020-04-21 — End: ?
  Filled 2020-04-21: qty 20

## 2020-04-21 MED ORDER — LIDOCAINE HCL 2 % IJ SOLN
INTRAMUSCULAR | Status: DC | PRN
Start: 2020-04-21 — End: 2020-04-21
  Administered 2020-04-21: 60 mg

## 2020-04-21 MED ORDER — LACTATED RINGERS IV SOLN
INTRAVENOUS | Status: DC | PRN
Start: 2020-04-21 — End: 2020-04-21

## 2020-04-21 SURGICAL SUPPLY — 38 items
BLOCK BITE MAXI 60FR LF STRD STRAP SDPRT (Procedure Accessories) ×1
BLOCK BITE OD60 FR STURDY STRAP SIDEPORT (Procedure Accessories) ×1
BLOCK BITE OD60 FR STURDY STRAP SIDEPORT DENTAL RETENTION RIM MAXI (Procedure Accessories) ×1 IMPLANT
ELECTRODE ADULT PATIENT RETURN L9 FT REM POLYHESIVE ACRYLIC FOAM (Procedure Accessories) IMPLANT
ELECTRODE PATIENT RETURN L9 FT VALLEYLAB (Procedure Accessories)
ELECTRODE PT RTN RM PHSV ACRL FM C30- LB (Procedure Accessories)
FORCEPS BIOPSY L240 CM JUMBO MICROMESH (Instrument) ×1
FORCEPS BIOPSY L240 CM JUMBO MICROMESH TEETH STREAMLINE CATHETER (Instrument) IMPLANT
FORCEPS BIOPSY L240 CM LARGE CAPACITY (Instrument) ×1
FORCEPS BIOPSY L240 CM MICROMESH TEETH STREAMLINE CATHETER NEEDLE (Instrument) IMPLANT
FORCEPS BX SS JMB RJ 4 2.8MM 240CM STRL (Instrument) ×1
FORCEPS BX SS LG CPC RJ 4 2.4MM 240CM (Instrument) ×1
GLOVE EXAM LARGE NITRILE CHEMOTHERAPY POWDER FREE SENSE OATMEAL (Glove) ×1 IMPLANT
GLOVE EXAM LARGE NITRILE POWDER FREE SENSE OATMEAL (Glove) ×1 IMPLANT
GLOVE EXAM NITRILE RESTORE LG (Glove) ×1
GLV EXAM NITRILE RESTORE LG (Glove) ×2
GOWN ISL PP PE REG LG LF FULL BCK NK TIE (Gown) ×4
GOWN ISOLATION REGULAR LARGE FULL BACK NECK TIE ELASTIC CUFF (Gown) ×2 IMPLANT
MASK FACE FM FLDSHLD LF LVL 3 TIE EYSHLD (Personal Protection) ×4 IMPLANT
NEEDLE SCLEROTHERAPY CARR-LOCKE OD25 GA ODSEC2.5 MM L230 CM INJECTION (Needles) IMPLANT
NEEDLE SCLEROTHERAPY OD25 GA ODSEC2.5 MM (Needles)
NEEDLE SCLRTX SS TFLN CRLK 25GA 2.5MM (Needles)
SNARE ESCP MIC CPTVTR 13MM 240IN STRL (GE Lab Supplies) ×2
SNARE SMALL HEXAGON CAPTIVATOR STIFF ENDOSCOPIC POLYPECTOMY (GE Lab Supplies) IMPLANT
SPONGE GAUZE L4 IN X W4 IN 16 PLY (Dressing) ×1
SPONGE GAUZE L4 IN X W4 IN 16 PLY MAXIMUM ABSORBENT USP TYPE VII (Dressing) ×1 IMPLANT
SPONGE GZE CTTN CRTY 4X4IN LF NS 16 PLY (Dressing) ×1
SYRINGE 50 ML GRADUATE NONPYROGENIC DEHP (Syringes, Needles) ×1
SYRINGE 50 ML GRADUATE NONPYROGENIC DEHP FREE PVC FREE BD MEDICAL (Syringes, Needles) ×1 IMPLANT
SYRINGE MED 50ML LF STRL GRAD N-PYRG (Syringes, Needles) ×1
TRAP MCS PLS LF STRL SCR CAP TUBE ID LBL (Procedure Accessories)
TRAP MUCUS SCREW CAP TUBE ID LABEL (Procedure Accessories)
TRAP MUCUS SCREW CAP TUBE ID LABEL MEDLINE PLASTIC CLEAR (Procedure Accessories) IMPLANT
TRAP SPEC REM ETRAP 15CM LF STRL MAGNIFY (Procedure Accessories) ×1
TRAP SPECIMEN REMOVAL L15 CM MAGNIFY (Procedure Accessories) ×1
TRAP SPECIMEN REMOVAL L15 CM MAGNIFY WINDOW MEASUREMENT GUIDE ETRAP (Procedure Accessories) IMPLANT
WATER STERILE PLASTIC POUR BOTTLE 250 ML (Irrigation Solutions) ×1 IMPLANT
WATER STRL 250ML LF PLS PR BTL (Irrigation Solutions) ×1

## 2020-04-21 NOTE — Anesthesia Preprocedure Evaluation (Signed)
Anesthesia Evaluation    AIRWAY    Mallampati: I    TM distance: >3 FB  Neck ROM: full  Mouth Opening:full   CARDIOVASCULAR    cardiovascular exam normal, regular and normal       DENTAL    no notable dental hx     PULMONARY    pulmonary exam normal and clear to auscultation     OTHER FINDINGS                  Relevant Problems   ANESTHESIA   (+) Obstructive sleep apnea syndrome      PULMONARY   (+) Obstructive sleep apnea syndrome      CARDIO   (+) Benign essential hypertension   (+) Mitral valve regurgitation      GU/RENAL   (+) Alport syndrome-like hereditary nephritis   (+) Fatty liver   (+) Kidney stone   (+) Stage 3a chronic kidney disease      ENDO   (+) Hypothyroidism               Anesthesia Plan    ASA 2     general               (Risk and benefits of anesthesia explained to patient. Questions answered, consent obtained.  )      intravenous induction   Detailed anesthesia plan: general IV            informed consent obtained                   Signed by: Gaylyn Rong 04/21/20 9:48 AM

## 2020-04-21 NOTE — Discharge Instr - AVS First Page (Signed)
Reason for your Hospital Admission:  EGD, COLONOSCOPY      Instructions for after your discharge:  Endoscopy Discharge Instructions  General Instructions:  1. Following sedation, your judgement, perception, and coordination are considered impaired. Even though you may feel awake and alert, you are considered legally intoxicated. Therefore, until the next morning;   Do not Drive   Do not operate appliances or equipment that requires reaction time (e.g. stove, electrical tools, machinery)   Do not sign legal documents or be involved in important decisions.   Do not smoke if alone   Do not drink alcoholic beverages   Go directly home and rest for several hours before resuming your routine activities.   It is highly recommended to have a responsible adult stay with you for the next 24 hours    2. Tenderness, swelling or pain may occur at the IV site where you received sedation. If you experience this, apply warm soaks to the area. Notify your physician if this persists.    Instructions Specific To Procedures - Report To Physician Any Of The Following:    Upper Endoscopy, ERCP, Dilations   1. Pain in Chest   2. Nausea/vomitting   3. Fevers/Chills within 24 hours after procedure. Temp>101deg F   4. Severe and persistent abdominal pain and bloating     In Addition:   Mild throat soreness may follow this procedure. Warm salt water gargling or    lozenges of your choice will most likely relieve your discomfort or cold drinks and   popsicles.     Colon/Sigmoidoscopy/Proctoscopy   1. Severe and persistent abdominal pain/bloating which does not subside within   2-3 hours   2. Large amount of rectal bleeding (some mucosal blood streaking may occur,   especially if biopsy or polypectomy was done or if hemorrhoids are present.   3. Nausea/vomitting   4. Fevers/Chills within 24 hours after procedure. Temp>101deg F     In Addition:   If polyp has been removed, DO NOT take aspirin or aspirin containing products   (e.g. Anacin,  Alka Seltzer, Bufferin, Etc.) or non-steroidal anti-inflammatory drugs   (e.g. Advil, Motrin, etc.) for 3 days unless otherwise advised by doctor. Tylenol   or extra Strength Tylenol is permitted.    Additional Discharge Instructions  Your diet after the procedure:Start with liquids then advance diet as tolerated  Special Instructions: Nothing spicy or greasy. NO RED colored drinks food or sauces for 24 hours  Prescriptions given:None  Patient education literature given;yes      If you have questions or problems contact your MD immediately. If you need immediate attention, call your MD, 911 and/or go to nearest emergency room.

## 2020-04-21 NOTE — H&P (Signed)
GI PRE PROCEDURE NOTE    Proceduralist Comments:   Review of Systems and Past Medical / Surgical History performed: Yes     Indications:GERD  and History of colonic polyps    Previous Adverse Reaction to Anesthesia or Sedation (if yes, describe): No    Physical Exam / Laboratory Data (If applicable)   Airway Classification: Class II    General: Alert and cooperative  Lungs: Lungs clear to auscultation  Cardiac: RRR, normal S1S2.    Abdomen: Soft, non tender. Normal active bowel sounds  Other:     No labs drawn    American Society of Anesthesiologists (ASA) Physical Status Classification:   ASA 2 - Patient with mild systemic disease with no functional limitations    Planned Sedation:   Deep sedation with anesthesia    Attestation:   Victoria Fields has been reassessed immediately prior to the procedure and is an appropriate candidate for the planned sedation and procedure. Risks, benefits and alternatives to the planned procedure and sedation have been explained to the patient or guardian:  yes        Signed by: Konrad Saha

## 2020-04-21 NOTE — Anesthesia Postprocedure Evaluation (Signed)
Anesthesia Post Evaluation    Patient: Victoria Fields    Procedure(s) with comments:  EGD, COLONOSCOPY - EGD, COLONOSCOPY  Asst=N    Anesthesia type: general    Last Vitals:   Vitals Value Taken Time   BP 120/66 04/21/20 1100   Pulse 55 04/21/20 1100   Resp 16 04/21/20 1100   SpO2 96 % 04/21/20 1100                 Anesthesia Post Evaluation:     Patient Evaluated: bedside  Patient Participation: complete - patient cannot participate  Level of Consciousness: awake and alert  Pain Score: 0  Pain Management: adequate    Airway Patency: patent    Anesthetic complications: No      PONV Status: none    Cardiovascular status: acceptable  Respiratory status: acceptable  Hydration status: acceptable        Signed by: Gaylyn Rong, 04/21/2020 11:11 AM

## 2020-04-21 NOTE — Transfer of Care (Signed)
Anesthesia Transfer of Care Note    Patient: Victoria Fields    Procedures performed: Procedure(s) with comments:  EGD, COLONOSCOPY - EGD, COLONOSCOPY  Verbal report/handoff to pacu RN, patient comfortable, no complaints/VS stable.Marland KitchenNAD. Airway maintained.    Signed by: Jannette Fogo Ameet Sandy  04/21/20 10:35 AM

## 2020-04-22 ENCOUNTER — Encounter: Payer: Self-pay | Admitting: Gastroenterology

## 2020-04-23 ENCOUNTER — Encounter (INDEPENDENT_AMBULATORY_CARE_PROVIDER_SITE_OTHER): Payer: Self-pay | Admitting: Family Medicine

## 2020-04-23 ENCOUNTER — Ambulatory Visit (INDEPENDENT_AMBULATORY_CARE_PROVIDER_SITE_OTHER): Payer: BLUE CROSS/BLUE SHIELD | Admitting: Family Medicine

## 2020-04-23 ENCOUNTER — Telehealth (INDEPENDENT_AMBULATORY_CARE_PROVIDER_SITE_OTHER): Payer: Self-pay | Admitting: Family Medicine

## 2020-04-23 VITALS — BP 130/82 | HR 64 | Temp 99.1°F | Resp 18 | Ht 70.0 in | Wt 204.0 lb

## 2020-04-23 DIAGNOSIS — E038 Other specified hypothyroidism: Secondary | ICD-10-CM

## 2020-04-23 DIAGNOSIS — E790 Hyperuricemia without signs of inflammatory arthritis and tophaceous disease: Secondary | ICD-10-CM

## 2020-04-23 DIAGNOSIS — N1831 Chronic kidney disease, stage 3a: Secondary | ICD-10-CM

## 2020-04-23 DIAGNOSIS — J4520 Mild intermittent asthma, uncomplicated: Secondary | ICD-10-CM

## 2020-04-23 DIAGNOSIS — E669 Obesity, unspecified: Secondary | ICD-10-CM

## 2020-04-23 DIAGNOSIS — Q8781 Alport syndrome: Secondary | ICD-10-CM

## 2020-04-23 DIAGNOSIS — E7849 Other hyperlipidemia: Secondary | ICD-10-CM

## 2020-04-23 DIAGNOSIS — E538 Deficiency of other specified B group vitamins: Secondary | ICD-10-CM

## 2020-04-23 DIAGNOSIS — R7301 Impaired fasting glucose: Secondary | ICD-10-CM

## 2020-04-23 DIAGNOSIS — I10 Essential (primary) hypertension: Secondary | ICD-10-CM

## 2020-04-23 DIAGNOSIS — G63 Polyneuropathy in diseases classified elsewhere: Secondary | ICD-10-CM

## 2020-04-23 LAB — LAB USE ONLY - HISTORICAL SURGICAL PATHOLOGY

## 2020-04-23 MED ORDER — ALBUTEROL SULFATE HFA 108 (90 BASE) MCG/ACT IN AERS
2.0000 | INHALATION_SPRAY | RESPIRATORY_TRACT | 3 refills | Status: DC | PRN
Start: 2020-04-23 — End: 2023-03-19

## 2020-04-23 NOTE — Patient Instructions (Signed)
Please continue working on quality low fat low sugar diet as well as regular exercises.    Reduce dose of Lisinopril to 10 mg daily and monitor BP at home.  Desirable range of BP is 110/70-135/85  Please call if consistently out of range.    Please have this blood test done after 8 hours fasting in December when having labs done with Dr. Wenda Overland order.  You may drink water and take your medications.    Continue Vitamin B12 oral supplement daily.

## 2020-04-23 NOTE — Telephone Encounter (Signed)
Please request Dr. Tereso Newcomer (Nephrology) and Dr. Jovita Gamma (Neurology) for most recent labs on this pt.

## 2020-04-27 NOTE — Telephone Encounter (Signed)
Request has been faxed over:  Dr [facility] Fax- [phone]  Dr [facility]io[facility] Fax- [phone]

## 2020-04-28 ENCOUNTER — Encounter (INDEPENDENT_AMBULATORY_CARE_PROVIDER_SITE_OTHER): Payer: Self-pay | Admitting: Family Medicine

## 2020-04-29 ENCOUNTER — Encounter (INDEPENDENT_AMBULATORY_CARE_PROVIDER_SITE_OTHER): Payer: Self-pay | Admitting: Family Medicine

## 2020-05-10 ENCOUNTER — Encounter (INDEPENDENT_AMBULATORY_CARE_PROVIDER_SITE_OTHER): Payer: Self-pay | Admitting: Family Medicine

## 2020-06-02 IMAGING — MG DIGITAL SCREENING BILAT W/ TOMO W/ CAD
8 series · 8 of 24 positions shown · non-contrast
Comparison: Previous exam(s).

CLINICAL DATA: Screening.

EXAM:
DIGITAL SCREENING BILATERAL MAMMOGRAM WITH TOMO AND CAD

[R MLO synth-2D]
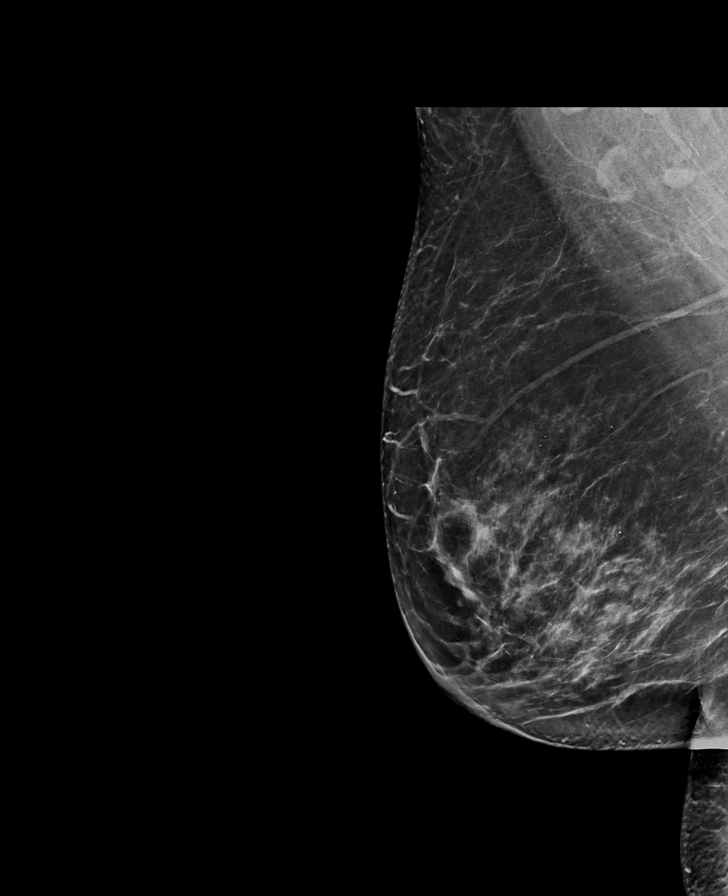

[R CC synth-2D]
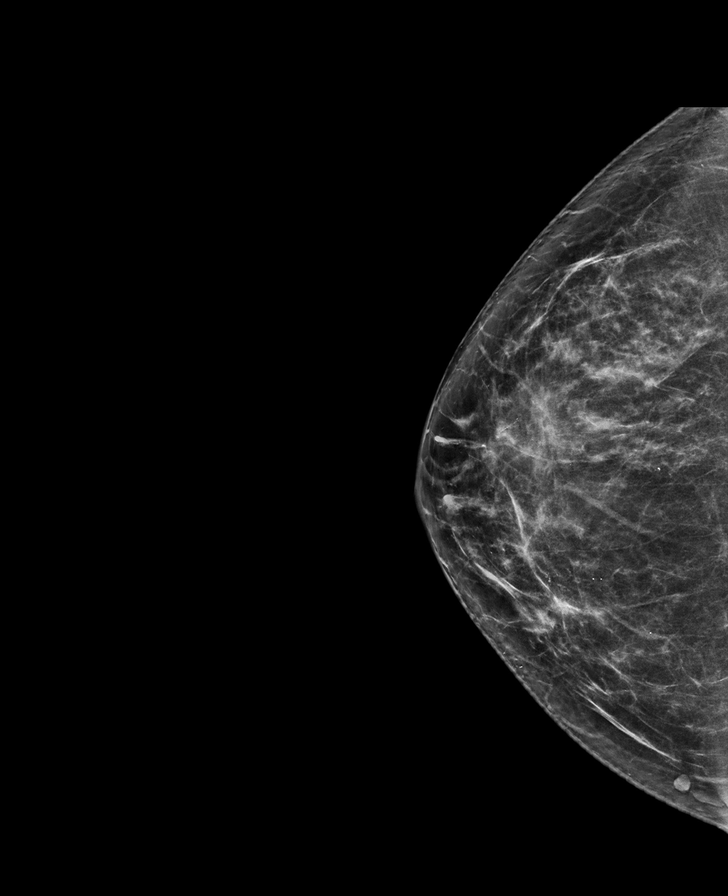

[L MLO synth-2D]
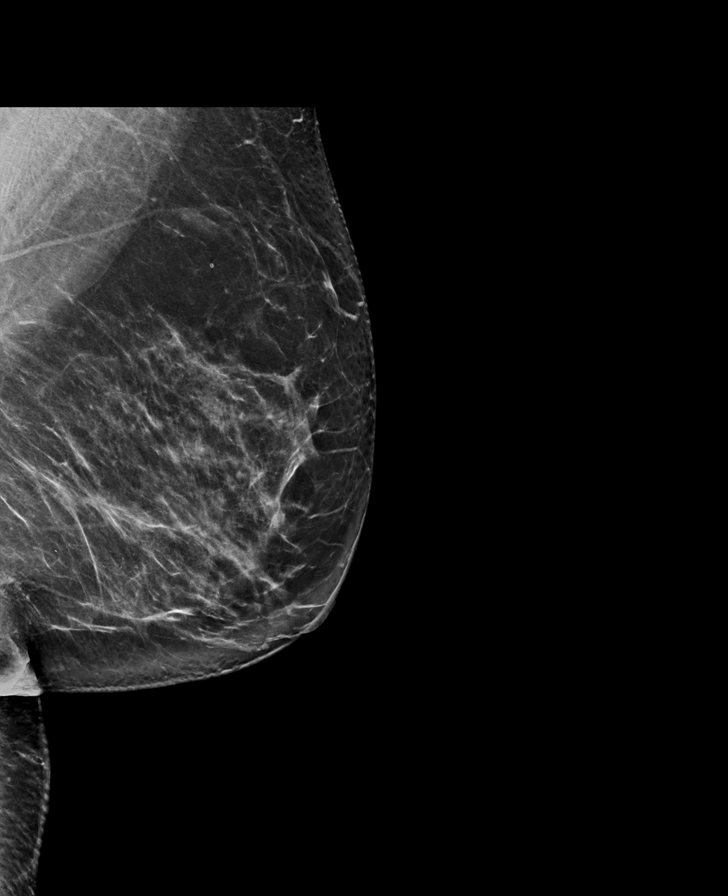

[L CC synth-2D]
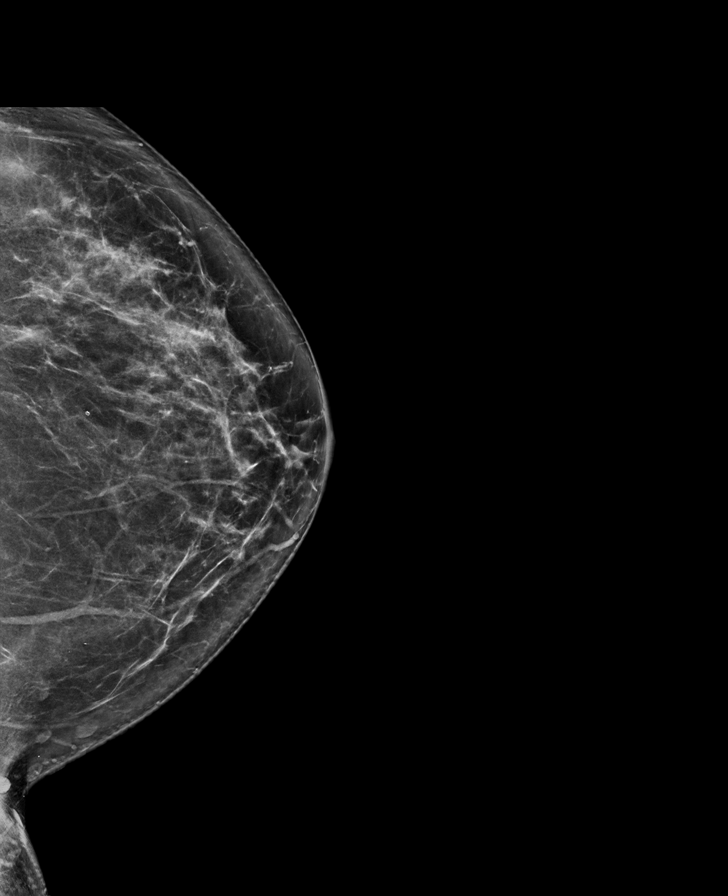

[R CC tomo · tomo slice 38/75.0]
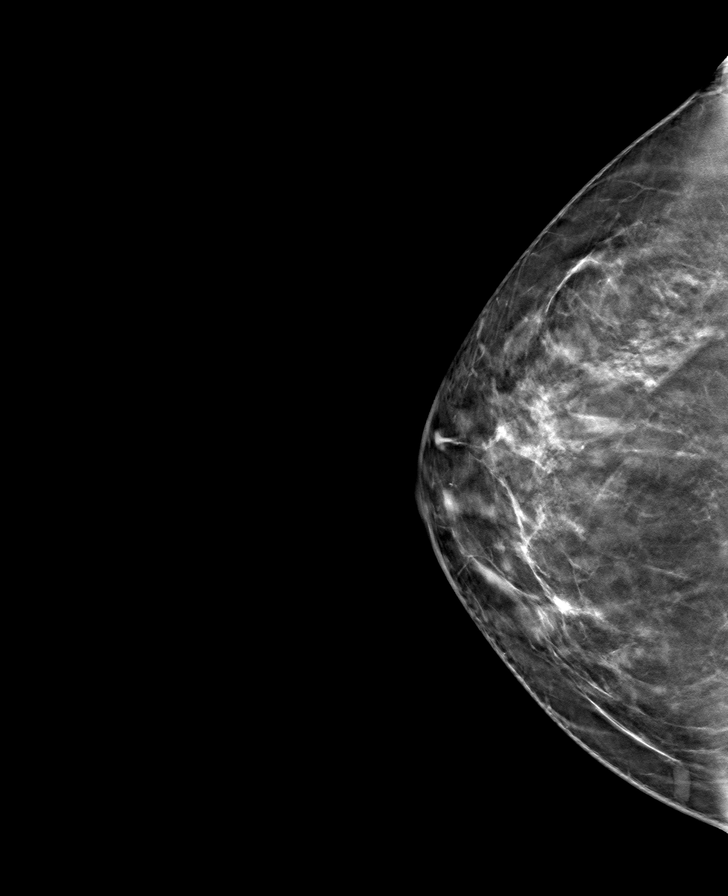

[L MLO tomo · tomo slice 43/86.0]
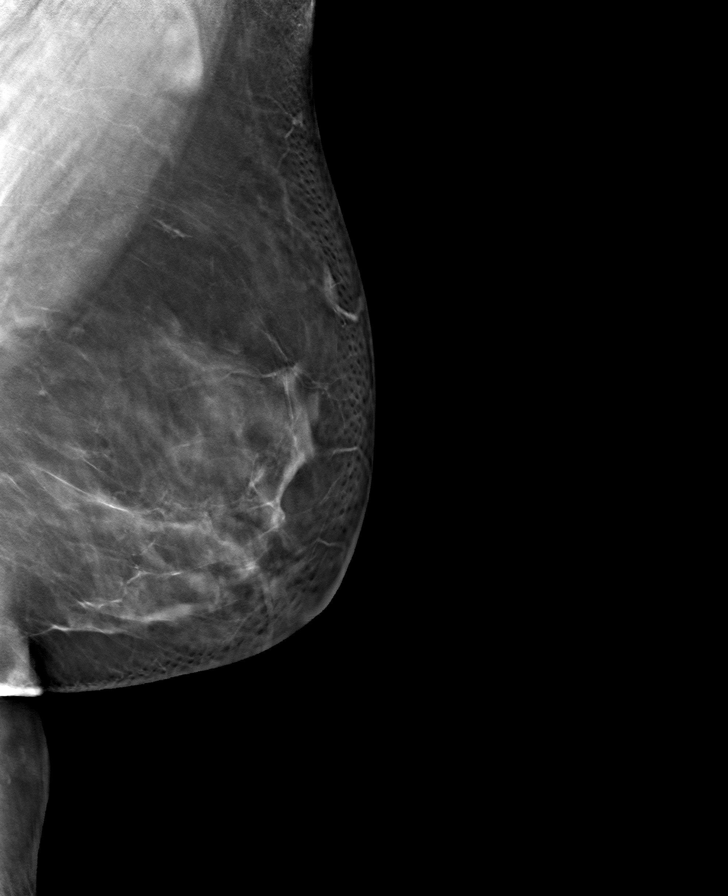

[L CC tomo · tomo slice 41/81.0]
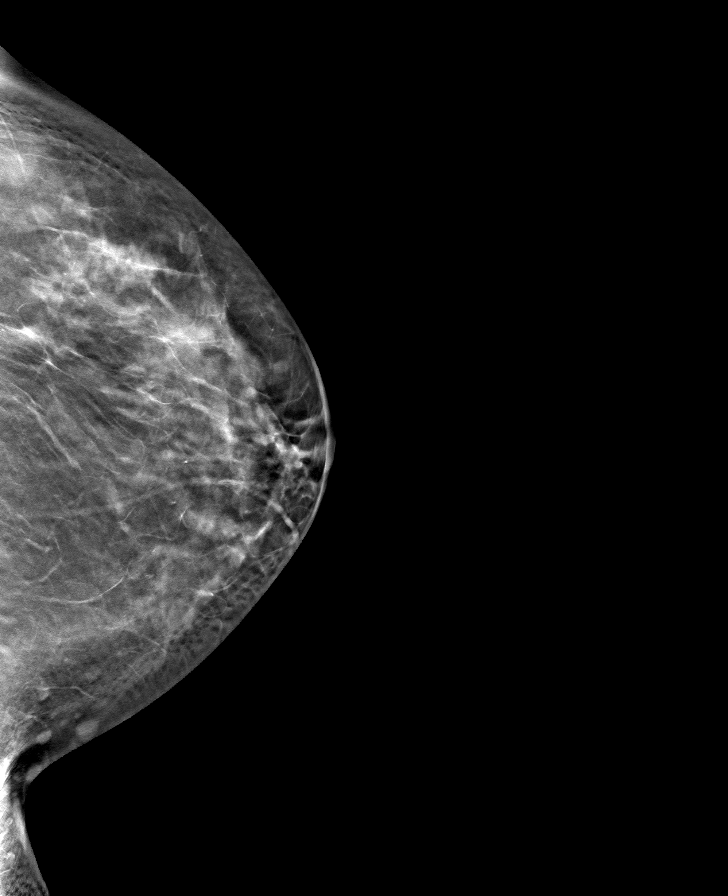

[R MLO tomo · tomo slice 42/83.0]
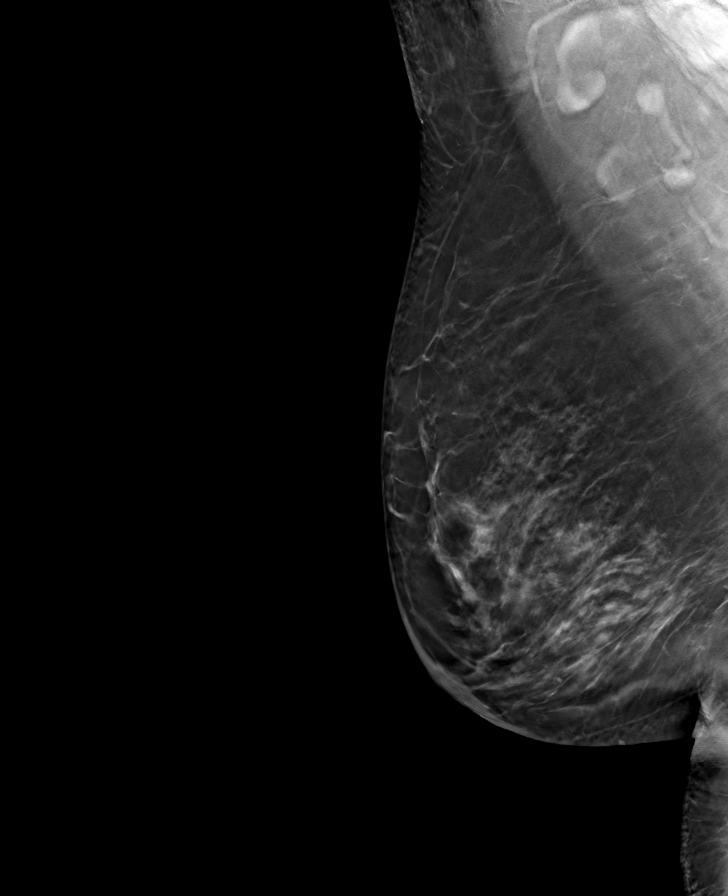

[8 of 24 positions shown; findings below may reference images not displayed]

ACR Breast Density Category c: The breast tissue is heterogeneously
dense, which may obscure small masses.
FINDINGS: There are no findings suspicious for malignancy. Images were
processed with CAD.
IMPRESSION: No mammographic evidence of malignancy. A result letter of this
screening mammogram will be mailed directly to the patient.

RECOMMENDATION:
Screening mammogram in one year. (Code:FT-U-LHB)

BI-RADS CATEGORY  1: Negative.

## 2020-07-06 ENCOUNTER — Other Ambulatory Visit (INDEPENDENT_AMBULATORY_CARE_PROVIDER_SITE_OTHER): Payer: Self-pay | Admitting: Family Medicine

## 2020-07-06 ENCOUNTER — Encounter (INDEPENDENT_AMBULATORY_CARE_PROVIDER_SITE_OTHER): Payer: Self-pay | Admitting: Family Medicine

## 2020-07-06 DIAGNOSIS — I10 Essential (primary) hypertension: Secondary | ICD-10-CM

## 2020-07-07 ENCOUNTER — Other Ambulatory Visit (INDEPENDENT_AMBULATORY_CARE_PROVIDER_SITE_OTHER): Payer: Self-pay | Admitting: Family Medicine

## 2020-07-07 DIAGNOSIS — I10 Essential (primary) hypertension: Secondary | ICD-10-CM

## 2020-07-07 MED ORDER — LISINOPRIL 5 MG PO TABS
5.0000 mg | ORAL_TABLET | Freq: Every day | ORAL | 1 refills | Status: DC
Start: 2020-07-07 — End: 2020-10-08

## 2020-07-09 ENCOUNTER — Encounter (INDEPENDENT_AMBULATORY_CARE_PROVIDER_SITE_OTHER): Payer: Self-pay

## 2020-08-01 ENCOUNTER — Encounter (INDEPENDENT_AMBULATORY_CARE_PROVIDER_SITE_OTHER): Payer: Self-pay | Admitting: Family Medicine

## 2020-08-01 ENCOUNTER — Other Ambulatory Visit (INDEPENDENT_AMBULATORY_CARE_PROVIDER_SITE_OTHER): Payer: Self-pay | Admitting: Family Medicine

## 2020-08-01 DIAGNOSIS — E790 Hyperuricemia without signs of inflammatory arthritis and tophaceous disease: Secondary | ICD-10-CM

## 2020-08-01 MED ORDER — ALLOPURINOL 100 MG PO TABS
200.0000 mg | ORAL_TABLET | Freq: Every day | ORAL | 0 refills | Status: DC
Start: 2020-08-01 — End: 2020-10-08

## 2020-08-23 ENCOUNTER — Other Ambulatory Visit (INDEPENDENT_AMBULATORY_CARE_PROVIDER_SITE_OTHER): Payer: Self-pay | Admitting: Family Medicine

## 2020-09-08 LAB — LIPID PANEL
Cholesterol / HDL Ratio: 4.9 ratio — ABNORMAL HIGH (ref 0.0–4.4)
Cholesterol: 220 mg/dL — ABNORMAL HIGH (ref 100–199)
HDL: 45 mg/dL (ref >39)
LDL Chol Calculated (NIH): 139 mg/dL — ABNORMAL HIGH (ref 0–99)
Triglycerides: 199 mg/dL — ABNORMAL HIGH (ref 0–149)
VLDL Calculated: 36 mg/dL (ref 5–40)

## 2020-09-08 LAB — HEMOGLOBIN A1C: Hemoglobin A1C: 6.2 % — ABNORMAL HIGH (ref 4.8–5.6)

## 2020-09-08 LAB — VITAMIN B12: Vitamin B-12: 1216 pg/mL (ref 232–1245)

## 2020-09-08 LAB — TSH: TSH: 2.53 u[IU]/mL (ref 0.450–4.500)

## 2020-09-08 NOTE — Progress Notes (Signed)
Blood sugar control, lipids, thyroid, and Vitamin B12 level, are all in stable ranges.

## 2020-09-21 ENCOUNTER — Other Ambulatory Visit (INDEPENDENT_AMBULATORY_CARE_PROVIDER_SITE_OTHER): Payer: Self-pay | Admitting: Family Medicine

## 2020-09-21 DIAGNOSIS — E7849 Other hyperlipidemia: Secondary | ICD-10-CM

## 2020-09-21 DIAGNOSIS — E790 Hyperuricemia without signs of inflammatory arthritis and tophaceous disease: Secondary | ICD-10-CM

## 2020-09-21 DIAGNOSIS — J3089 Other allergic rhinitis: Secondary | ICD-10-CM

## 2020-10-03 NOTE — Progress Notes (Signed)
Subjective:      Patient ID: Victoria Fields is a 70 y.o. female     Chief Complaint   Patient presents with    Follow-up        She is seen for ch care recheck.  Was here last in 12/2019.  -Divides her time helping caring for grandchildren in [place], [place], and [place]. The daughter in [place] had a difficult labor for her first baby 2019-11-30. She is there for extended periods of time to help.   -Hurt left shoulder while in [place] from carrying the baby.  X-ray likely rotator cuff tendinopathy 12/2019. Saw Sports Med and had inj. Had PT. Getting better.  -Does a lot of walking in [place].  Notes left lateral knee and hip pain intermittently.  Not able to engage in PT now due to being in [place].    HM  Smoking: in college.  Alcohol: not much.  Diet: balanced  Exercise: regualrly  PAP with HPV was done in 02/2017.  Mammo 09/2019.  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 Yvonna Alanis.  Immuniz: UTD  FH of colon, breast, ovary, skin CA: no.  FH of DM, heart d: brother passed away with MI in 30-Nov-2015. He had PKD and was on dialysis. Daughter with Alport synd.    For ch care:  1)Periph neuropathy  Noted bleeding on her foot that she did not realize that she had decreased sensation of her feet.  She had a Neurological eval per Dr. Sue Lush in the past.  Saw Dr. Jovita Gamma 04/2020. Had NCS 01/2020 showed mildly progressed sensory and motor neuropathy bilat LE's.  Labs 01/2020: low B12 at 281, now on suppl.  Homocysteine 16 and mildly elevated.  SPEP showing hypogammaglobulinemia. ANA neg.  B12 now 1216 in 08/2020.  On Lyrica 25 bid, and notes improvement.  Gabapentin caused excessive lethargy.    2)CKD 3, renal cyst  BUN/Creat 28/1.46 in 05/2016. eGFR 38, 36/1.73 in November 29, 2016.  27/1.39 in 12/2016.  21/1.77 in 09/2017, eGFR 30, K 5.6. Was instructed to cut Lisinopril to 20 mg.  24/1.65 in Nov 29, 2017, eGFR 32, K 5.1.  18/1.49 in 03/2018, eGFR 37, K 5.1.  22/1.38 in 09/2019,  26/1.5/36 in 02/2020,  24/1.61/33 in 08/2020  Abd/pelvic CT 2012 left renal cyst 5 cm.  Renal sono  03/2017, exophytic cyst x 2 largest at 8 cm.  24 hr urine pro 288, cr cl 49 in 03/2018.   Saw Dr. Tereso Newcomer 08/2020, note and BW result reviewed. Na Bicarb had been added, since her CO2 was 17.  CBC nl in 08/2020.  Urine microalbumin ratio 1115 in 04/2019, 1459 in 09/2019, 716 in 02/2020, 429 in 08/2020.    2)Hyperuricemia, kidney stone  On Allopurinol 200 (Uloric not covered by ins).  Uric acid: 4.7 in 12/2017, 3.9 in 03/2018.    3)Hypertension:  On Lisinopril 5 mg (reduced gradually from 20 mg), Cartia XT 120 held by Dr. Tereso Newcomer. Off Amlodipine.  EST in 02/2015 nl.   Echo in 01/2015 showed mild LVH and tr tricuspid regurg and mild aortic valve sclerosis without stenosis  Home BP:    4)Hyperlipidemia, fatty liver  On Zetia 10, Simvastatin 60, Lovaza 2 gm.  Change to Crestor 10 caused joint pain.  Lipids: 220/199/45/139 in 08/2020, 232/124/61/149 in 02/2020, 222/202/46/140 in 09/2019, 185/148/54/101 in 2017-11-29, 214/212/47/125 in November 29, 2016.  CMP with ALT 33 in 11/29/17 and 03/2018, 49 in 04/2019, 38 in 09/2019, 20 in 02/2020, 27 in 08/2020.    5)Hypothyroidism:  Currently taking 50 mcg  of brand name Synthroid daily.  TSH in a good range 08/2020.  Sono 12/2017, nodular thyroid without change.    6)IFG  A1C 6.0 in 10/2016, 5.8 in 03/2018, 6.1 in 10/2018, 5.9 in 04/2019, 6.0 in 02/2020, 6.2 in 08/2020.    7)GERD, Barrett's since 2013  Back on Prevacid, off Nexium due to nausea., Pepcid PRN.  EGD 04/2018 Dr. Lilia Pro, no Barretts, but + esophagitis despite Prevacid.  Update EGD 04/2020, Dr. Yvonna Alanis, no Barretts, but [facility].  Symptoms: none as long as she is on med and stays on diet. Na bicarb helping.    8)OSA  mod non-positional OSA per home testing 01/2018. Was referred for titration study, but she can't keep CPAP on.    9)Exercise induced asthma, allergy  Xyzal, Flonase, Flovent PRN    10)Osteopenia  DEXA T score -1.8 fem neck in 09/2019, -1.7 in 05/2017, -1.5 in 2011.  Vit D 41.7 in 10/2018.    11)BMI>30  Weight change: 10 pounds loss, was 213 in 12/2019.         The following sections were reviewed this encounter by the provider:   Tobacco   Allergies   Meds   Problems   Med Hx   Surg Hx   Fam Hx          Review of Systems   Constitutional: Negative for unexpected weight change.   Eyes: Negative for visual disturbance.   Respiratory: Negative for shortness of breath.    Cardiovascular: Negative for chest pain, palpitations and leg swelling.   Gastrointestinal: Negative for abdominal pain, constipation and diarrhea.   Genitourinary: Negative for dysuria and frequency.   Musculoskeletal: Negative for arthralgias and myalgias.          BP 114/62 (BP Site: Left arm, Patient Position: Sitting, Cuff Size: Medium)    Pulse 84    Temp 97.4 F (36.3 C)    Resp 16    Ht 1.765 m (5' 9.5")    Wt 94.3 kg (208 lb)    BMI 30.28 kg/m     Objective:     Physical Exam  Constitutional:       General: She is not in acute distress.     Appearance: Normal appearance. She is not ill-appearing.   HENT:      Head: Normocephalic and atraumatic.      Nose: Nose normal.   Eyes:      Extraocular Movements: Extraocular movements intact.      Conjunctiva/sclera: Conjunctivae normal.   Pulmonary:      Effort: Pulmonary effort is normal. No respiratory distress.   Musculoskeletal:         General: No swelling or tenderness.      Right lower leg: No edema.      Left lower leg: No edema.      Comments: Left shoulder decreased ROM. Has pain with LUE rotation to the back.  Sore with resisted LUE abduction   Skin:     Comments: Left lateral thigh, 1/2 cm dry erythematous lesion with warty appearance.   Neurological:      General: No focal deficit present.      Mental Status: She is alert.      Gait: Gait normal.   Psychiatric:         Mood and Affect: Mood normal.         Behavior: Behavior normal.          Assessment:     1. Iliotibial band syndrome  of left side    2. Benign essential hypertension  - lisinopril (ZESTRIL) 5 MG tablet; Take 1 tablet (5 mg total) by mouth daily  Dispense: 90 tablet;  Refill: 1    3. Other hyperlipidemia  - simvastatin (ZOCOR) 40 MG tablet; TAKE 1 AND 1/2 TABLETS IN  THE EVENING  Dispense: 135 tablet; Refill: 0  - ezetimibe (ZETIA) 10 MG tablet; Take 1 tablet (10 mg total) by mouth daily  Dispense: 90 tablet; Refill: 0  - omega-3 acid ethyl esters (LOVAZA) 1 g capsule; Take 1 capsule (1 g total) by mouth 2 (two) times daily  Dispense: 180 capsule; Refill: 3  - Lipid panel    4. 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 Stage 3a chronic kidney disease    6. Impaired fasting glucose  - Hemoglobin A1C    7. Polyneuropathy associated with underlying disease    8. Vitamin B12 deficiency    9. Mild intermittent reactive airway disease without complication    10. Hyperuricemia  - Uric acid  - allopurinol (ZYLOPRIM) 100 MG tablet; Take 2 tablets (200 mg total) by mouth daily  Dispense: 180 tablet; Refill: 0    11. Other specified hypothyroidism  - Synthroid 50 MCG tablet; Take 1 tablet (50 mcg total) by mouth Once a day at 6:00am  Dispense: 90 tablet; Refill: 3    12. Encounter for screening mammogram for malignant neoplasm of breast  - Mammo Digital Screening Bilateral W Cad; Future    13. Encounter for hepatitis C screening test for low risk patient  - Hepatitis C (HCV) antibody, Total    14. Encounter for vaccination  - Tdap vaccine greater than or equal to 7yo IM        Plan:     Please continue working on quality low fat low sugar diet as well as regular exercises. Will provide a hand-out for leg exercise.    Continue reduced dose of Lisinopril at 5 mg daily and monitor BP at home.  Desirable range of BP is 110/70-135/85  Please call if consistently out of range.    Please have this blood test done after 8 hours fasting May prior to your next visit.  You may drink water and take your medications.    Continue Vitamin B12 oral supplement daily.    Review of outside records and visit today took place over 45 min.    Addi Pak Paschal Dopp, MD

## 2020-10-05 ENCOUNTER — Other Ambulatory Visit: Payer: Self-pay | Admitting: Family Medicine

## 2020-10-05 DIAGNOSIS — Z1231 Encounter for screening mammogram for malignant neoplasm of breast: Secondary | ICD-10-CM

## 2020-10-08 ENCOUNTER — Encounter (INDEPENDENT_AMBULATORY_CARE_PROVIDER_SITE_OTHER): Payer: Self-pay | Admitting: Family Medicine

## 2020-10-08 ENCOUNTER — Ambulatory Visit (INDEPENDENT_AMBULATORY_CARE_PROVIDER_SITE_OTHER): Payer: BLUE CROSS/BLUE SHIELD | Admitting: Family Medicine

## 2020-10-08 VITALS — BP 114/62 | HR 84 | Temp 97.4°F | Resp 16 | Ht 69.5 in | Wt 208.0 lb

## 2020-10-08 DIAGNOSIS — I10 Essential (primary) hypertension: Secondary | ICD-10-CM

## 2020-10-08 DIAGNOSIS — Z1159 Encounter for screening for other viral diseases: Secondary | ICD-10-CM

## 2020-10-08 DIAGNOSIS — Z23 Encounter for immunization: Secondary | ICD-10-CM

## 2020-10-08 DIAGNOSIS — G63 Polyneuropathy in diseases classified elsewhere: Secondary | ICD-10-CM

## 2020-10-08 DIAGNOSIS — E038 Other specified hypothyroidism: Secondary | ICD-10-CM

## 2020-10-08 DIAGNOSIS — E538 Deficiency of other specified B group vitamins: Secondary | ICD-10-CM

## 2020-10-08 DIAGNOSIS — Z1231 Encounter for screening mammogram for malignant neoplasm of breast: Secondary | ICD-10-CM

## 2020-10-08 DIAGNOSIS — M7632 Iliotibial band syndrome, left leg: Secondary | ICD-10-CM

## 2020-10-08 DIAGNOSIS — R7301 Impaired fasting glucose: Secondary | ICD-10-CM

## 2020-10-08 DIAGNOSIS — E7849 Other hyperlipidemia: Secondary | ICD-10-CM

## 2020-10-08 DIAGNOSIS — Q8781 Alport syndrome: Secondary | ICD-10-CM

## 2020-10-08 DIAGNOSIS — E790 Hyperuricemia without signs of inflammatory arthritis and tophaceous disease: Secondary | ICD-10-CM

## 2020-10-08 DIAGNOSIS — N1831 Chronic kidney disease, stage 3a: Secondary | ICD-10-CM

## 2020-10-08 DIAGNOSIS — J4520 Mild intermittent asthma, uncomplicated: Secondary | ICD-10-CM

## 2020-10-08 MED ORDER — EZETIMIBE 10 MG PO TABS
10.0000 mg | ORAL_TABLET | Freq: Every day | ORAL | 0 refills | Status: DC
Start: 2020-10-08 — End: 2021-01-23

## 2020-10-08 MED ORDER — LISINOPRIL 5 MG PO TABS
5.0000 mg | ORAL_TABLET | Freq: Every day | ORAL | 1 refills | Status: DC
Start: 2020-10-08 — End: 2021-06-01

## 2020-10-08 MED ORDER — SIMVASTATIN 40 MG PO TABS
ORAL_TABLET | ORAL | 0 refills | Status: DC
Start: 2020-10-08 — End: 2021-01-23

## 2020-10-08 MED ORDER — SYNTHROID 50 MCG PO TABS
50.0000 ug | ORAL_TABLET | Freq: Every day | ORAL | 3 refills | Status: DC
Start: 2020-10-08 — End: 2021-06-01

## 2020-10-08 MED ORDER — OMEGA-3-ACID ETHYL ESTERS 1 G PO CAPS
1.0000 | ORAL_CAPSULE | Freq: Two times a day (BID) | ORAL | 3 refills | Status: DC
Start: 2020-10-08 — End: 2021-11-23

## 2020-10-08 MED ORDER — ALLOPURINOL 100 MG PO TABS
200.0000 mg | ORAL_TABLET | Freq: Every day | ORAL | 0 refills | Status: DC
Start: 2020-10-08 — End: 2021-01-23

## 2020-10-08 NOTE — Patient Instructions (Signed)
Please continue working on quality low fat low sugar diet as well as regular exercises. Will provide a hand-out for leg exercise.    Continue reduced dose of Lisinopril at 5 mg daily and monitor BP at home.  Desirable range of BP is 110/70-135/85  Please call if consistently out of range.    Please have this blood test done after 8 hours fasting May prior to your next visit.  You may drink water and take your medications.    Continue Vitamin B12 oral supplement daily.

## 2020-10-21 ENCOUNTER — Other Ambulatory Visit (INDEPENDENT_AMBULATORY_CARE_PROVIDER_SITE_OTHER): Payer: Self-pay | Admitting: Family Medicine

## 2020-10-23 NOTE — Progress Notes (Signed)
This mammogram showed no sign of breast cancer.

## 2021-01-11 ENCOUNTER — Ambulatory Visit (INDEPENDENT_AMBULATORY_CARE_PROVIDER_SITE_OTHER): Payer: BLUE CROSS/BLUE SHIELD | Admitting: Family Medicine

## 2021-01-11 ENCOUNTER — Encounter (INDEPENDENT_AMBULATORY_CARE_PROVIDER_SITE_OTHER): Payer: Self-pay

## 2021-01-11 VITALS — BP 138/84 | HR 72 | Temp 98.3°F | Resp 16 | Ht 70.0 in | Wt 200.0 lb

## 2021-01-11 DIAGNOSIS — M533 Sacrococcygeal disorders, not elsewhere classified: Secondary | ICD-10-CM

## 2021-01-11 MED ORDER — CELECOXIB 100 MG PO CAPS
100.0000 mg | ORAL_CAPSULE | Freq: Two times a day (BID) | ORAL | 0 refills | Status: DC
Start: 2021-01-11 — End: 2021-02-16

## 2021-01-11 NOTE — Progress Notes (Signed)
[facility] [place] [facility] [place] [facility]                       Date of Exam: 01/11/2021 12:21 PM        Patient ID: Victoria Fields is a 70 y.o. female.  Attending Physician: Delice Lesch, MD        Chief Complaint:    Chief Complaint   Patient presents with   . Pain     Broke her tailbone 35 years ago - FYI  started hurting about 4 weeks ago after a 6 hour flight                HPI:    70 year old F with HTN, HLD, OSA, hypothyroidism presents for tailbone pain.  When younger daughter was born, fractured tailbone.  This started a month ago after long airplane flight, very uncomfortable seat.   After that when sat back down felt something had happened.  Has not improved at all.  Has tried heat, donut cushion, Aleve 225 mg at night, has been taking nightly.  That is bothering her stomach.     Has hx mild CKD.   No change in bowel/bladder habits.           Problem List:    Patient Active Problem List   Diagnosis   . Allergic rhinitis   . Barrett's esophagus   . Alport syndrome-like hereditary nephritis   . Benign essential hypertension   . Benign neoplasm of thyroid gland   . Stage 3a chronic kidney disease   . Hyperlipidemia   . Hyperuricemia   . Hypothyroidism   . Impaired fasting glucose   . Kidney stone   . Obesity with body mass index 30 or greater   . Obstructive sleep apnea syndrome   . Osteopenia   . Reactive airway disease   . Mitral valve regurgitation   . Urine test positive for microalbuminuria   . Screening for malignant neoplasm of breast   . Fatty liver   . Vitamin B12 deficiency   . Polyneuropathy associated with underlying disease             Current Meds:    Outpatient Medications Marked as Taking for the 01/11/21 encounter (Office Visit) with Delice Lesch, MD   Medication Sig Dispense Refill   . albuterol sulfate HFA (Ventolin HFA) 108 (90 Base) MCG/ACT inhaler Inhale 2 puffs into the lungs every 4 (four) hours as needed for Wheezing or Shortness of Breath 1 Inhaler  3   . allopurinol (ZYLOPRIM) 100 MG tablet Take 2 tablets (200 mg total) by mouth daily 180 tablet 0   . Calcium Citrate-Vitamin D (CALCIUM + D PO) Take by mouth     . Cholecalciferol (Vitamin D) 50 MCG (2000 UT) Cap 2 (two) times daily        . ezetimibe (ZETIA) 10 MG tablet Take 1 tablet (10 mg total) by mouth daily 90 tablet 0   . fluticasone (FLONASE) 50 MCG/ACT nasal spray SHAKE WELL BEFORE EACH USE AND USE 2 SPRAYS IN EACH   NOSTRIL ONCE DAILY 48 g 3   . fluticasone (Flovent HFA) 44 MCG/ACT inhaler USE 2 INHALATIONS ORALLY   EVERY 12 HOURS 31.8 g 3   . lansoprazole (PREVACID) 30 MG capsule Take 30 mg by mouth daily.     Marland Kitchen levocetirizine (XYZAL) 5 MG tablet TAKE 1 TABLET EVERY EVENING 90 tablet 0   . lisinopril (  ZESTRIL) 5 MG tablet Take 1 tablet (5 mg total) by mouth daily 90 tablet 1   . omega-3 acid ethyl esters (LOVAZA) 1 g capsule Take 1 capsule (1 g total) by mouth 2 (two) times daily 180 capsule 3   . pregabalin (LYRICA) 25 MG capsule      . simvastatin (ZOCOR) 40 MG tablet TAKE 1 AND 1/2 TABLETS IN  THE EVENING 135 tablet 0   . sodium bicarbonate 650 MG tablet Take 1 tablet by mouth 2 (two) times daily     . Synthroid 50 MCG tablet Take 1 tablet (50 mcg total) by mouth Once a day at 6:00am 90 tablet 3          Allergies:    Allergies   Allergen Reactions   . Cephalosporins Anaphylaxis   . Strawberry Extract Hives   . Cefuroxime    . Dust Mite Extract    . Molds & Smuts    . Niacin Itching   . Pollen Extract    . Tree Extract              Past Surgical History:    Past Surgical History:   Procedure Laterality Date   . CATARACT EXTRACTION  11/16/2008   . COLONOSCOPY  02/17/2015   . COLONOSCOPY  08/08/2007   . DILATION AND CURETTAGE OF UTERUS     . EGD, COLONOSCOPY N/A 04/21/2020    Procedure: EGD, COLONOSCOPY;  Surgeon: Konrad Saha, MD;  Location: [facility];  Service: Gastroenterology;  Laterality: N/A;  EGD, COLONOSCOPY  Asst=N   . EYE SURGERY Bilateral     cataract   . TONSILECTOMY,  ADENOIDECTOMY, BILATERAL MYRINGOTOMY AND TUBES     . UPPER GASTROINTESTINAL ENDOSCOPY  04/24/2018           Family History:    Family History   Problem Relation Age of Onset   . Heart failure Mother    . Osteoporosis Mother    . Thyroid disease Mother    . Hyperlipidemia Mother    . Heart failure Father    . Myocardial Infarction Father    . Coronary artery disease Father    . Stent Father    . Hyperlipidemia Father    . Myocardial Infarction Maternal Grandfather    . Myocardial Infarction Paternal Grandfather 60   . Thyroid disease Sister    . Hyperlipidemia Sister    . Hyperlipidemia Brother    . Sudden death Brother         cardiac   . Kidney disease Brother    . Asthma Brother    . Thyroid disease Daughter    . Hyperlipidemia Daughter            Social History:    Social History     Tobacco Use   . Smoking status: Never Smoker   . Smokeless tobacco: Never Used   Vaping Use   . Vaping Use: Never used   Substance Use Topics   . Alcohol use: Yes     Comment: rare   . Drug use: Never           The following sections were reviewed this encounter by the provider:   Tobacco  Allergies  Meds  Problems  Med Hx  Surg Hx  Fam Hx               Vital Signs:    BP 138/84   Pulse 72   Temp 98.3 F (  36.8 C) (Tympanic)   Resp 16   Ht 1.778 m (5\' 10" )   Wt 90.7 kg (200 lb)   SpO2 98%   BMI 28.70 kg/m          ROS:    Review of Systems   ROS per HPI        Physical Exam:    Physical Exam  Vitals reviewed.   Constitutional:       General: She is not in acute distress.     Appearance: Normal appearance. She is not ill-appearing.   Pulmonary:      Effort: Pulmonary effort is normal.   Neurological:      Mental Status: She is alert.     Spine:  ROM low back normal.    L/S spine grossly normal.    No bony tenderness spine.  + Tender at tip coccyx.          Assessment:    1. Nontraumatic coccydynia  - Ambulatory referral to Physical Therapy  - celecoxib (CeleBREX) 100 MG capsule; Take 1 capsule (100 mg total) by mouth 2  (two) times daily  Dispense: 60 capsule; Refill: 0              Plan:    Patient instructions provided as noted below.  Return precautions discussed.     Patient Instructions   Today we evaluated you for your tail bone pain (AKA coccydynia).  Given that it has already been bothering you about a month, we will try to get you some PT to work on this.   I also prescribed celebrex, which is an anti-inflammatory medication to take to decrease the discomfort and inflammation.  You can start some exercises to work on this before you see PT.  This Youtube video is quite good at explaining coccydynia and gives some stretches to work on.  [email or web address]    Follow up with sports medicine if not doing better when you return from [place].              Follow-up:    Follow up if not improving, worsening symptoms, or new concerning symptoms develop.  No follow-ups on file.         Delice Lesch, MD

## 2021-01-11 NOTE — Patient Instructions (Signed)
Today we evaluated you for your tail bone pain (AKA coccydynia).  Given that it has already been bothering you about a month, we will try to get you some PT to work on this.   I also prescribed celebrex, which is an anti-inflammatory medication to take to decrease the discomfort and inflammation.  You can start some exercises to work on this before you see PT.  This Youtube video is quite good at explaining coccydynia and gives some stretches to work on.  [email or web address]    Follow up with sports medicine if not doing better when you return from [place].

## 2021-01-23 ENCOUNTER — Other Ambulatory Visit (INDEPENDENT_AMBULATORY_CARE_PROVIDER_SITE_OTHER): Payer: Self-pay | Admitting: Family Medicine

## 2021-01-23 DIAGNOSIS — E790 Hyperuricemia without signs of inflammatory arthritis and tophaceous disease: Secondary | ICD-10-CM

## 2021-01-23 DIAGNOSIS — E7849 Other hyperlipidemia: Secondary | ICD-10-CM

## 2021-01-23 DIAGNOSIS — J3089 Other allergic rhinitis: Secondary | ICD-10-CM

## 2021-01-23 DIAGNOSIS — J4520 Mild intermittent asthma, uncomplicated: Secondary | ICD-10-CM

## 2021-02-08 LAB — HEMOGLOBIN A1C: Hemoglobin A1C: 6.1 % — ABNORMAL HIGH (ref 4.8–5.6)

## 2021-02-08 LAB — URIC ACID: Uric acid: 5.1 mg/dL (ref 3.0–7.2)

## 2021-02-08 LAB — HEPATITIS C ANTIBODY: HCV AB: <0.1 s/co ratio (ref 0.0–0.9)

## 2021-02-08 LAB — LIPID PANEL
Cholesterol / HDL Ratio: 4.7 ratio — ABNORMAL HIGH (ref 0.0–4.4)
Cholesterol: 222 mg/dL — ABNORMAL HIGH (ref 100–199)
HDL: 47 mg/dL (ref >39)
LDL Chol Calculated (NIH): 142 mg/dL — ABNORMAL HIGH (ref 0–99)
Triglycerides: 186 mg/dL — ABNORMAL HIGH (ref 0–149)
VLDL Calculated: 33 mg/dL (ref 5–40)

## 2021-02-08 NOTE — Progress Notes (Signed)
Uric acid remains in a good range.  Pre-Diabetes state remains at about the same level.  Lipids remain at the same level with still mildly elevated LDL cholesterol.  Hepatitis C screen was negative.

## 2021-02-11 NOTE — Progress Notes (Signed)
Subjective:      Patient ID: Victoria Fields is a 70 y.o. female     Chief Complaint   Patient presents with   . Follow-up        She is seen for ch care recheck.  Was seen by me last in 09/2020.    In the interim, was seen for tailbone pain. Got through PT and improved.    -Divides her time helping caring for grandchildren in [place], [place], and [place]. The daughter in [place] had a difficult labor for her first baby 11-12-2019. She is there for extended periods of time to help.   -Hurt left shoulder while in [place] from carrying the baby.  X-ray likely rotator cuff tendinopathy 12/2019. Saw Sports Med and had inj. Had PT. Getting better.  -Does a lot of walking in [place].  Notes left lateral knee and hip pain intermittently.  Not able to engage in PT now due to being in [place].  Has apptmt with Dr. Trisha Mangle tomorrow.    HM  Smoking: in college.  Alcohol: not much.  Diet: balanced  Exercise: regualrly  PAP with HPV was done in 02/2017.  Mammo 11/11/20, [facility].  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 Yvonna Alanis.  HCV neg.  Immuniz: UTD  FH of colon, breast, ovary, skin CA: no.  FH of DM, heart d: brother passed away with MI in 11-12-2015. He had PKD and was on dialysis. Daughter with Alport synd.    For ch care:  1)Periph neuropathy  Noted bleeding on her foot that she did not realize that she had decreased sensation of her feet.  She had a Neurological eval per Dr. Sue Lush in the past.  Saw Dr. Jovita Gamma 04/2020. Had NCS 01/2020 showed mildly progressed sensory and motor neuropathy bilat LE's.  Labs 01/2020: low B12 at 281, now on suppl.  Homocysteine 16 and mildly elevated.  SPEP showing hypogammaglobulinemia. ANA neg.  B12 now 1216 in 08/2020.  On Lyrica 25 bid, and notes improvement.  Gabapentin caused excessive lethargy.  Notes sensation on the bottom of feet like standing on rocks.    2)CKD 3, renal cyst, Alports synd  BUN/Creat 28/1.46 in 05/2016. eGFR 38, 36/1.73 in 11-Nov-2016.  27/1.39 in 12/2016.  21/1.77 in 09/2017, eGFR 30, K 5.6. Was instructed to  cut Lisinopril to 20 mg.  24/1.65 in 11/11/2017, eGFR 32, K 5.1.  18/1.49 in 03/2018, eGFR 37, K 5.1.  22/1.38 in 09/2019,  26/1.5/36 in 02/2020,  24/1.61/33 in 08/2020  Abd/pelvic CT 2012 left renal cyst 5 cm.  Renal sono 03/2017, exophytic cyst x 2 largest at 8 cm.  24 hr urine pro 288, cr cl 49 in 03/2018.   Saw Dr. Tereso Newcomer 08/2020, note and BW result reviewed. Na Bicarb had been added, since her CO2 was 17.  CBC nl in 08/2020.  Urine microalbumin ratio 1115 in 04/2019, 1459 in 09/2019, 716 in 02/2020, 429 in 08/2020.    2)Hyperuricemia, kidney stone  On Allopurinol 200 (Uloric not covered by ins).  Uric acid: 4.7 in 12/2017, 3.9 in 03/2018, 5.1 now.    3)Hypertension:  On Lisinopril 5 mg (reduced gradually from 20 mg). Cartia XT 120 held by Dr. Tereso Newcomer. Off Amlodipine.  EST in 02/2015 nl.   Echo in 01/2015 showed mild LVH and tr tricuspid regurg and mild aortic valve sclerosis without stenosis  Home BP: jn good ranges.    4)Hyperlipidemia, fatty liver  On Zetia 10, Simvastatin 60, Lovaza 2 gm.  Change to Crestor 10  caused joint pain.  Lipids: 222/186/47/142 now, 220/199/45/139 in 08/2020, 232/124/61/149 in 02/2020, 185/148/54/101 in 10/2017, 214/212/47/125 in 10/2016.  CMP with ALT 33 in 10/2017 and 03/2018, 49 in 04/2019, 38 in 09/2019, 20 in 02/2020, 27 in 08/2020.    5)Hypothyroidism:  Currently taking 50 mcg of brand name Synthroid daily.  TSH in a good range 08/2020.  Sono 12/2017, nodular thyroid without change.    6)IFG  A1C 6.0 in 10/2016, 5.8 in 03/2018, 6.2 in 08/2020, 6.1 now.    7)GERD, Barrett's since 2013  Back on Prevacid, off Nexium due to nausea., Pepcid PRN.  EGD 04/2018 Dr. Lilia Pro, no Barretts, but + esophagitis despite Prevacid.  Update EGD 04/2020, Dr. Yvonna Alanis, no Barretts, but [facility].  GI visit 06/2020.  Symptoms: none as long as she is on med and stays on diet. Na bicarb helping.    8)OSA  mod non-positional OSA per home testing 01/2018. Was referred for titration study, but she can't keep CPAP on.    9)Exercise induced  asthma, allergy  Xyzal, Flonase, Flovent PRN. Recent mild flare with daily use inhaler.     10)Osteopenia  DEXA T score -1.8 fem neck in 09/2019, -1.7 in 05/2017, -1.5 in 2011.  Vit D 41.7 in 10/2018.    11)BMI>30  Weight change: 10 pounds loss, was 213 in 12/2019.        The following sections were reviewed this encounter by the provider:   Tobacco  Allergies  Meds  Problems  Med Hx  Surg Hx  Fam Hx           Review of Systems   Constitutional: Negative for unexpected weight change.   Eyes: Negative for visual disturbance.   Respiratory: Negative for shortness of breath.    Cardiovascular: Negative for chest pain, palpitations and leg swelling.   Gastrointestinal: Negative for abdominal pain, constipation and diarrhea.   Genitourinary: Negative for dysuria and frequency.   Musculoskeletal: Positive for arthralgias. Negative for myalgias.          BP 122/60   Pulse 76   Resp 18   Wt 92.9 kg (204 lb 12.8 oz)   BMI 29.39 kg/m     Objective:     Physical Exam  Constitutional:       General: She is not in acute distress.     Appearance: Normal appearance. She is not ill-appearing.   HENT:      Head: Normocephalic and atraumatic.      Nose: Nose normal.   Eyes:      Extraocular Movements: Extraocular movements intact.      Conjunctiva/sclera: Conjunctivae normal.   Pulmonary:      Effort: Pulmonary effort is normal. No respiratory distress.   Musculoskeletal:         General: No swelling or tenderness.      Right lower leg: No edema.      Left lower leg: No edema.   Skin:     Comments: .   Neurological:      General: No focal deficit present.      Mental Status: She is alert.      Gait: Gait normal.   Psychiatric:         Mood and Affect: Mood normal.         Behavior: Behavior normal.          Assessment:     1. Acute pain of left knee  - XR Knee Left 4+ Views  2. Polyneuropathy associated with underlying disease  - Ambulatory referral to Podiatry    3. Non-seasonal allergic rhinitis due to other allergic  trigger    4. Mild intermittent reactive airway disease without complication  - montelukast (SINGULAIR) 10 MG tablet; Take 1 tablet (10 mg total) by mouth nightly  Dispense: 30 tablet; Refill: 1    5. Other hyperlipidemia  - rosuvastatin (CRESTOR) 10 MG tablet; Take 1 tablet (10 mg total) by mouth daily  Dispense: 30 tablet; Refill: 1    6. Benign essential hypertension    7. Alport syndrome-like hereditary nephritis    8. Stage 3a chronic kidney disease    9. Impaired fasting glucose    10. Hyperuricemia    11. Other specified hypothyroidism    12. Osteopenia of multiple sites        Plan:     Please continue working on quality low fat low sugar diet as well as regular exercises. Will provide a hand-out for leg exercise.    Continue Lisinopril at 5 mg daily and monitor BP at home.  Desirable range of BP is 110/70-135/85  Please call if consistently out of range.    Change Simvastatin to Rosuvastatin, to see if you can tolerate it.  If feeling ok, raise dose, then recheck fasting blood test.    Trial of Singlulair to add to asthma management.    X-ray today, so Dr. Trisha Mangle has it to look at.    Podiatry consultation to consider treatment for plantar fasciitis.    Review of outside records and visit today took place over 45 min.    Jimmi Sidener Paschal Dopp, MD

## 2021-02-16 ENCOUNTER — Encounter (INDEPENDENT_AMBULATORY_CARE_PROVIDER_SITE_OTHER): Payer: Self-pay | Admitting: Family Medicine

## 2021-02-16 ENCOUNTER — Ambulatory Visit (INDEPENDENT_AMBULATORY_CARE_PROVIDER_SITE_OTHER): Payer: BLUE CROSS/BLUE SHIELD | Admitting: Family Medicine

## 2021-02-16 VITALS — BP 122/60 | HR 76 | Resp 18 | Wt 204.8 lb

## 2021-02-16 DIAGNOSIS — M8589 Other specified disorders of bone density and structure, multiple sites: Secondary | ICD-10-CM

## 2021-02-16 DIAGNOSIS — G63 Polyneuropathy in diseases classified elsewhere: Secondary | ICD-10-CM

## 2021-02-16 DIAGNOSIS — E038 Other specified hypothyroidism: Secondary | ICD-10-CM

## 2021-02-16 DIAGNOSIS — J4520 Mild intermittent asthma, uncomplicated: Secondary | ICD-10-CM

## 2021-02-16 DIAGNOSIS — N1831 Chronic kidney disease, stage 3a: Secondary | ICD-10-CM

## 2021-02-16 DIAGNOSIS — M25562 Pain in left knee: Secondary | ICD-10-CM

## 2021-02-16 DIAGNOSIS — R7301 Impaired fasting glucose: Secondary | ICD-10-CM

## 2021-02-16 DIAGNOSIS — E7849 Other hyperlipidemia: Secondary | ICD-10-CM

## 2021-02-16 DIAGNOSIS — E790 Hyperuricemia without signs of inflammatory arthritis and tophaceous disease: Secondary | ICD-10-CM

## 2021-02-16 DIAGNOSIS — I10 Essential (primary) hypertension: Secondary | ICD-10-CM

## 2021-02-16 DIAGNOSIS — J3089 Other allergic rhinitis: Secondary | ICD-10-CM

## 2021-02-16 DIAGNOSIS — Q8781 Alport syndrome: Secondary | ICD-10-CM

## 2021-02-16 MED ORDER — MONTELUKAST SODIUM 10 MG PO TABS
10.0000 mg | ORAL_TABLET | Freq: Every evening | ORAL | 1 refills | Status: DC
Start: 2021-02-16 — End: 2021-04-06

## 2021-02-16 MED ORDER — ROSUVASTATIN CALCIUM 10 MG PO TABS
10.0000 mg | ORAL_TABLET | Freq: Every day | ORAL | 1 refills | Status: DC
Start: 2021-02-16 — End: 2021-03-03

## 2021-02-16 NOTE — Progress Notes (Signed)
No significant degenerative change was seen.  Please review with Dr. Trisha Mangle.

## 2021-02-16 NOTE — Patient Instructions (Signed)
Please continue working on quality low fat low sugar diet as well as regular exercises. Will provide a hand-out for leg exercise.    Continue Lisinopril at 5 mg daily and monitor BP at home.  Desirable range of BP is 110/70-135/85  Please call if consistently out of range.    Change Simvastatin to Rosuvastatin, to see if you can tolerate it.  If feeling ok, raise dose, then recheck fasting blood test.    Trial of Singlulair to add to asthma management.    X-ray today, so Dr. Trisha Mangle has it to look at.    Podiatry consultation to consider treatment for plantar fasciitis.    Review of outside records and visit today took place over 45 min

## 2021-02-17 ENCOUNTER — Encounter (INDEPENDENT_AMBULATORY_CARE_PROVIDER_SITE_OTHER): Payer: Self-pay | Admitting: Family Medicine

## 2021-02-17 ENCOUNTER — Ambulatory Visit (INDEPENDENT_AMBULATORY_CARE_PROVIDER_SITE_OTHER): Payer: BLUE CROSS/BLUE SHIELD | Admitting: Family Medicine

## 2021-02-17 DIAGNOSIS — M533 Sacrococcygeal disorders, not elsewhere classified: Secondary | ICD-10-CM

## 2021-02-17 NOTE — Progress Notes (Signed)
Subjective:      Patient ID: Victoria Fields is a 70 y.o. female     Chief Complaint   Patient presents with    Tailbone Pain        HPI     coccydinia  Acute on chronic problem  Patient was last seen on 01/11/21 by Ratterman  At that time patient was given Celebrex  Endorses some relief with the medication    Patient mentions that she fractured her coccyx 35 years ago during the birth of her daughter  At that time patient was given conservative measure instructions  Instructed to use a doughnut pillow and NSAIDs  Coccyx eventually healed  Patient was asymptomatic up until recently  Patient had a long plane flight for 6 hours  Patient then walked long distances in [place]  Endorses pain with prolonged walking and standing  Pain is most noticeable with sitting  She has noticed some initial improvement in her pain  Patient has also had difficulty with prolonged driving  No loss of bladder or bowel function    Of note the patient has a history of B/l idiopathic neuropathy  Currently on lyrica 25mg  TID  Was on gabapentin in the past which was stopped secondary to oversedation     The following sections were reviewed this encounter by the provider:          Review of Systems   Constitutional: Negative for activity change and appetite change.   HENT: Negative for congestion.    Eyes: Negative for visual disturbance.   Respiratory: Negative for cough and shortness of breath.    Cardiovascular: Negative for chest pain and leg swelling.   Gastrointestinal: Negative for abdominal pain.   Musculoskeletal:        Joint pain   Skin: Negative for color change and rash.   Neurological: Negative for dizziness.   Psychiatric/Behavioral: Negative for dysphoric mood. The patient is not nervous/anxious.           There were no vitals taken for this visit.    Objective:     Physical Exam  Vitals and nursing note reviewed.   Constitutional:       General: She is not in acute distress.     Appearance: Normal appearance.   HENT:       Head: Normocephalic and atraumatic.      Nose: No rhinorrhea.   Eyes:      General: No scleral icterus.        Right eye: No discharge.         Left eye: No discharge.   Cardiovascular:      Rate and Rhythm: Normal rate.   Pulmonary:      Effort: Pulmonary effort is normal.   Musculoskeletal:      Cervical back: Neck supple.      Comments: Pain over her distal coccyx   Skin:     General: Skin is warm and dry.   Neurological:      General: No focal deficit present.      Mental Status: She is alert and oriented to person, place, and time.   Psychiatric:         Mood and Affect: Mood normal.         Behavior: Behavior normal.       Coccyx radiographs  X-rays of sacrum and coccyx reveal proper alignment with some mild degenerative changes over the coccyx.  Personally reviewed images and call patient with interpretation.  Assessment:     1. Coccydynia  - XR Sacrum and Coccyx 2+Views  - Referral to Physical Therapy - EXTERNAL        Plan:     Coccydynia  Acute on chronic problem.  History and exam consistent with coccydynia.  X-rays revealed minor degenerative changes over the coccyx.  Had lengthy discussion with the patient about the likely etiology, progression and treatment options for her pain.  After discussion with the patient it was ultimately decided to continue with conservative management.  May use over-the-counter analgesics as needed.  We will refer patient to pelvic floor physical therapy.  Relative activity guidelines discussed.  If pain persist despite physical therapy, will return to clinic for possible corticosteroid injection.    Brock Ra, MD     Portions of this note may have been generated by the Epic EMR system and Dragon Speech Recognition. Due to software limitations, it may contain errors or omissions not intended by the user such as grammatical errors, word insertions, or sound-alike words. Errors are not always caught or corrected. If there are questions about the content of this note,  please contact the author for clarification.

## 2021-03-02 ENCOUNTER — Encounter (INDEPENDENT_AMBULATORY_CARE_PROVIDER_SITE_OTHER): Payer: Self-pay | Admitting: Family Medicine

## 2021-03-03 ENCOUNTER — Other Ambulatory Visit (INDEPENDENT_AMBULATORY_CARE_PROVIDER_SITE_OTHER): Payer: Self-pay | Admitting: Family Medicine

## 2021-03-03 DIAGNOSIS — E7849 Other hyperlipidemia: Secondary | ICD-10-CM

## 2021-03-03 MED ORDER — ROSUVASTATIN CALCIUM 10 MG PO TABS
20.0000 mg | ORAL_TABLET | Freq: Every day | ORAL | 1 refills | Status: DC
Start: 2021-03-03 — End: 2021-05-05

## 2021-04-06 ENCOUNTER — Other Ambulatory Visit (INDEPENDENT_AMBULATORY_CARE_PROVIDER_SITE_OTHER): Payer: Self-pay | Admitting: Family Medicine

## 2021-04-06 DIAGNOSIS — J4520 Mild intermittent asthma, uncomplicated: Secondary | ICD-10-CM

## 2021-04-06 DIAGNOSIS — E790 Hyperuricemia without signs of inflammatory arthritis and tophaceous disease: Secondary | ICD-10-CM

## 2021-04-06 MED ORDER — MONTELUKAST SODIUM 10 MG PO TABS
10.0000 mg | ORAL_TABLET | Freq: Every evening | ORAL | 1 refills | Status: DC
Start: 2021-04-06 — End: 2021-06-01

## 2021-04-12 ENCOUNTER — Encounter (INDEPENDENT_AMBULATORY_CARE_PROVIDER_SITE_OTHER): Payer: Self-pay | Admitting: Family Medicine

## 2021-04-30 ENCOUNTER — Encounter (INDEPENDENT_AMBULATORY_CARE_PROVIDER_SITE_OTHER): Payer: Self-pay

## 2021-05-05 ENCOUNTER — Other Ambulatory Visit (INDEPENDENT_AMBULATORY_CARE_PROVIDER_SITE_OTHER): Payer: Self-pay | Admitting: Family Medicine

## 2021-05-05 DIAGNOSIS — E7849 Other hyperlipidemia: Secondary | ICD-10-CM

## 2021-05-05 MED ORDER — ROSUVASTATIN CALCIUM 10 MG PO TABS
20.0000 mg | ORAL_TABLET | Freq: Every day | ORAL | 0 refills | Status: DC
Start: 2021-05-05 — End: 2021-05-21

## 2021-05-20 ENCOUNTER — Encounter (INDEPENDENT_AMBULATORY_CARE_PROVIDER_SITE_OTHER): Payer: Self-pay | Admitting: Family Medicine

## 2021-05-21 ENCOUNTER — Other Ambulatory Visit (INDEPENDENT_AMBULATORY_CARE_PROVIDER_SITE_OTHER): Payer: Self-pay | Admitting: Family Medicine

## 2021-05-21 DIAGNOSIS — E7211 Homocystinuria: Secondary | ICD-10-CM

## 2021-05-21 DIAGNOSIS — E7849 Other hyperlipidemia: Secondary | ICD-10-CM

## 2021-05-21 DIAGNOSIS — I10 Essential (primary) hypertension: Secondary | ICD-10-CM

## 2021-05-21 MED ORDER — ROSUVASTATIN CALCIUM 10 MG PO TABS
20.0000 mg | ORAL_TABLET | Freq: Every day | ORAL | 0 refills | Status: DC
Start: 2021-05-21 — End: 2021-06-01

## 2021-05-26 NOTE — Progress Notes (Signed)
Subjective:      Patient ID: Victoria Fields is a 70 y.o. female     Chief Complaint   Patient presents with    chronic care    Migraine        She is seen for ch care recheck.  Was seen by me last in 02/2021.    -Divides her time helping caring for grandchildren in [place], [place], and [place]. The daughter in [place] had a difficult labor for her first baby 12/12/19. She is there for extended periods of time to help.    -Migraine recurring.  Used to have it.  Took Imitrex or Relpax but had severe fatigue from taking them.  Had 3 episodes in the last few months.    HM  Smoking: in college.  Alcohol: not much.  Diet: balanced  Exercise: regualrly  PAP with HPV was done in 02/2017.  Mammo 11-Dec-2020, [facility].  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 Yvonna Alanis.  HCV neg.  Immuniz: UTD  FH of colon, breast, ovary, skin CA: no.  FH of DM, heart d: brother passed away with MI in 2015-12-12. He had PKD and was on dialysis. Daughter with Alport synd.    For ch care:  1)Periph neuropathy  Noted bleeding on her foot that she did not realize that she had decreased sensation of her feet.  She had a Neurological eval per Dr. Sue Lush in the past.  Saw Dr. Jovita Gamma 04/2020. Had NCS 01/2020 showed mildly progressed sensory and motor neuropathy bilat LE's.  Labs 01/2020: low B12 at 281, now on suppl.  Homocysteine 16 and mildly elevated.  SPEP showing hypogammaglobulinemia. ANA neg.  B12 1216 in 08/2020, but homocysteine still at 17.4.  On Lyrica 25 bid, and notes improvement.  Gabapentin caused excessive lethargy.    2)CKD 3, renal cyst, Alports synd  On Na Bicarb.  BUN/Creat 28/1.46 in 05/2016. eGFR 38, 36/1.73 in 2016-12-11.  27/1.39 in 12/2016.  21/1.77 in 09/2017, eGFR 30, K 5.6. Was instructed to cut Lisinopril to 20 mg.  22/1.38 in 09/2019,  24/1.61/33 in 08/2020,  30/1.59/35 in 03/2021.  Abd/pelvic CT 2012 left renal cyst 5 cm.  Renal sono 03/2017, exophytic cyst x 2 largest at 8 cm.  24 hr urine pro 288, cr cl 49 in 03/2018.   Saw Dr. Tereso Newcomer then Dr. Lane Hacker 03/2021,  note and BW result reviewed.   CBC nl in 03/2021.  Urine microalbumin ratio 1115 in 04/2019, 1459 in 09/2019, 716 in 02/2020, 429 in 08/2020.    2)Hyperuricemia, kidney stone  On Allopurinol 200 (Uloric not covered by ins).  Uric acid: 4.7 in 12/2017, 3.9 in 03/2018, 5.1 in 01/2021.    3)Hypertension:  On Lisinopril 5 mg (reduced gradually from 20 mg). Cartia XT 120 held by Dr. Tereso Newcomer. Off Amlodipine.  EST in 02/2015 nl.   Echo in 01/2015 showed mild LVH and tr tricuspid regurg and mild aortic valve sclerosis without stenosis  Home BP: jn good ranges.    4)Hyperlipidemia, fatty liver  On Zetia 10, Crestor 20 since last visit, Lovaza 2 gm. Off Simvastatin 60.  Lipids: 200/199/47/118 now, 222/186/47/142 in 01/2021, 220/199/45/139 in 08/2020, 232/124/61/149 in 02/2020, 185/148/54/101 in 11-Dec-2017, 214/212/47/125 in 12/11/16.    5)Hypothyroidism:  Currently taking 50 mcg of brand name Synthroid daily.  TSH in a good range 08/2020.  Sono 12/2017, nodular thyroid without change.    6)IFG  A1C 6.0 in 2016/12/11, 5.8 in 03/2018, 6.2 in 08/2020, 6.1 in 01/2021.    7)GERD, Barrett's  since 2013  Back on Prevacid, off Nexium due to nausea., Pepcid PRN.  EGD 04/2018 Dr. Lilia Pro, no Barretts, but + esophagitis despite Prevacid.  Update EGD 04/2020, Dr. Yvonna Alanis, no Barretts, but [facility].  GI visit 06/2020.  Symptoms: none as long as she is on med and stays on diet. Na bicarb helping.    8)OSA  mod non-positional OSA per home testing 01/2018. Was referred for titration study, but she can't keep CPAP on.    9)Exercise induced asthma, allergy  Xyzal, Flonase, Flovent PRN. Singulair was added last visit.  She noted some improvement but ran out of med.    10)Osteopenia  DEXA T score -1.8 fem neck in 09/2019, -1.7 in 05/2017, -1.5 in 2011.  Vit D 41.7 in 10/2018.    11) MSK pain  -Left shoulder rotator cuff tendinopathy 12/2019. Saw Sports Med and had inj. Had PT.   -Left lateral knee and hip pain   -Recurring tailbone pain. Was referred to PT  -S/p left foot neuroma  and right 1st MTP fx, saw Dr. Kathlynn Grate 04/2021.    12) BMI>30  Weight change: 10 pounds loss, was 213 in 12/2019.        The following sections were reviewed this encounter by the provider:   Tobacco  Allergies  Meds  Problems  Med Hx  Surg Hx  Fam Hx         Review of Systems   Constitutional:  Negative for unexpected weight change.   Eyes:  Negative for visual disturbance.   Respiratory:  Negative for shortness of breath.    Cardiovascular:  Negative for chest pain, palpitations and leg swelling.   Gastrointestinal:  Negative for abdominal pain, constipation and diarrhea.   Genitourinary:  Negative for dysuria and frequency.   Musculoskeletal:  Positive for arthralgias. Negative for myalgias.        BP 130/74 (BP Site: Left arm, Patient Position: Sitting)   Pulse 80   Temp 98.9 F (37.2 C) (Tympanic)   Resp 18   Ht 1.758 m (5' 9.2")   Wt 94.4 kg (208 lb 3.2 oz)   BMI 30.57 kg/m     Objective:     Physical Exam  Constitutional:       General: She is not in acute distress.     Appearance: Normal appearance. She is not ill-appearing.   HENT:      Head: Normocephalic and atraumatic.      Nose: Nose normal.   Eyes:      Extraocular Movements: Extraocular movements intact.      Conjunctiva/sclera: Conjunctivae normal.   Pulmonary:      Effort: Pulmonary effort is normal. No respiratory distress.   Musculoskeletal:         General: Normal range of motion.   Neurological:      General: No focal deficit present.      Mental Status: She is alert.      Gait: Gait normal.   Psychiatric:         Mood and Affect: Mood normal.         Behavior: Behavior normal.        Assessment:     1. Other migraine without status migrainosus, not intractable  - Rimegepant Sulfate 75 MG Tablet Dispersible; Take 1 tablet (75 mg total) by mouth daily as needed (migraine)  Dispense: 12 tablet; Refill: 1    2. Other hyperlipidemia  - rosuvastatin (CRESTOR) 20 MG tablet; Take 1 tablet (20 mg  total) by mouth daily  Dispense: 90 tablet;  Refill: 3  - ezetimibe (ZETIA) 10 MG tablet; Take 1 tablet (10 mg total) by mouth daily  Dispense: 90 tablet; Refill: 3    3. Benign essential hypertension  - lisinopril (ZESTRIL) 5 MG tablet; Take 1 tablet (5 mg total) by mouth daily  Dispense: 90 tablet; Refill: 3    4. Impaired fasting glucose  - Hemoglobin A1C    5. Alport syndrome-like hereditary nephritis    6. Stage 3a chronic kidney disease  - Ambulatory referral to Nephrology    7. Other specified hypothyroidism  - Synthroid 50 MCG tablet; Take 1 tablet (50 mcg total) by mouth Once a day at 6:00am  Dispense: 90 tablet; Refill: 3  - TSH    8. Barrett's esophagus without dysplasia    9. Vitamin B12 deficiency    10. Polyneuropathy associated with underlying disease    11. Mild intermittent reactive airway disease without complication  - fluticasone (Flovent HFA) 44 MCG/ACT inhaler; Inhale 2 puffs into the lungs 2 (two) times daily  Dispense: 31.8 g; Refill: 3  - montelukast (SINGULAIR) 10 MG tablet; Take 1 tablet (10 mg total) by mouth nightly  Dispense: 90 tablet; Refill: 3    12. Non-seasonal allergic rhinitis due to other allergic trigger  - fluticasone (FLONASE) 50 MCG/ACT nasal spray; 2 sprays by Nasal route daily  Dispense: 48 g; Refill: 3  - levocetirizine (XYZAL) 5 MG tablet; Take 1 tablet (5 mg total) by mouth every evening  Dispense: 90 tablet; Refill: 3    13. Hyperuricemia  - allopurinol (ZYLOPRIM) 100 MG tablet; Take 2 tablets (200 mg total) by mouth daily  Dispense: 180 tablet; Refill: 3    14. Osteopenia of multiple sites  - Dxa Bone Density Axial Skeleton; Future  - Dxa Bone Density Axial Skeleton    15. Closed nondisplaced fracture of first metatarsal bone of right foot with delayed healing, subsequent encounter    16. Encounter for screening mammogram for malignant neoplasm of breast  - Mammo Digital 2D Screening Bilateral W CAD; Future  - Mammo Digital 2D Screening Bilateral W CAD    17. Encounter for vaccination  - Flu vaccine HIGH-DOSE  QUAD (PF) 65 yrs and older        Plan:     Trial of Nurtec for Migraine.    Please continue working on quality low fat low sugar diet as well as regular exercises. Will provide a hand-out for leg exercise.    Continue Lisinopril at 5 mg daily and monitor BP at home.  Desirable range of BP is 110/70-135/85  Please call if consistently out of range.    Continue Rosuvastatin.    Re-start Singulair to add to asthma management.    Follow-up with Podiatry.    Please have this blood test done in January.  Fasting is not necessary.    Mammogram and DEXA due in February.    Review of outside records and visit today took place over 45 min.    Latisa Belay Paschal Dopp, MD

## 2021-05-29 LAB — LIPID PANEL
Cholesterol / HDL Ratio: 4.3 ratio (ref 0.0–4.4)
Cholesterol: 200 mg/dL — ABNORMAL HIGH (ref 100–199)
HDL: 47 mg/dL (ref >39)
LDL Chol Calculated (NIH): 118 mg/dL — ABNORMAL HIGH (ref 0–99)
Triglycerides: 199 mg/dL — ABNORMAL HIGH (ref 0–149)
VLDL Calculated: 35 mg/dL (ref 5–40)

## 2021-05-29 LAB — HOMOCYSTEINE, SERUM: Homocysteine: 17.4 umol/L — ABNORMAL HIGH (ref 0.0–17.2)

## 2021-05-29 NOTE — Progress Notes (Signed)
LDL cholesterol is improving.

## 2021-06-01 ENCOUNTER — Ambulatory Visit (INDEPENDENT_AMBULATORY_CARE_PROVIDER_SITE_OTHER): Payer: BLUE CROSS/BLUE SHIELD | Admitting: Family Medicine

## 2021-06-01 ENCOUNTER — Encounter (INDEPENDENT_AMBULATORY_CARE_PROVIDER_SITE_OTHER): Payer: Self-pay | Admitting: Family Medicine

## 2021-06-01 VITALS — BP 130/74 | HR 80 | Temp 98.9°F | Resp 18 | Ht 69.2 in | Wt 208.2 lb

## 2021-06-01 DIAGNOSIS — J4520 Mild intermittent asthma, uncomplicated: Secondary | ICD-10-CM

## 2021-06-01 DIAGNOSIS — J3089 Other allergic rhinitis: Secondary | ICD-10-CM

## 2021-06-01 DIAGNOSIS — Q8781 Alport syndrome: Secondary | ICD-10-CM

## 2021-06-01 DIAGNOSIS — Z23 Encounter for immunization: Secondary | ICD-10-CM

## 2021-06-01 DIAGNOSIS — I10 Essential (primary) hypertension: Secondary | ICD-10-CM

## 2021-06-01 DIAGNOSIS — E538 Deficiency of other specified B group vitamins: Secondary | ICD-10-CM

## 2021-06-01 DIAGNOSIS — Z1231 Encounter for screening mammogram for malignant neoplasm of breast: Secondary | ICD-10-CM

## 2021-06-01 DIAGNOSIS — G43809 Other migraine, not intractable, without status migrainosus: Secondary | ICD-10-CM | POA: Insufficient documentation

## 2021-06-01 DIAGNOSIS — E7849 Other hyperlipidemia: Secondary | ICD-10-CM

## 2021-06-01 DIAGNOSIS — S92314G Nondisplaced fracture of first metatarsal bone, right foot, subsequent encounter for fracture with delayed healing: Secondary | ICD-10-CM

## 2021-06-01 DIAGNOSIS — K2270 Barrett's esophagus without dysplasia: Secondary | ICD-10-CM

## 2021-06-01 DIAGNOSIS — M8589 Other specified disorders of bone density and structure, multiple sites: Secondary | ICD-10-CM

## 2021-06-01 DIAGNOSIS — R7301 Impaired fasting glucose: Secondary | ICD-10-CM

## 2021-06-01 DIAGNOSIS — N1831 Chronic kidney disease, stage 3a: Secondary | ICD-10-CM

## 2021-06-01 DIAGNOSIS — G63 Polyneuropathy in diseases classified elsewhere: Secondary | ICD-10-CM

## 2021-06-01 DIAGNOSIS — E790 Hyperuricemia without signs of inflammatory arthritis and tophaceous disease: Secondary | ICD-10-CM

## 2021-06-01 DIAGNOSIS — E038 Other specified hypothyroidism: Secondary | ICD-10-CM

## 2021-06-01 MED ORDER — LISINOPRIL 5 MG PO TABS
5.0000 mg | ORAL_TABLET | Freq: Every day | ORAL | 3 refills | Status: DC
Start: 2021-06-01 — End: 2022-07-13

## 2021-06-01 MED ORDER — EZETIMIBE 10 MG PO TABS
10.0000 mg | ORAL_TABLET | Freq: Every day | ORAL | 3 refills | Status: DC
Start: 2021-06-01 — End: 2022-05-23

## 2021-06-01 MED ORDER — ALLOPURINOL 100 MG PO TABS
200.0000 mg | ORAL_TABLET | Freq: Every day | ORAL | 3 refills | Status: DC
Start: 2021-06-01 — End: 2022-05-23

## 2021-06-01 MED ORDER — FLUTICASONE PROPIONATE 50 MCG/ACT NA SUSP
2.0000 | Freq: Every day | NASAL | 3 refills | Status: DC
Start: 2021-06-01 — End: 2022-05-23

## 2021-06-01 MED ORDER — LEVOCETIRIZINE DIHYDROCHLORIDE 5 MG PO TABS
5.0000 mg | ORAL_TABLET | Freq: Every evening | ORAL | 3 refills | Status: DC
Start: 2021-06-01 — End: 2022-05-23

## 2021-06-01 MED ORDER — MONTELUKAST SODIUM 10 MG PO TABS
10.0000 mg | ORAL_TABLET | Freq: Every evening | ORAL | 3 refills | Status: DC
Start: 2021-06-01 — End: 2021-07-31

## 2021-06-01 MED ORDER — RIMEGEPANT SULFATE 75 MG PO TBDP
75.0000 mg | ORAL_TABLET | Freq: Every day | ORAL | 1 refills | Status: DC | PRN
Start: 2021-06-01 — End: 2023-03-19

## 2021-06-01 MED ORDER — ROSUVASTATIN CALCIUM 20 MG PO TABS
20.0000 mg | ORAL_TABLET | Freq: Every day | ORAL | 3 refills | Status: DC
Start: 2021-06-01 — End: 2021-08-30

## 2021-06-01 MED ORDER — FLUTICASONE PROPIONATE HFA 44 MCG/ACT IN AERO
2.0000 | INHALATION_SPRAY | Freq: Two times a day (BID) | RESPIRATORY_TRACT | 3 refills | Status: DC
Start: 2021-06-01 — End: 2022-05-23

## 2021-06-01 MED ORDER — SYNTHROID 50 MCG PO TABS
50.0000 ug | ORAL_TABLET | Freq: Every day | ORAL | 3 refills | Status: DC
Start: 2021-06-01 — End: 2022-05-23

## 2021-06-01 NOTE — Patient Instructions (Signed)
Trial of Nurtec for Migraine.    Please continue working on quality low fat low sugar diet as well as regular exercises. Will provide a hand-out for leg exercise.    Continue Lisinopril at 5 mg daily and monitor BP at home.  Desirable range of BP is 110/70-135/85  Please call if consistently out of range.    Continue Rosuvastatin.    Re-start Singulair to add to asthma management.    Follow-up with Podiatry.    Please have this blood test done in January.  Fasting is not necessary.    Mammogram and DEXA due in February.

## 2021-06-02 ENCOUNTER — Encounter (INDEPENDENT_AMBULATORY_CARE_PROVIDER_SITE_OTHER): Payer: Self-pay | Admitting: Family Medicine

## 2021-07-01 ENCOUNTER — Encounter (INDEPENDENT_AMBULATORY_CARE_PROVIDER_SITE_OTHER): Payer: Self-pay | Admitting: Family Medicine

## 2021-09-24 LAB — TSH: TSH: 0.941 u[IU]/mL (ref 0.450–4.500)

## 2021-09-24 LAB — HEMOGLOBIN A1C: Hemoglobin A1C: 6.4 % — ABNORMAL HIGH (ref 4.8–5.6)

## 2021-09-25 NOTE — Progress Notes (Signed)
Thyroid is in a good range. A1C is on the rise.

## 2021-11-08 ENCOUNTER — Ambulatory Visit (INDEPENDENT_AMBULATORY_CARE_PROVIDER_SITE_OTHER): Payer: BLUE CROSS/BLUE SHIELD | Admitting: Family Medicine

## 2021-11-11 NOTE — Progress Notes (Signed)
This mammogram showed no sign of breast cancer.

## 2021-11-23 ENCOUNTER — Other Ambulatory Visit (INDEPENDENT_AMBULATORY_CARE_PROVIDER_SITE_OTHER): Payer: Self-pay | Admitting: Family Medicine

## 2021-11-23 DIAGNOSIS — E7849 Other hyperlipidemia: Secondary | ICD-10-CM

## 2022-01-26 NOTE — Progress Notes (Signed)
Subjective:      Patient ID: Gertie Feylene Gosch is a 71 y.o. female     Chief Complaint   Patient presents with    Chronic care     Hypertension, hyperlipidemia        She is seen for well visit and ch care recheck.  Was seen by me last in 05/2021.    Divides her time helping caring [place] and [place]. The daughter in [place] had a difficult labor for her first baby 10/2019. She is there for extended periods of time to help.    Smoking: in college.  Alcohol: not much.  Diet: balanced  Exercise: regualrly  PAP with HPV was done in 02/2017.  Mammo 10/2021, [facility].  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 Yvonna AlanisMirkin.  HCV neg.  Immuniz: COVID?  FH of colon, breast, ovary, skin CA: no.  FH of DM, heart d: brother passed away with MI in 10/2015. He had PKD and was on dialysis. Daughter with Alport synd.    For ch care:  1)Periph neuropathy  Noted bleeding on her foot that she did not realize that she had decreased sensation of her feet.  She had a Neurological eval per Dr. Sue LushAmy Stone in the past.  Saw Dr. Jovita GammaMonti 04/2020. Had NCS 01/2020 showed mildly progressed sensory and motor neuropathy bilat LE's.  Labs 01/2020: low B12 at 281, now on suppl.  Homocysteine 16 and mildly elevated.  SPEP showing hypogammaglobulinemia. ANA neg.  B12 1216 in 08/2020, but homocysteine still at 17.4.  On Lyrica 50 bid, and notes improvement.  Gabapentin caused excessive lethargy.  Had a recheck Neurology visit.    2)CKD 3, renal cyst, Alports synd  On Na Bicarb.  BUN/Creat 28/1.46 in 05/2016. eGFR 38, 36/1.73 in 10/2016.  27/1.39 in 12/2016.  21/1.77 in 09/2017, eGFR 30, K 5.6. Was instructed to cut Lisinopril to 20 mg.  22/1.38 in 09/2019,  24/1.61/33 in 08/2020,  30/1.59/35 in 03/2021,  34/1.52/37 in 09/2021.  24 hr urine pro 288, cr cl 49 in 03/2018.   Urine microalbumin ratio 1115 in 04/2019, 1459 in 09/2019, 716 in 02/2020, 429 in 08/2020.  CBC nl in 09/2021.  Abd/pelvic CT 2012 left renal cyst 5 cm.  Renal sono 03/2017, exophytic cyst x 2  largest at 8 cm.  Saw Dr. Tereso NewcomerMusio then Dr. Lane HackerHarley 09/2021, note and BW result reviewed.     2)Hyperuricemia, kidney stone  On Allopurinol 200 (Uloric not covered by ins).  Uric acid: 4.7 in 12/2017, 3.9 in 03/2018, 5.1 in 01/2021.    3)Hypertension:  On Lisinopril 5 mg (reduced gradually from 20 mg).   Cartia XT 120 held by Dr. Tereso NewcomerMusio. Off Amlodipine.  EST in 02/2015 nl.   Echo in 01/2015 showed mild LVH and tr tricuspid regurg and mild aortic valve sclerosis without stenosis  Home BP: jn good ranges.    4)Hyperlipidemia, fatty liver  On Zetia 10, Crestor 20, Lovaza 2 gm. Off Simvastatin 60.  Lipids: 200/199/47/118 in 05/2021, 222/186/47/142 in 01/2021, 220/199/45/139 in 08/2020, 232/124/61/149 in 02/2020, 185/148/54/101 in 10/2017, 214/212/47/125 in 10/2016.    5)Hypothyroidism:  Currently taking 50 mcg of brand name Synthroid daily.  TSH in a good range 09/2021.  Sono 12/2017, nodular thyroid without change.    6)IFG  A1C 6.0 in 10/2016, 5.8 in 03/2018, 6.2 in 08/2020, 6.1 in 01/2021, 6.4 in 09/2021.    7)GERD, Barrett's since 2013  Back on Prevacid, off Nexium due to nausea., Pepcid PRN.  EGD  04/2018 Dr. Lilia Pro, no Barretts, but + esophagitis despite Prevacid.  Update EGD 04/2020, Dr. Yvonna Alanis, no Barretts, but [facility].  GI visit 06/2021.  Symptoms: none as long as she is on med and stays on diet. Na bicarb helping.    8)OSA  mod non-positional OSA per home testing 01/2018. Was referred for titration study, but she can't keep CPAP on.    9)Exercise induced asthma, allergy  Xyzal, Flonase, Flovent PRN, Singulair.    10) Migraine   Took Imitrex or Relpax but had severe fatigue from taking them.   Nurtec rx was given for a trial. Doing well on it.    11) Osteopenia  DEXA T score -1.8 fem neck in 09/2019, -1.7 in 05/2017, -1.5 in 2011.  Vit D 41.7 in 10/2018.    11) MSK pain  -Left shoulder rotator cuff tendinopathy 12/2019. Saw Sports Med and had inj. Had PT.   -Left lateral knee and hip pain   -Recurring tailbone pain. Was referred to  PT  -S/p left foot neuroma and right 1st MTP fx, saw Dr. Kathlynn Grate 04/2021.    12) BMI>30  Weight change: 10 pounds loss, was 213 in 12/2019.          The following sections were reviewed this encounter by the provider:   Tobacco  Allergies  Meds  Problems  Med Hx  Surg Hx  Fam Hx         Review of Systems   Constitutional:  Negative for unexpected weight change.   Eyes:  Negative for visual disturbance.   Respiratory:  Negative for shortness of breath.    Cardiovascular:  Negative for chest pain, palpitations and leg swelling.   Gastrointestinal:  Negative for abdominal pain, constipation and diarrhea.   Genitourinary:  Negative for dysuria and frequency.   Musculoskeletal:  Positive for arthralgias. Negative for myalgias.          BP 113/71   Pulse 87   Temp 98.4 F (36.9 C)   Resp 16   Wt 96.3 kg (212 lb 6.4 oz)   BMI 31.18 kg/m     Objective:     Physical Exam  Constitutional:       General: She is not in acute distress.     Appearance: Normal appearance. She is not ill-appearing.   HENT:      Head: Normocephalic and atraumatic.      Nose: Nose normal.   Eyes:      Extraocular Movements: Extraocular movements intact.      Conjunctiva/sclera: Conjunctivae normal.   Neck:      Thyroid: No thyroid mass or thyromegaly.      Vascular: No carotid bruit.   Cardiovascular:      Rate and Rhythm: Regular rhythm.      Heart sounds: Murmur heard.   Pulmonary:      Effort: Pulmonary effort is normal. No respiratory distress.   Musculoskeletal:         General: Normal range of motion.   Lymphadenopathy:      Cervical: No cervical adenopathy.   Neurological:      General: No focal deficit present.      Mental Status: She is alert.      Gait: Gait normal.   Psychiatric:         Mood and Affect: Mood normal.         Behavior: Behavior normal.          Assessment:     1.  Well adult exam    2. Benign essential hypertension  - semaglutide (WEGOVY) 0.25 MG/0.5ML injection; Inject 0.5 mLs (0.25 mg) into the skin once a  week  Dispense: 4 each; Refill: 0    3. Stage 3a chronic kidney disease  - semaglutide (WEGOVY) 0.25 MG/0.5ML injection; Inject 0.5 mLs (0.25 mg) into the skin once a week  Dispense: 4 each; Refill: 0    4. Alport syndrome-like hereditary nephritis  - semaglutide (WEGOVY) 0.25 MG/0.5ML injection; Inject 0.5 mLs (0.25 mg) into the skin once a week  Dispense: 4 each; Refill: 0    5. Other hyperlipidemia  - semaglutide (WEGOVY) 0.25 MG/0.5ML injection; Inject 0.5 mLs (0.25 mg) into the skin once a week  Dispense: 4 each; Refill: 0  - Lipid panel; Future  - Hepatic function panel (LFT); Future  - rosuvastatin (CRESTOR) 20 MG tablet; Take 1 tablet (20 mg) by mouth daily  Dispense: 90 tablet; Refill: 3  - omega-3 acid ethyl esters (LOVAZA) 1 g capsule; Take 1 capsule (1 g) by mouth 2 (two) times daily  Dispense: 180 capsule; Refill: 3    6. Impaired fasting glucose  - semaglutide (WEGOVY) 0.25 MG/0.5ML injection; Inject 0.5 mLs (0.25 mg) into the skin once a week  Dispense: 4 each; Refill: 0  - Hemoglobin A1C; Future    7. Obstructive sleep apnea syndrome  - semaglutide (WEGOVY) 0.25 MG/0.5ML injection; Inject 0.5 mLs (0.25 mg) into the skin once a week  Dispense: 4 each; Refill: 0    8. Fatty liver  - semaglutide (WEGOVY) 0.25 MG/0.5ML injection; Inject 0.5 mLs (0.25 mg) into the skin once a week  Dispense: 4 each; Refill: 0    9. Polyneuropathy associated with underlying disease    10. Mild intermittent reactive airway disease without complication  - montelukast (SINGULAIR) 10 MG tablet; Take 1 tablet (10 mg) by mouth nightly  Dispense: 90 tablet; Refill: 3    11. Other specified hypothyroidism    12. Osteopenia of multiple sites    13. Hyperuricemia    14. Obesity with body mass index 30 or greater  - semaglutide (WEGOVY) 0.25 MG/0.5ML injection; Inject 0.5 mLs (0.25 mg) into the skin once a week  Dispense: 4 each; Refill: 0          Plan:     Trial of Wegovey.    Please continue working on quality low fat low sugar  diet as well as regular exercises. Will provide a hand-out for leg exercise.    Continue Lisinopril at 5 mg daily and monitor BP at home.  Desirable range of BP is 110/70-135/85  Please call if consistently out of range.    Continue Rosuvastatin 20 mg daily.    Continue Singulair for asthma management.    Please have this blood test done after 8 hours fasting in August/September.  You may drink water and take your medications.    DEXA due.    Review of outside records and visit today took place over 45 min.    Kallin Henk Paschal Dopp, MD

## 2022-01-31 ENCOUNTER — Encounter (INDEPENDENT_AMBULATORY_CARE_PROVIDER_SITE_OTHER): Payer: Self-pay | Admitting: Family Medicine

## 2022-02-06 ENCOUNTER — Encounter (INDEPENDENT_AMBULATORY_CARE_PROVIDER_SITE_OTHER): Payer: Self-pay | Admitting: Family Medicine

## 2022-02-06 ENCOUNTER — Ambulatory Visit (INDEPENDENT_AMBULATORY_CARE_PROVIDER_SITE_OTHER): Payer: BLUE CROSS/BLUE SHIELD | Admitting: Family Medicine

## 2022-02-06 VITALS — BP 113/71 | HR 87 | Temp 98.4°F | Resp 16 | Wt 212.4 lb

## 2022-02-06 DIAGNOSIS — N1831 Chronic kidney disease, stage 3a: Secondary | ICD-10-CM

## 2022-02-06 DIAGNOSIS — M8589 Other specified disorders of bone density and structure, multiple sites: Secondary | ICD-10-CM

## 2022-02-06 DIAGNOSIS — E038 Other specified hypothyroidism: Secondary | ICD-10-CM

## 2022-02-06 DIAGNOSIS — I10 Essential (primary) hypertension: Secondary | ICD-10-CM

## 2022-02-06 DIAGNOSIS — E7849 Other hyperlipidemia: Secondary | ICD-10-CM

## 2022-02-06 DIAGNOSIS — R7301 Impaired fasting glucose: Secondary | ICD-10-CM

## 2022-02-06 DIAGNOSIS — G63 Polyneuropathy in diseases classified elsewhere: Secondary | ICD-10-CM

## 2022-02-06 DIAGNOSIS — K760 Fatty (change of) liver, not elsewhere classified: Secondary | ICD-10-CM

## 2022-02-06 DIAGNOSIS — G4733 Obstructive sleep apnea (adult) (pediatric): Secondary | ICD-10-CM

## 2022-02-06 DIAGNOSIS — Z0000 Encounter for general adult medical examination without abnormal findings: Secondary | ICD-10-CM

## 2022-02-06 DIAGNOSIS — E669 Obesity, unspecified: Secondary | ICD-10-CM

## 2022-02-06 DIAGNOSIS — E790 Hyperuricemia without signs of inflammatory arthritis and tophaceous disease: Secondary | ICD-10-CM

## 2022-02-06 DIAGNOSIS — J4520 Mild intermittent asthma, uncomplicated: Secondary | ICD-10-CM

## 2022-02-06 DIAGNOSIS — Q8781 Alport syndrome: Secondary | ICD-10-CM

## 2022-02-06 MED ORDER — ROSUVASTATIN CALCIUM 20 MG PO TABS
20.0000 mg | ORAL_TABLET | Freq: Every day | ORAL | 3 refills | Status: DC
Start: 2022-02-06 — End: 2023-01-11

## 2022-02-06 MED ORDER — SEMAGLUTIDE-WEIGHT MANAGEMENT 0.25 MG/0.5ML SC SOAJ
0.2500 mg | SUBCUTANEOUS | 0 refills | Status: DC
Start: 2022-02-06 — End: 2022-03-06

## 2022-02-06 MED ORDER — OMEGA-3-ACID ETHYL ESTERS 1 G PO CAPS
1.0000 | ORAL_CAPSULE | Freq: Two times a day (BID) | ORAL | 3 refills | Status: DC
Start: 2022-02-06 — End: 2023-03-19

## 2022-02-06 MED ORDER — MONTELUKAST SODIUM 10 MG PO TABS
10.0000 mg | ORAL_TABLET | Freq: Every evening | ORAL | 3 refills | Status: DC
Start: 2022-02-06 — End: 2023-01-11

## 2022-02-06 NOTE — Patient Instructions (Signed)
Trial of Wegovey.    Please continue working on quality low fat low sugar diet as well as regular exercises. Will provide a hand-out for leg exercise.    Continue Lisinopril at 5 mg daily and monitor BP at home.  Desirable range of BP is 110/70-135/85  Please call if consistently out of range.    Continue Rosuvastatin 20 mg daily.    Continue Singulair for asthma management.    Please have this blood test done after 8 hours fasting in August/September.  You may drink water and take your medications.    DEXA due.

## 2022-02-17 ENCOUNTER — Encounter (INDEPENDENT_AMBULATORY_CARE_PROVIDER_SITE_OTHER): Payer: Self-pay | Admitting: Family Medicine

## 2022-02-20 ENCOUNTER — Encounter (INDEPENDENT_AMBULATORY_CARE_PROVIDER_SITE_OTHER): Payer: Self-pay | Admitting: Family Medicine

## 2022-02-21 NOTE — Progress Notes (Signed)
02/20/2022 Appt. Sched. Task closed ~TB

## 2022-03-06 ENCOUNTER — Other Ambulatory Visit (INDEPENDENT_AMBULATORY_CARE_PROVIDER_SITE_OTHER): Payer: Self-pay | Admitting: Family Medicine

## 2022-03-06 ENCOUNTER — Encounter (INDEPENDENT_AMBULATORY_CARE_PROVIDER_SITE_OTHER): Payer: Self-pay | Admitting: Family Medicine

## 2022-03-06 DIAGNOSIS — E7849 Other hyperlipidemia: Secondary | ICD-10-CM

## 2022-03-06 DIAGNOSIS — R7301 Impaired fasting glucose: Secondary | ICD-10-CM

## 2022-03-06 DIAGNOSIS — E669 Obesity, unspecified: Secondary | ICD-10-CM

## 2022-03-06 DIAGNOSIS — Q8781 Alport syndrome: Secondary | ICD-10-CM

## 2022-03-06 DIAGNOSIS — I10 Essential (primary) hypertension: Secondary | ICD-10-CM

## 2022-03-06 DIAGNOSIS — K760 Fatty (change of) liver, not elsewhere classified: Secondary | ICD-10-CM

## 2022-03-06 DIAGNOSIS — G4733 Obstructive sleep apnea (adult) (pediatric): Secondary | ICD-10-CM

## 2022-03-06 DIAGNOSIS — N1831 Chronic kidney disease, stage 3a: Secondary | ICD-10-CM

## 2022-03-06 MED ORDER — SEMAGLUTIDE-WEIGHT MANAGEMENT 0.5 MG/0.5ML SC SOAJ
0.5000 mg | SUBCUTANEOUS | 0 refills | Status: DC
Start: 2022-03-06 — End: 2022-03-08

## 2022-03-08 ENCOUNTER — Other Ambulatory Visit (INDEPENDENT_AMBULATORY_CARE_PROVIDER_SITE_OTHER): Payer: Self-pay | Admitting: Family Medicine

## 2022-03-08 DIAGNOSIS — K760 Fatty (change of) liver, not elsewhere classified: Secondary | ICD-10-CM

## 2022-03-08 DIAGNOSIS — Q8781 Alport syndrome: Secondary | ICD-10-CM

## 2022-03-08 DIAGNOSIS — N1831 Chronic kidney disease, stage 3a: Secondary | ICD-10-CM

## 2022-03-08 DIAGNOSIS — E669 Obesity, unspecified: Secondary | ICD-10-CM

## 2022-03-08 DIAGNOSIS — R7301 Impaired fasting glucose: Secondary | ICD-10-CM

## 2022-03-08 DIAGNOSIS — E7849 Other hyperlipidemia: Secondary | ICD-10-CM

## 2022-03-08 DIAGNOSIS — G4733 Obstructive sleep apnea (adult) (pediatric): Secondary | ICD-10-CM

## 2022-03-08 DIAGNOSIS — I10 Essential (primary) hypertension: Secondary | ICD-10-CM

## 2022-03-08 MED ORDER — LIRAGLUTIDE -WEIGHT MANAGEMENT 18 MG/3ML SC SOPN
PEN_INJECTOR | SUBCUTANEOUS | 0 refills | Status: DC
Start: 2022-03-08 — End: 2022-05-26

## 2022-03-17 ENCOUNTER — Telehealth (INDEPENDENT_AMBULATORY_CARE_PROVIDER_SITE_OTHER): Payer: Self-pay

## 2022-03-17 NOTE — Telephone Encounter (Signed)
A PA has been initiated through [employer] (Key: [id number])  Rx #: [id number]  Saxenda 18MG pen-injectors    Chart Reviewed and Clinical criteria investigated   FEP - SAXENDA (02/10/2022)    What is the patient's age in years?  2  What is the patient's diagnosis?  Obesity, used for chronic weight management  Will the patient use this medication in combination with lifestyle changes and a reduced calorie diet?   YES  Has the patient been on this medication continuously for the last 4 months excluding samples?   NO  Please confirm the patient's age.  70 years of age or older  What is the patient's body mass index (BMI) in kilograms per square meter (kg/m2)?  Greater than or equal to 30 kg/m2  Will this medication be used in combination with other glucagon-like peptide-1 (GLP-1) receptor agonists? If yes, please specify the medication. Other GLP-1 receptor agonists examples include Mounjaro, Rybelsus, Soliqua, or Xultophy etc.  No  Will this medication be used in combination with another Prior Authorization (PA) medication for weight loss? If yes, please specify the medication.  No  PA was approved for wegovy but due to shortage issues, patient is being switched to 15  Will the patient need more than 15 pre-filled pens every 90 days?  No      Patients demographics submitted. Prior Authorization has been submitted and pending   Your information has been submitted to [employer]. To check for an updated outcome later, reopen this PA request from your dashboard.    24h turnaround    Awaiting coverage determination from plan and Patient notified via mychart message to follow up with pharmacy after 24 hours and to outreach if needed

## 2022-04-19 ENCOUNTER — Other Ambulatory Visit (INDEPENDENT_AMBULATORY_CARE_PROVIDER_SITE_OTHER): Payer: Self-pay | Admitting: Family Medicine

## 2022-04-19 DIAGNOSIS — K760 Fatty (change of) liver, not elsewhere classified: Secondary | ICD-10-CM

## 2022-04-19 DIAGNOSIS — Q8781 Alport syndrome: Secondary | ICD-10-CM

## 2022-04-19 DIAGNOSIS — E7849 Other hyperlipidemia: Secondary | ICD-10-CM

## 2022-04-19 DIAGNOSIS — I10 Essential (primary) hypertension: Secondary | ICD-10-CM

## 2022-04-19 DIAGNOSIS — G4733 Obstructive sleep apnea (adult) (pediatric): Secondary | ICD-10-CM

## 2022-04-19 DIAGNOSIS — N1831 Chronic kidney disease, stage 3a: Secondary | ICD-10-CM

## 2022-04-19 DIAGNOSIS — R7301 Impaired fasting glucose: Secondary | ICD-10-CM

## 2022-04-19 DIAGNOSIS — E669 Obesity, unspecified: Secondary | ICD-10-CM

## 2022-04-25 ENCOUNTER — Encounter (INDEPENDENT_AMBULATORY_CARE_PROVIDER_SITE_OTHER): Payer: Self-pay | Admitting: Family Medicine

## 2022-04-25 ENCOUNTER — Other Ambulatory Visit (INDEPENDENT_AMBULATORY_CARE_PROVIDER_SITE_OTHER): Payer: Self-pay | Admitting: Family Medicine

## 2022-04-25 MED ORDER — LIRAGLUTIDE -WEIGHT MANAGEMENT 18 MG/3ML SC SOPN
3.0000 mg | PEN_INJECTOR | Freq: Every day | SUBCUTANEOUS | 1 refills | Status: DC
Start: 2022-04-25 — End: 2022-05-26

## 2022-05-23 ENCOUNTER — Other Ambulatory Visit (INDEPENDENT_AMBULATORY_CARE_PROVIDER_SITE_OTHER): Payer: Self-pay | Admitting: Family Medicine

## 2022-05-23 DIAGNOSIS — E7849 Other hyperlipidemia: Secondary | ICD-10-CM

## 2022-05-23 DIAGNOSIS — J4520 Mild intermittent asthma, uncomplicated: Secondary | ICD-10-CM

## 2022-05-23 DIAGNOSIS — E790 Hyperuricemia without signs of inflammatory arthritis and tophaceous disease: Secondary | ICD-10-CM

## 2022-05-23 DIAGNOSIS — J3089 Other allergic rhinitis: Secondary | ICD-10-CM

## 2022-05-23 DIAGNOSIS — E038 Other specified hypothyroidism: Secondary | ICD-10-CM

## 2022-05-25 ENCOUNTER — Other Ambulatory Visit (FREE_STANDING_LABORATORY_FACILITY): Payer: BLUE CROSS/BLUE SHIELD

## 2022-05-25 ENCOUNTER — Encounter (INDEPENDENT_AMBULATORY_CARE_PROVIDER_SITE_OTHER): Payer: Self-pay | Admitting: Family Medicine

## 2022-05-25 DIAGNOSIS — E7849 Other hyperlipidemia: Secondary | ICD-10-CM

## 2022-05-25 DIAGNOSIS — R7301 Impaired fasting glucose: Secondary | ICD-10-CM

## 2022-05-25 LAB — LIPID PANEL
Cholesterol / HDL Ratio: 4 Index
Cholesterol: 198 mg/dL (ref 0–199)
HDL: 50 mg/dL (ref 40–9999)
LDL Calculated: 115 mg/dL — ABNORMAL HIGH (ref 0–99)
Triglycerides: 164 mg/dL — ABNORMAL HIGH (ref 34–149)
VLDL Calculated: 33 mg/dL (ref 10–40)

## 2022-05-25 LAB — HEPATIC FUNCTION PANEL
ALT: 32 U/L (ref 0–55)
AST (SGOT): 25 U/L (ref 5–41)
Albumin/Globulin Ratio: 1.7 (ref 0.9–2.2)
Albumin: 4 g/dL (ref 3.5–5.0)
Alkaline Phosphatase: 77 U/L (ref 37–117)
Bilirubin Direct: 0.2 mg/dL (ref 0.0–0.5)
Bilirubin Indirect: 0.4 mg/dL (ref 0.2–1.0)
Bilirubin, Total: 0.6 mg/dL (ref 0.2–1.2)
Globulin: 2.4 g/dL (ref 2.0–3.6)
Protein, Total: 6.4 g/dL (ref 6.0–8.3)

## 2022-05-25 LAB — HEMOGLOBIN A1C
Average Estimated Glucose: 116.9 mg/dL
Hemoglobin A1C: 5.7 % — ABNORMAL HIGH (ref 4.6–5.6)

## 2022-05-25 LAB — HEMOLYSIS INDEX: Hemolysis Index: 6 Index (ref 0–24)

## 2022-05-25 NOTE — Progress Notes (Signed)
Lipids remain in good ranges.  Diabetes risk is nearly gone.  Liver enzymes are normal.  I assume that you are taking Saxenda 3 mg daily. I hope you are doing well on it with some weight loss and without side effects.

## 2022-05-26 ENCOUNTER — Other Ambulatory Visit (INDEPENDENT_AMBULATORY_CARE_PROVIDER_SITE_OTHER): Payer: Self-pay | Admitting: Family Medicine

## 2022-05-26 DIAGNOSIS — E7849 Other hyperlipidemia: Secondary | ICD-10-CM

## 2022-05-26 DIAGNOSIS — I10 Essential (primary) hypertension: Secondary | ICD-10-CM

## 2022-05-26 DIAGNOSIS — R7301 Impaired fasting glucose: Secondary | ICD-10-CM

## 2022-05-26 DIAGNOSIS — E669 Obesity, unspecified: Secondary | ICD-10-CM

## 2022-05-26 DIAGNOSIS — G4733 Obstructive sleep apnea (adult) (pediatric): Secondary | ICD-10-CM

## 2022-05-26 DIAGNOSIS — K760 Fatty (change of) liver, not elsewhere classified: Secondary | ICD-10-CM

## 2022-05-26 MED ORDER — SEMAGLUTIDE-WEIGHT MANAGEMENT 0.25 MG/0.5ML SC SOAJ
0.2500 mg | SUBCUTANEOUS | 2 refills | Status: DC
Start: 2022-05-26 — End: 2022-05-26

## 2022-05-26 MED ORDER — SEMAGLUTIDE-WEIGHT MANAGEMENT 0.25 MG/0.5ML SC SOAJ
0.2500 mg | SUBCUTANEOUS | 0 refills | Status: DC
Start: 2022-05-26 — End: 2022-05-28

## 2022-05-28 ENCOUNTER — Other Ambulatory Visit (INDEPENDENT_AMBULATORY_CARE_PROVIDER_SITE_OTHER): Payer: Self-pay | Admitting: Family Medicine

## 2022-05-28 DIAGNOSIS — K760 Fatty (change of) liver, not elsewhere classified: Secondary | ICD-10-CM

## 2022-05-28 DIAGNOSIS — E669 Obesity, unspecified: Secondary | ICD-10-CM

## 2022-05-28 DIAGNOSIS — E7849 Other hyperlipidemia: Secondary | ICD-10-CM

## 2022-05-28 DIAGNOSIS — G4733 Obstructive sleep apnea (adult) (pediatric): Secondary | ICD-10-CM

## 2022-05-28 DIAGNOSIS — R7301 Impaired fasting glucose: Secondary | ICD-10-CM

## 2022-05-28 DIAGNOSIS — I10 Essential (primary) hypertension: Secondary | ICD-10-CM

## 2022-05-30 ENCOUNTER — Telehealth (INDEPENDENT_AMBULATORY_CARE_PROVIDER_SITE_OTHER): Payer: Self-pay

## 2022-05-30 NOTE — Telephone Encounter (Signed)
[facility] called. They wanted you to know that each  time you increase the dose of wegovy you need to send a new prescription. Now she is on 0.25mg .  in 4 weeks a new rx must be sent for the 0.5mg .

## 2022-06-12 ENCOUNTER — Other Ambulatory Visit (INDEPENDENT_AMBULATORY_CARE_PROVIDER_SITE_OTHER): Payer: Self-pay | Admitting: Family Medicine

## 2022-06-12 DIAGNOSIS — E7849 Other hyperlipidemia: Secondary | ICD-10-CM

## 2022-06-12 DIAGNOSIS — K760 Fatty (change of) liver, not elsewhere classified: Secondary | ICD-10-CM

## 2022-06-12 DIAGNOSIS — I10 Essential (primary) hypertension: Secondary | ICD-10-CM

## 2022-06-12 DIAGNOSIS — G4733 Obstructive sleep apnea (adult) (pediatric): Secondary | ICD-10-CM

## 2022-06-12 DIAGNOSIS — E669 Obesity, unspecified: Secondary | ICD-10-CM

## 2022-06-12 DIAGNOSIS — R7301 Impaired fasting glucose: Secondary | ICD-10-CM

## 2022-06-12 DIAGNOSIS — N1831 Chronic kidney disease, stage 3a: Secondary | ICD-10-CM

## 2022-06-12 MED ORDER — SAXENDA 18 MG/3ML SC SOPN
PEN_INJECTOR | SUBCUTANEOUS | 0 refills | Status: DC
Start: 2022-06-12 — End: 2022-06-12

## 2022-06-15 ENCOUNTER — Telehealth (INDEPENDENT_AMBULATORY_CARE_PROVIDER_SITE_OTHER): Payer: Self-pay

## 2022-06-15 NOTE — Telephone Encounter (Signed)
Patient calling requesting  PA  on this prescription be started liraglutide (Saxenda) 18 MG/3ML injection pen

## 2022-06-16 ENCOUNTER — Telehealth (INDEPENDENT_AMBULATORY_CARE_PROVIDER_SITE_OTHER): Payer: Self-pay

## 2022-06-16 NOTE — Telephone Encounter (Signed)
A PA has been initiated through [employer] (Key: [id number] [id number])  Saxenda 18mg /46ml pen-injectors    Chart Reviewed and Clinical criteria investigated   What is the patient's age in years?  71  Is this medication being used for chronic weight management?  YES  Will the patient use this medication in combination with lifestyle changes and a reduced calorie diet?  YES  Has the patient been on this medication continuously for the last 4 months excluding samples?  NO  Please confirm the patient's age.  70 years of age or older  21  What is the patient's body mass index (BMI) in kilograms per square meter (kg/m2)?  Greater than or equal to 30 kg/m2  Is the patient currently using another glucagon-like peptide-1 (GLP-1) receptor agonist? If yes, is this a change in therapy or a request for an additional GLP-1? Other GLP-1 receptor agonists examples include Mounjaro, Rybelsus, Soliqua, or Xultophy etc.  Change from another GLP-1, please specify the medication.  67  Will this medication be used in combination with another Prior Authorization (PA) medication for weight loss? If yes, please specify the medication.  No  Will the patient need more than 15 pre-filled pens every 90 days?  No  Document upload: OFV notes from 02/06/22    Prior Authorization submitted and APPROVED.  This is to inform you that your Prior Authorization request for the above member's Saxenda  (liraglutide) has been approved. If you are changing the member's therapy the previously approved  therapy will be canceled and replaced.  The authorization is valid from 05/17/2022 through 12/13/2022.     PA Process Complete  Provider/staff/patient notified.

## 2022-06-19 ENCOUNTER — Encounter (INDEPENDENT_AMBULATORY_CARE_PROVIDER_SITE_OTHER): Payer: Self-pay

## 2022-06-19 NOTE — Telephone Encounter (Signed)
PA for Saxenda approved. Will notify pt via mychart message.

## 2022-06-20 ENCOUNTER — Other Ambulatory Visit (INDEPENDENT_AMBULATORY_CARE_PROVIDER_SITE_OTHER): Payer: Self-pay | Admitting: Family Nurse Practitioner

## 2022-06-20 DIAGNOSIS — G4733 Obstructive sleep apnea (adult) (pediatric): Secondary | ICD-10-CM

## 2022-06-20 DIAGNOSIS — E669 Obesity, unspecified: Secondary | ICD-10-CM

## 2022-06-20 DIAGNOSIS — K760 Fatty (change of) liver, not elsewhere classified: Secondary | ICD-10-CM

## 2022-06-20 DIAGNOSIS — R7301 Impaired fasting glucose: Secondary | ICD-10-CM

## 2022-06-20 DIAGNOSIS — I10 Essential (primary) hypertension: Secondary | ICD-10-CM

## 2022-06-20 DIAGNOSIS — N1831 Chronic kidney disease, stage 3a: Secondary | ICD-10-CM

## 2022-06-20 DIAGNOSIS — E7849 Other hyperlipidemia: Secondary | ICD-10-CM

## 2022-06-20 MED ORDER — SAXENDA 18 MG/3ML SC SOPN
PEN_INJECTOR | SUBCUTANEOUS | 1 refills | Status: DC
Start: 2022-06-20 — End: 2022-06-21

## 2022-06-21 ENCOUNTER — Other Ambulatory Visit (INDEPENDENT_AMBULATORY_CARE_PROVIDER_SITE_OTHER): Payer: Self-pay | Admitting: Family Medicine

## 2022-06-21 DIAGNOSIS — K760 Fatty (change of) liver, not elsewhere classified: Secondary | ICD-10-CM

## 2022-06-21 DIAGNOSIS — G4733 Obstructive sleep apnea (adult) (pediatric): Secondary | ICD-10-CM

## 2022-06-21 DIAGNOSIS — N1831 Chronic kidney disease, stage 3a: Secondary | ICD-10-CM

## 2022-06-21 DIAGNOSIS — E669 Obesity, unspecified: Secondary | ICD-10-CM

## 2022-06-21 DIAGNOSIS — R7301 Impaired fasting glucose: Secondary | ICD-10-CM

## 2022-06-21 DIAGNOSIS — E7849 Other hyperlipidemia: Secondary | ICD-10-CM

## 2022-06-21 DIAGNOSIS — I10 Essential (primary) hypertension: Secondary | ICD-10-CM

## 2022-07-12 ENCOUNTER — Other Ambulatory Visit (INDEPENDENT_AMBULATORY_CARE_PROVIDER_SITE_OTHER): Payer: Self-pay | Admitting: Family Medicine

## 2022-07-12 DIAGNOSIS — E669 Obesity, unspecified: Secondary | ICD-10-CM

## 2022-07-12 DIAGNOSIS — G4733 Obstructive sleep apnea (adult) (pediatric): Secondary | ICD-10-CM

## 2022-07-12 DIAGNOSIS — N1831 Chronic kidney disease, stage 3a: Secondary | ICD-10-CM

## 2022-07-12 DIAGNOSIS — R7301 Impaired fasting glucose: Secondary | ICD-10-CM

## 2022-07-12 DIAGNOSIS — I10 Essential (primary) hypertension: Secondary | ICD-10-CM

## 2022-07-12 DIAGNOSIS — K760 Fatty (change of) liver, not elsewhere classified: Secondary | ICD-10-CM

## 2022-07-12 DIAGNOSIS — E7849 Other hyperlipidemia: Secondary | ICD-10-CM

## 2022-07-12 MED ORDER — SAXENDA 18 MG/3ML SC SOPN
PEN_INJECTOR | SUBCUTANEOUS | 1 refills | Status: DC
Start: 2022-07-12 — End: 2022-07-12

## 2022-07-13 ENCOUNTER — Other Ambulatory Visit (INDEPENDENT_AMBULATORY_CARE_PROVIDER_SITE_OTHER): Payer: Self-pay | Admitting: Family Medicine

## 2022-07-13 DIAGNOSIS — I10 Essential (primary) hypertension: Secondary | ICD-10-CM

## 2022-07-14 ENCOUNTER — Telehealth (INDEPENDENT_AMBULATORY_CARE_PROVIDER_SITE_OTHER): Payer: Self-pay | Admitting: Family Medicine

## 2022-07-14 NOTE — Telephone Encounter (Signed)
[facility] calling to let us know the Victoria Fields is on manufacturer back order with no release date given.  They suggest patient/office call to smaller, unknown pharmacies to see if they have supply still on shelves.

## 2022-07-14 NOTE — Telephone Encounter (Signed)
Sent pt message, awaiting response (10/2 encounter)

## 2022-07-21 ENCOUNTER — Encounter (INDEPENDENT_AMBULATORY_CARE_PROVIDER_SITE_OTHER): Payer: Self-pay | Admitting: Family Medicine

## 2022-08-20 ENCOUNTER — Telehealth (INDEPENDENT_AMBULATORY_CARE_PROVIDER_SITE_OTHER): Payer: Self-pay | Admitting: Family Medicine

## 2022-08-20 NOTE — Telephone Encounter (Signed)
Received a partial fax of her visit on 10/18 with [facility], only to a half of page 3 of 4.  Please request [facility] for a full visit note fax.

## 2022-08-21 NOTE — Telephone Encounter (Signed)
Sent request for documents

## 2022-08-29 ENCOUNTER — Encounter (INDEPENDENT_AMBULATORY_CARE_PROVIDER_SITE_OTHER): Payer: Self-pay | Admitting: Residents

## 2022-08-29 ENCOUNTER — Ambulatory Visit (INDEPENDENT_AMBULATORY_CARE_PROVIDER_SITE_OTHER): Payer: BLUE CROSS/BLUE SHIELD | Admitting: Residents

## 2022-08-29 VITALS — BP 137/81 | HR 73 | Temp 97.4°F | Resp 16 | Wt 200.0 lb

## 2022-08-29 DIAGNOSIS — H938X3 Other specified disorders of ear, bilateral: Secondary | ICD-10-CM

## 2022-08-29 DIAGNOSIS — R42 Dizziness and giddiness: Secondary | ICD-10-CM

## 2022-08-29 NOTE — Progress Notes (Signed)
[facility] [place] [facility] [place] [facility]                       Date of Exam: 08/29/2022 3:11 PM        Patient ID: Victoria Fields is a 71 y.o. female.  Attending Physician: Carney Bern, MD        Chief Complaint:    Chief Complaint   Patient presents with    Ear Fullness     And vertigo x Sunday s/p cruise                  HPI/ROS/EXAM      Patient seen today for ear fullness and vertigo    Vertigo started last week  Was on cruise and couldn't tell if it was rocking of the boat but since returning noticed symptom persisted  Feels a little off-balance  Ears feel full, have not fully improved since plane flight   Is worried she has an ear infection    She already uses flonase regularly    No hearing loss  No fevers, chills    Obtained meclizine for trip  Meclizine has helped with the vertigo symptoms    She has history of similar vertigo  Has had PT for this  Diagnosed with Allport syndrome which impacts ears      BP 137/81 (BP Site: Left arm, Patient Position: Sitting, Cuff Size: Large)   Pulse 73   Temp 97.4 F (36.3 C) (Tympanic)   Resp 16   Wt 90.7 kg (200 lb)   BMI 29.36 kg/m      Physical Exam  Constitutional:       General: She is not in acute distress.  HENT:      Right Ear: Tympanic membrane normal. There is no impacted cerumen.      Left Ear: There is no impacted cerumen.   Pulmonary:      Effort: Pulmonary effort is normal. No respiratory distress.   Skin:     General: Skin is warm and dry.   Neurological:      General: No focal deficit present.      Mental Status: She is alert.                  Problem List:    Patient Active Problem List   Diagnosis    Allergic rhinitis    Barrett's esophagus    Alport syndrome-like hereditary nephritis    Benign essential hypertension    Benign neoplasm of thyroid gland    Stage 3a chronic kidney disease    Hyperlipidemia    Hyperuricemia    Hypothyroidism    Impaired fasting glucose    Kidney stone    Obesity with body mass  index 30 or greater    Obstructive sleep apnea syndrome    Osteopenia    Reactive airway disease    Mitral valve regurgitation    Urine test positive for microalbuminuria    Screening for malignant neoplasm of breast    Fatty liver    Vitamin B12 deficiency    Polyneuropathy associated with underlying disease    Other migraine without status migrainosus, not intractable             Current Meds:    Outpatient Medications Marked as Taking for the 08/29/22 encounter (Office Visit) with Carney Bern, MD   Medication Sig Dispense Refill    albuterol sulfate HFA (Ventolin HFA) 108 (90  Base) MCG/ACT inhaler Inhale 2 puffs into the lungs every 4 (four) hours as needed for Wheezing or Shortness of Breath 1 Inhaler 3    allopurinol (ZYLOPRIM) 100 MG tablet TAKE 2 TABLETS (200MG       TOTAL) DAILY 180 tablet 0    b complex vitamins capsule Take 1 capsule by mouth daily      Calcium Citrate-Vitamin D (CALCIUM + D PO) Take by mouth      Cholecalciferol (Vitamin D) 50 MCG (2000 UT) Cap 2 (two) times daily         Cyanocobalamin (VITAMIN B-12 ER PO) Take 2,500 mg by mouth Daily with food      ezetimibe (ZETIA) 10 MG tablet TAKE 1 TABLET DAILY 90 tablet 0    Flovent HFA 44 MCG/ACT inhaler USE 2 INHALATIONS ORALLY   TWICE DAILY (Patient taking differently: 2 puffs 2 (two) times daily) 31.8 g 0    fluticasone (FLONASE) 50 MCG/ACT nasal spray USE 2 SPRAYS NASALLY DAILY 48 g 0    lansoprazole (PREVACID) 30 MG capsule Take 1 capsule (30 mg) by mouth daily      levocetirizine (XYZAL) 5 MG tablet TAKE 1 TABLET EVERY EVENING 90 tablet 0    lisinopril (ZESTRIL) 5 MG tablet TAKE ONE TABLET BY MOUTH DAILY 90 tablet 0    montelukast (SINGULAIR) 10 MG tablet Take 1 tablet (10 mg) by mouth nightly 90 tablet 3    omega-3 acid ethyl esters (LOVAZA) 1 g capsule Take 1 capsule (1 g) by mouth 2 (two) times daily 180 capsule 3    pregabalin (LYRICA) 25 MG capsule       Rimegepant Sulfate 75 MG Tablet Dispersible Take 1 tablet (75 mg total) by  mouth daily as needed (migraine) 12 tablet 1    rosuvastatin (CRESTOR) 20 MG tablet Take 1 tablet (20 mg) by mouth daily 90 tablet 3    sodium bicarbonate 650 MG tablet Take 1 tablet (650 mg) by mouth 2 (two) times daily      Synthroid 50 MCG tablet TAKE 1 TABLET ONCE DAILY AT6:00AM 90 tablet 0          Allergies:    Allergies   Allergen Reactions    Cephalosporins Anaphylaxis    Strawberry Extract Hives    Cefuroxime     Dust Mite Extract     Molds & Smuts     Niacin Itching    Pollen Extract     Tree Extract            Past History:    Past Surgical History:   Procedure Laterality Date    CATARACT EXTRACTION  11/16/2008    COLONOSCOPY  02/17/2015    COLONOSCOPY  08/08/2007    DILATION AND CURETTAGE OF UTERUS      EGD, COLONOSCOPY N/A 04/21/2020    Procedure: EGD, COLONOSCOPY;  Surgeon: Konrad Saha, MD;  Location: [facility];  Service: Gastroenterology;  Laterality: N/A;  EGD, COLONOSCOPY  Asst=N    EYE SURGERY Bilateral     cataract    TONSILECTOMY, ADENOIDECTOMY, BILATERAL MYRINGOTOMY AND TUBES      UPPER GASTROINTESTINAL ENDOSCOPY  04/24/2018     Family History   Problem Relation Age of Onset    Heart failure Mother     Osteoporosis Mother     Thyroid disease Mother     Hyperlipidemia Mother     Heart failure Father     Myocardial Infarction Father     Coronary  artery disease Father     Stent Father     Hyperlipidemia Father     Myocardial Infarction Maternal Grandfather     Myocardial Infarction Paternal Grandfather 76    Thyroid disease Sister     Hyperlipidemia Sister     Hyperlipidemia Brother     Sudden death Brother         cardiac    Kidney disease Brother     Asthma Brother     Thyroid disease Daughter     Hyperlipidemia Daughter      Social History     Tobacco Use    Smoking status: Never    Smokeless tobacco: Never   Vaping Use    Vaping Use: Never used   Substance Use Topics    Alcohol use: Yes     Comment: rare    Drug use: Never              The following sections were reviewed this  encounter by the provider:   Tobacco  Allergies  Meds  Problems  Med Hx  Surg Hx  Fam Hx               Assessment:    1. Sensation of fullness in both ears    2. Vertigo            Plan:      #Ear fullness  No signs of bacterial infection on exam  Suspect eustachian tube dysfunction after trip  Recommend trial of afrin nasal spray for maximum 3 days to help with drainage    #Vertigo  Symptomatic improvement with meclizine  Okay to continue  Has follow up with PCP next week to discuss if symptoms not improving          Follow-up:    09/06/2022        Carney Bern, MD

## 2022-08-30 NOTE — Progress Notes (Signed)
I reviewed Lindsey Casal-Roscum, MD's care of the patient during/immediately following the patient's visit.  I agree with the findings, assessment and plan as outlined in their note.      Tyrene Nader A Benoit Meech, MD

## 2022-09-02 NOTE — Progress Notes (Unsigned)
Subjective:      Patient ID: Victoria Fields is a 71 y.o. female     No chief complaint on file.       She is seen for ch care recheck.  Was seen by me last in 01/2022.    Was seen on 12/12 for recurring vertigo.  Meclizine helped.    Started on Wegovy since last visit and did well, but was not able to get more due to shortage and switched to [place], which also became out of stock.    Need recheck CBC and Vit D. DEXA due.    HM  Smoking: in college.  Alcohol: not much.  Diet: balanced  Exercise: regualrly  PAP with HPV was done in 02/2017.  Mammo 12-01-21, FRC.  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 Yvonna Alanis.  HCV neg.  Immuniz: UTD  FH of colon, breast, ovary, skin CA: no.  FH of DM, heart d: brother passed away with MI in 2015/12/02. He had PKD and was on dialysis. Daughter with Alport synd.    For ch care:  1)Periph neuropathy  On Lyrica 50 bid, and notes improvement.  Gabapentin caused excessive lethargy.  Originally noted when she had bleeding on her foot that she did not feel.  Neuro, Dr. Jovita Gamma   NCS 01/2020 showed mildly progressed sensory and motor neuropathy bilat LE's.  Labs 01/2020: low B12 at 281, now on suppl.  Homocysteine 16 and mildly elevated.  SPEP showing hypogammaglobulinemia. ANA neg.  B12 1216 in 08/2020, but homocysteine still at 17.4.  NCS 07/2022 showed same mild axonal sensory motor peripheral neuropathy, but new mild left S1 radiculopathy.  Had a recheck Neurology visit 07/2022 and elected to monitor symptoms.    2)CKD 3, renal cyst, Alports synd  On Na Bicarb.  Abd/pelvic CT 2012 left renal cyst 5 cm.  Renal sono 03/2017, exophytic cyst x 2 largest at 8 cm.  24 hr urine pro 288, cr cl 49 in 03/2018.   Urine microalbumin ratio 1115 in 04/2019, 1459 in 09/2019, 716 in 02/2020, 429 in 08/2020, 548 in 03/2022.  BUN/Creat 28/1.46 in 05/2016. eGFR 38, 36/1.73 in 2016-12-01.  21/1.77 in 09/2017, eGFR 30, K 5.6. Was instructed to cut Lisinopril to 20 mg.  22/1.38 in 09/2019,  24/1.61/33 in 08/2020,  34/1.52/37 in  09/2021,  28/2.08/25 in 03/2022 while ill, but improved to  20/1.45/39.in 04/2022.  CBC H/H 13.5/40.8 in 09/2021, but 11.6/35.1 in 03/2022.  PTH 70 in 09/2021, 82 in 03/2022.  Saw Dr. Tereso Newcomer then Dr. Lane Hacker 03/2022, note and BW result reviewed.     2)Hyperuricemia, kidney stone  On Allopurinol 200 ([employer] not covered by ins).  Uric acid: 4.7 in 12/2017, 3.9 in 03/2018, 5.1 in 01/2021.    3)Hypertension:  On Lisinopril 5 mg (reduced gradually from 20 mg).   Cartia XT 120 held by Dr. Tereso Newcomer. Off Amlodipine.  EST in 02/2015 nl.   Echo in 01/2015 showed mild LVH and tr tricuspid regurg and mild aortic valve sclerosis without stenosis  Home BP: jn good ranges.    4)Hyperlipidemia, fatty liver  On Zetia 10, Crestor 20, Lovaza 2 gm. Off Simvastatin 60.  Lipids: 198/164/50/115 in 05/2022, 200/199/47/118 in 05/2021, 232/124/61/149 in 02/2020, 185/148/54/101 in December 01, 2017, 214/212/47/125 in December 01, 2016.  LFT nl 05/2022.    5)Hypothyroidism:  Currently taking 50 mcg of brand name Synthroid daily.  TSH in a good range 09/2021.  Sono 12/2017, nodular thyroid without change.    6)IFG  A1C 6.0 in 12-01-2016, 5.8 in  03/2018, 6.4 in 09/2021, 5.7 in 05/2022.    7)GERD, Barrett's since 2013  Back on Prevacid, off Nexium due to nausea., Pepcid PRN.  EGD 04/2018 Dr. Lilia Pro, no Barretts, but + esophagitis despite Prevacid.  Update EGD 04/2020, Dr. Yvonna Alanis, no Barretts, but [facility].  GI visit 06/2021.  Symptoms: none as long as she is on med and stays on diet. Na bicarb helping.    8)OSA  mod non-positional OSA per home testing 01/2018. Was referred for titration study, but she can't keep CPAP on.    9)Exercise induced asthma, allergy  Xyzal, Flonase, Flovent PRN, Singulair.    10) Migraine   Took Imitrex or Relpax but had severe fatigue from taking them.   Nurtec rx was given for a trial. Doing well on it.    11) Osteopenia  DEXA T score -1.8 fem neck in 09/2019, -1.7 in 05/2017, -1.5 in 2011.  Vit D 41.7 in 10/2018.    11) MSK pain  -Left shoulder rotator cuff tendinopathy  12/2019. Saw Sports Med and had inj. Had PT.   -Left lateral knee and hip pain   -Recurring tailbone pain. Was referred to PT  -S/p left foot neuroma and right 1st MTP fx, saw Dr. Kathlynn Grate 04/2021.    12) BMI>30  Weight change: 10 pounds loss, was 213 in 12/2019.        The following sections were reviewed this encounter by the provider:        Review of Systems   Constitutional:  Negative for unexpected weight change.   Eyes:  Negative for visual disturbance.   Respiratory:  Negative for shortness of breath.    Cardiovascular:  Negative for chest pain, palpitations and leg swelling.   Gastrointestinal:  Negative for abdominal pain, constipation and diarrhea.   Genitourinary:  Negative for dysuria and frequency.   Musculoskeletal:  Positive for arthralgias. Negative for myalgias.          There were no vitals taken for this visit.    Objective:     Physical Exam  Constitutional:       General: She is not in acute distress.     Appearance: Normal appearance. She is not ill-appearing.   HENT:      Head: Normocephalic and atraumatic.      Nose: Nose normal.   Eyes:      Extraocular Movements: Extraocular movements intact.      Conjunctiva/sclera: Conjunctivae normal.   Neck:      Thyroid: No thyroid mass or thyromegaly.      Vascular: No carotid bruit.   Cardiovascular:      Rate and Rhythm: Regular rhythm.      Heart sounds: Murmur heard.   Pulmonary:      Effort: Pulmonary effort is normal. No respiratory distress.   Musculoskeletal:         General: Normal range of motion.   Lymphadenopathy:      Cervical: No cervical adenopathy.   Neurological:      General: No focal deficit present.      Mental Status: She is alert.      Gait: Gait normal.   Psychiatric:         Mood and Affect: Mood normal.         Behavior: Behavior normal.        Assessment:     There are no diagnoses linked to this encounter.          Plan:  Trial of Wegovey.    Please continue working on quality low fat low sugar diet as well as regular  exercises. Will provide a hand-out for leg exercise.    Continue Lisinopril at 5 mg daily and monitor BP at home.  Desirable range of BP is 110/70-135/85  Please call if consistently out of range.    Continue Rosuvastatin 20 mg daily.    Continue Singulair for asthma management.    Please have this blood test done after 8 hours fasting in August/September.  You may drink water and take your medications.    DEXA due.    Review of outside records and visit today took place over 45 min.    Taneal Sonntag Paschal Dopp, MD

## 2022-09-06 ENCOUNTER — Encounter (INDEPENDENT_AMBULATORY_CARE_PROVIDER_SITE_OTHER): Payer: Self-pay | Admitting: Family Medicine

## 2022-09-06 ENCOUNTER — Ambulatory Visit (INDEPENDENT_AMBULATORY_CARE_PROVIDER_SITE_OTHER): Payer: BLUE CROSS/BLUE SHIELD | Admitting: Family Medicine

## 2022-09-06 VITALS — BP 130/78 | HR 78 | Temp 98.8°F | Resp 18 | Ht 69.5 in | Wt 198.6 lb

## 2022-09-06 DIAGNOSIS — N1831 Chronic kidney disease, stage 3a: Secondary | ICD-10-CM

## 2022-09-06 DIAGNOSIS — I10 Essential (primary) hypertension: Secondary | ICD-10-CM

## 2022-09-06 DIAGNOSIS — K760 Fatty (change of) liver, not elsewhere classified: Secondary | ICD-10-CM

## 2022-09-06 DIAGNOSIS — Q8781 Alport syndrome: Secondary | ICD-10-CM

## 2022-09-06 DIAGNOSIS — M5418 Radiculopathy, sacral and sacrococcygeal region: Secondary | ICD-10-CM

## 2022-09-06 DIAGNOSIS — J4520 Mild intermittent asthma, uncomplicated: Secondary | ICD-10-CM

## 2022-09-06 DIAGNOSIS — E038 Other specified hypothyroidism: Secondary | ICD-10-CM

## 2022-09-06 DIAGNOSIS — J3089 Other allergic rhinitis: Secondary | ICD-10-CM

## 2022-09-06 DIAGNOSIS — E7849 Other hyperlipidemia: Secondary | ICD-10-CM

## 2022-09-06 DIAGNOSIS — M8589 Other specified disorders of bone density and structure, multiple sites: Secondary | ICD-10-CM

## 2022-09-06 DIAGNOSIS — E669 Obesity, unspecified: Secondary | ICD-10-CM

## 2022-09-06 DIAGNOSIS — R7301 Impaired fasting glucose: Secondary | ICD-10-CM

## 2022-09-06 DIAGNOSIS — J0100 Acute maxillary sinusitis, unspecified: Secondary | ICD-10-CM

## 2022-09-06 DIAGNOSIS — Z1231 Encounter for screening mammogram for malignant neoplasm of breast: Secondary | ICD-10-CM

## 2022-09-06 DIAGNOSIS — J018 Other acute sinusitis: Secondary | ICD-10-CM

## 2022-09-06 DIAGNOSIS — E559 Vitamin D deficiency, unspecified: Secondary | ICD-10-CM

## 2022-09-06 DIAGNOSIS — E790 Hyperuricemia without signs of inflammatory arthritis and tophaceous disease: Secondary | ICD-10-CM

## 2022-09-06 MED ORDER — FLUTICASONE PROPIONATE HFA 44 MCG/ACT IN AERO
2.0000 | INHALATION_SPRAY | Freq: Two times a day (BID) | RESPIRATORY_TRACT | 3 refills | Status: DC
Start: 2022-09-06 — End: 2022-09-10

## 2022-09-06 MED ORDER — SYNTHROID 50 MCG PO TABS
ORAL_TABLET | ORAL | 3 refills | Status: DC
Start: 2022-09-06 — End: 2023-07-24

## 2022-09-06 MED ORDER — LEVOCETIRIZINE DIHYDROCHLORIDE 5 MG PO TABS
5.0000 mg | ORAL_TABLET | Freq: Every evening | ORAL | 3 refills | Status: DC
Start: 2022-09-06 — End: 2023-07-24

## 2022-09-06 MED ORDER — EZETIMIBE 10 MG PO TABS
10.0000 mg | ORAL_TABLET | Freq: Every day | ORAL | 3 refills | Status: DC
Start: 2022-09-06 — End: 2023-07-24

## 2022-09-06 MED ORDER — SEMAGLUTIDE-WEIGHT MANAGEMENT 0.25 MG/0.5ML SC SOAJ
0.2500 mg | SUBCUTANEOUS | 0 refills | Status: DC
Start: 2022-09-06 — End: 2022-10-12

## 2022-09-06 MED ORDER — LISINOPRIL 5 MG PO TABS
5.0000 mg | ORAL_TABLET | Freq: Every day | ORAL | 3 refills | Status: DC
Start: 2022-09-06 — End: 2023-07-24

## 2022-09-06 MED ORDER — ALLOPURINOL 100 MG PO TABS
ORAL_TABLET | ORAL | 3 refills | Status: DC
Start: 2022-09-06 — End: 2023-07-24

## 2022-09-06 MED ORDER — DOXYCYCLINE HYCLATE 100 MG PO CAPS
100.0000 mg | ORAL_CAPSULE | Freq: Two times a day (BID) | ORAL | 0 refills | Status: AC
Start: 2022-09-06 — End: 2022-09-16

## 2022-09-06 NOTE — Patient Instructions (Addendum)
Treat with antibiotic.  Please let me know if not improving after one week.    Re-trial of Wegovey.    Please continue working on quality low fat low sugar diet as well as regular exercises.     Continue Lisinopril at 5 mg daily and monitor BP at home.  Desirable range of BP is 110/70-135/85  Please call if consistently out of range.    Continue Rosuvastatin 20 mg daily.    Continue Singulair for asthma management.    Please have this blood test done in January or when better.  Fasting is not necessary.    DEXA due.

## 2022-09-07 ENCOUNTER — Encounter (INDEPENDENT_AMBULATORY_CARE_PROVIDER_SITE_OTHER): Payer: Self-pay | Admitting: Family Medicine

## 2022-09-10 ENCOUNTER — Other Ambulatory Visit (INDEPENDENT_AMBULATORY_CARE_PROVIDER_SITE_OTHER): Payer: Self-pay | Admitting: Family Medicine

## 2022-09-10 DIAGNOSIS — J4520 Mild intermittent asthma, uncomplicated: Secondary | ICD-10-CM

## 2022-09-10 MED ORDER — BECLOMETHASONE DIPROP HFA 40 MCG/ACT IN AERB
2.0000 | INHALATION_SPRAY | Freq: Two times a day (BID) | RESPIRATORY_TRACT | 3 refills | Status: DC
Start: 2022-09-10 — End: 2023-03-19

## 2022-09-12 ENCOUNTER — Encounter (INDEPENDENT_AMBULATORY_CARE_PROVIDER_SITE_OTHER): Payer: Self-pay | Admitting: Family Medicine

## 2022-09-14 ENCOUNTER — Telehealth: Payer: Self-pay

## 2022-09-14 ENCOUNTER — Encounter (INDEPENDENT_AMBULATORY_CARE_PROVIDER_SITE_OTHER): Payer: Self-pay | Admitting: Family Medicine

## 2022-09-14 NOTE — Telephone Encounter (Signed)
A PA has been initiated through [employer] (Key: [id number]) - [id number]  HYQMVH 0.25MG /0.5ML auto-injectors    Chart Reviewed, Clinical criteria investigated, and Eligibility investigated    FEP - WEGOVY (06/07/2022)    What is the patient's age in years?  71    Is this medication being used for chronic weight management?  YES    Will the patient use this medication in combination with lifestyle changes and a reduced calorie diet?  YES (Patient reports maintaining an [phone] calorie per day diet and increased activity)    Has the patient been on this medication continuously for the last 4 months excluding samples?  NO (been on Saxenda)    Please confirm the patient's age.  79 years of age or older    What is the patient's body mass index (BMI) in kilograms per square meter (kg/m2)?  Between 27 kg/m2 and 29.9 kg/m2  28.91 as of 09/06/22 90.1kg    Does the patient have at least one weight related comorbid condition, such as type 2 diabetes mellitus, dyslipidemia, hypertension, coronary heart disease, or sleep apnea?  YES (HTN, Sleep apnea, dyslipidemia)    Is the patient currently using another glucagon-like peptide-1 (GLP-1) receptor agonist? If yes, is this a change in therapy or a request for an additional GLP-1? Other GLP-1 receptor agonists examples include Mounjaro, Rybelsus, Soliqua, or Xultophy etc.  No, not using another GLP-1 (Saxenda d/c due to shortage)    Will this medication be used in combination with another Prior Authorization (PA) medication for weight loss? If yes, please specify the medication.  No Saxenda d/c due to shortage    Will the patient need more than 12 single-dose pens every 84 days?  No      Patients demographics submitted. Prior Authorization has been submitted and approved based clinical criteria.   The Prior Authorization request has been approved for Wegovy (semaglutide). The authorization is valid for 08/15/2022 through 03/13/2023    PA process complete,  Patient notified via mychart message, and Provider notified      Eliot Ford, PharmD  Pharmacy Clinical Specialist  [facility], Ambulatory Care Management Team  [address]  [place], [place] [address]  [place] [phone]

## 2022-09-25 ENCOUNTER — Encounter (INDEPENDENT_AMBULATORY_CARE_PROVIDER_SITE_OTHER): Payer: Self-pay | Admitting: Family Medicine

## 2022-09-26 ENCOUNTER — Other Ambulatory Visit (INDEPENDENT_AMBULATORY_CARE_PROVIDER_SITE_OTHER): Payer: Self-pay | Admitting: Family Medicine

## 2022-09-26 ENCOUNTER — Other Ambulatory Visit (FREE_STANDING_LABORATORY_FACILITY): Payer: BLUE CROSS/BLUE SHIELD

## 2022-09-26 DIAGNOSIS — E669 Obesity, unspecified: Secondary | ICD-10-CM

## 2022-09-26 DIAGNOSIS — N1831 Chronic kidney disease, stage 3a: Secondary | ICD-10-CM

## 2022-09-26 DIAGNOSIS — E790 Hyperuricemia without signs of inflammatory arthritis and tophaceous disease: Secondary | ICD-10-CM

## 2022-09-26 DIAGNOSIS — E7849 Other hyperlipidemia: Secondary | ICD-10-CM

## 2022-09-26 DIAGNOSIS — K760 Fatty (change of) liver, not elsewhere classified: Secondary | ICD-10-CM

## 2022-09-26 DIAGNOSIS — R7301 Impaired fasting glucose: Secondary | ICD-10-CM

## 2022-09-26 DIAGNOSIS — E559 Vitamin D deficiency, unspecified: Secondary | ICD-10-CM

## 2022-09-26 DIAGNOSIS — I10 Essential (primary) hypertension: Secondary | ICD-10-CM

## 2022-09-26 DIAGNOSIS — J018 Other acute sinusitis: Secondary | ICD-10-CM

## 2022-09-26 DIAGNOSIS — E038 Other specified hypothyroidism: Secondary | ICD-10-CM

## 2022-09-26 LAB — CBC AND DIFFERENTIAL
Absolute NRBC: 0 10*3/uL (ref 0.00–0.00)
Basophils Absolute Automated: 0.09 10*3/uL — ABNORMAL HIGH (ref 0.00–0.08)
Basophils Automated: 1.1 %
Eosinophils Absolute Automated: 0.37 10*3/uL (ref 0.00–0.44)
Eosinophils Automated: 4.4 %
Hematocrit: 40 % (ref 34.7–43.7)
Hgb: 12.7 g/dL (ref 11.4–14.8)
Immature Granulocytes Absolute: 0.01 10*3/uL (ref 0.00–0.07)
Immature Granulocytes: 0.1 %
Instrument Absolute Neutrophil Count: 4.7 10*3/uL (ref 1.10–6.33)
Lymphocytes Absolute Automated: 2.72 10*3/uL (ref 0.42–3.22)
Lymphocytes Automated: 32.4 %
MCH: 30.2 pg (ref 25.1–33.5)
MCHC: 31.8 g/dL (ref 31.5–35.8)
MCV: 95.2 fL (ref 78.0–96.0)
MPV: 12.7 fL — ABNORMAL HIGH (ref 8.9–12.5)
Monocytes Absolute Automated: 0.51 10*3/uL (ref 0.21–0.85)
Monocytes: 6.1 %
Neutrophils Absolute: 4.7 10*3/uL (ref 1.10–6.33)
Neutrophils: 55.9 %
Nucleated RBC: 0 /100 WBC (ref 0.0–0.0)
Platelets: 219 10*3/uL (ref 142–346)
RBC: 4.2 10*6/uL (ref 3.90–5.10)
RDW: 15 % (ref 11–15)
WBC: 8.4 10*3/uL (ref 3.10–9.50)

## 2022-09-26 LAB — BASIC METABOLIC PANEL
Anion Gap: 10 (ref 5.0–15.0)
BUN: 20 mg/dL (ref 7.0–21.0)
CO2: 23 mEq/L (ref 17–29)
Calcium: 9.5 mg/dL (ref 7.9–10.2)
Chloride: 107 mEq/L (ref 99–111)
Creatinine: 1.4 mg/dL — ABNORMAL HIGH (ref 0.4–1.0)
Glucose: 132 mg/dL — ABNORMAL HIGH (ref 70–100)
Potassium: 4.2 mEq/L (ref 3.5–5.3)
Sodium: 140 mEq/L (ref 135–145)
eGFR: 40.4 mL/min/{1.73_m2} — AB (ref >=60)

## 2022-09-26 LAB — TSH: TSH: 0.96 u[IU]/mL (ref 0.35–4.94)

## 2022-09-26 LAB — HEMOLYSIS INDEX: Hemolysis Index: 3 Index (ref 0–24)

## 2022-09-26 LAB — VITAMIN D,25 OH,TOTAL: Vitamin D, 25 OH, Total: 46 ng/mL (ref 30–100)

## 2022-09-26 MED ORDER — SEMAGLUTIDE-WEIGHT MANAGEMENT 0.25 MG/0.5ML SC SOAJ
0.2500 mg | SUBCUTANEOUS | 1 refills | Status: DC
Start: 2022-09-26 — End: 2022-09-26

## 2022-09-26 NOTE — Progress Notes (Signed)
Kidney function is stable.  Blood counts, thyroid, Vitamin D are in normal ranges.

## 2022-09-29 ENCOUNTER — Telehealth (INDEPENDENT_AMBULATORY_CARE_PROVIDER_SITE_OTHER): Payer: Self-pay | Admitting: Family Medicine

## 2022-09-29 ENCOUNTER — Encounter (INDEPENDENT_AMBULATORY_CARE_PROVIDER_SITE_OTHER): Payer: Self-pay | Admitting: Family Medicine

## 2022-09-29 NOTE — Telephone Encounter (Signed)
-  Prior Auth for: Saxenda     -Received Reminder Form by fax: 09/13/22     -Form sent by: CoverMyMeds     -CoverMyMeds:  Created on: 09/13/22  Status: NEW  KEY: [id number]    Received notice that PA Approval was going to expire and PA was started by CoverMyMeds?

## 2022-10-03 NOTE — Telephone Encounter (Signed)
I'm confused.  I thought we were working on [employer].

## 2022-10-04 ENCOUNTER — Other Ambulatory Visit (INDEPENDENT_AMBULATORY_CARE_PROVIDER_SITE_OTHER): Payer: Self-pay | Admitting: Family Medicine

## 2022-10-04 DIAGNOSIS — E038 Other specified hypothyroidism: Secondary | ICD-10-CM

## 2022-10-04 NOTE — Telephone Encounter (Signed)
Pt is only attempting to use Wegovy now, needs [employer] PA.

## 2022-10-04 NOTE — Telephone Encounter (Signed)
See task 12/26, she may have to pick one, despite shortages:      Hi Ms Victoria Fields plan will only approve 1 PA at a time for these types of medication.  I will process the PA for the [facility] (which will in turn stop coverage of the Saxenda) and update you with the status when I receive it.    Thanks!  [employer]

## 2022-10-09 NOTE — Telephone Encounter (Signed)
PA Faxed via [employer] (Key: [id number])

## 2022-10-11 ENCOUNTER — Other Ambulatory Visit (INDEPENDENT_AMBULATORY_CARE_PROVIDER_SITE_OTHER): Payer: Self-pay | Admitting: Family Medicine

## 2022-10-11 DIAGNOSIS — E7849 Other hyperlipidemia: Secondary | ICD-10-CM

## 2022-10-11 DIAGNOSIS — R7301 Impaired fasting glucose: Secondary | ICD-10-CM

## 2022-10-11 DIAGNOSIS — E669 Obesity, unspecified: Secondary | ICD-10-CM

## 2022-10-11 DIAGNOSIS — I10 Essential (primary) hypertension: Secondary | ICD-10-CM

## 2022-10-11 DIAGNOSIS — K760 Fatty (change of) liver, not elsewhere classified: Secondary | ICD-10-CM

## 2022-10-11 MED ORDER — SEMAGLUTIDE-WEIGHT MANAGEMENT 0.25 MG/0.5ML SC SOAJ
0.5000 mg | SUBCUTANEOUS | 0 refills | Status: DC
Start: 2022-10-11 — End: 2022-10-12

## 2022-10-11 MED ORDER — SEMAGLUTIDE-WEIGHT MANAGEMENT 0.5 MG/0.5ML SC SOAJ
0.5000 mg | SUBCUTANEOUS | 1 refills | Status: DC
Start: 2022-10-11 — End: 2023-03-19

## 2022-10-11 NOTE — Progress Notes (Signed)
Wegov 0.5

## 2022-10-11 NOTE — Telephone Encounter (Signed)
Response N/A:    "This is to inform you that we have recived the medical information that office has submitted for the above member's Weogvy (semaglutide). We have reviewed the request and based on the information submitted NO Prior Authorization is needed at this time for [facility] (semaglutide)"    Pt notified via [employer]

## 2022-10-12 ENCOUNTER — Other Ambulatory Visit (INDEPENDENT_AMBULATORY_CARE_PROVIDER_SITE_OTHER): Payer: Self-pay | Admitting: Family Medicine

## 2022-10-12 ENCOUNTER — Encounter (INDEPENDENT_AMBULATORY_CARE_PROVIDER_SITE_OTHER): Payer: Self-pay | Admitting: Family Medicine

## 2022-10-12 DIAGNOSIS — E7849 Other hyperlipidemia: Secondary | ICD-10-CM

## 2022-10-12 DIAGNOSIS — K760 Fatty (change of) liver, not elsewhere classified: Secondary | ICD-10-CM

## 2022-10-12 DIAGNOSIS — E669 Obesity, unspecified: Secondary | ICD-10-CM

## 2022-10-12 DIAGNOSIS — R7301 Impaired fasting glucose: Secondary | ICD-10-CM

## 2022-10-12 DIAGNOSIS — I10 Essential (primary) hypertension: Secondary | ICD-10-CM

## 2022-10-12 MED ORDER — TIRZEPATIDE 2.5 MG/0.5ML SC SOPN
2.5000 mg | PEN_INJECTOR | SUBCUTANEOUS | 1 refills | Status: DC
Start: 2022-10-12 — End: 2022-10-19

## 2022-10-19 ENCOUNTER — Other Ambulatory Visit (INDEPENDENT_AMBULATORY_CARE_PROVIDER_SITE_OTHER): Payer: Self-pay | Admitting: Family Medicine

## 2022-10-25 ENCOUNTER — Encounter (INDEPENDENT_AMBULATORY_CARE_PROVIDER_SITE_OTHER): Payer: Self-pay | Admitting: Family Medicine

## 2022-11-13 ENCOUNTER — Other Ambulatory Visit (INDEPENDENT_AMBULATORY_CARE_PROVIDER_SITE_OTHER): Payer: Self-pay | Admitting: Family Medicine

## 2022-11-15 NOTE — Progress Notes (Signed)
This mammogram showed no sign of breast cancer.

## 2023-01-03 ENCOUNTER — Encounter (INDEPENDENT_AMBULATORY_CARE_PROVIDER_SITE_OTHER): Payer: Self-pay | Admitting: Family Medicine

## 2023-01-04 ENCOUNTER — Other Ambulatory Visit: Payer: Self-pay | Admitting: Family Medicine

## 2023-01-08 NOTE — Progress Notes (Signed)
Bone density is decreased but not to the extent of Osteoporosis.  Will continue to monitor.

## 2023-01-11 ENCOUNTER — Other Ambulatory Visit (INDEPENDENT_AMBULATORY_CARE_PROVIDER_SITE_OTHER): Payer: Self-pay | Admitting: Family Medicine

## 2023-01-11 DIAGNOSIS — E7849 Other hyperlipidemia: Secondary | ICD-10-CM

## 2023-01-11 DIAGNOSIS — J4520 Mild intermittent asthma, uncomplicated: Secondary | ICD-10-CM

## 2023-03-10 NOTE — Progress Notes (Signed)
Subjective:      Patient ID: Victoria Fields is a 72 y.o. female     Chief Complaint   Patient presents with    Hypertension     Lightheaded with SBP readings in the 80s a couple times. Didn't correlate with hot days, doesn't think dehydrated.     Other     Refills, having epidural steroid injections for back pain.     Hyperlipidemia    Hypothyroidism        She is seen for well visit and ch care recheck.  Was seen by me last in 08/2022.    Started on Wegovy in 01/2022 and did well, but was not able to continue due to shortage despite trying rx's for multiple GLP-1's.  Had lost 15 pounds from initial Wegovy and maintained but would like to lose more..    Smoking: in college.  Alcohol: not much.  Diet: balanced  Exercise: regualrly  PAP with HPV was done in 02/2017.  Mammo 2022/10/27, [facility].  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 Yvonna Alanis.  HCV neg.  Immuniz: UTD  FH of colon, breast, ovary, skin CA: no.  FH of DM, heart d: brother passed away with MI in 28-Oct-2015. He had PKD and was on dialysis. Daughter with [place] synd.    For ch care:  1)Periph neuropathy  On Lyrica 50 tid, and notes improvement.  Gabapentin caused excessive lethargy.  Originally noted when she had bleeding on her foot that she did not feel.  NCS 01/2020 showed mildly progressed sensory and motor neuropathy bilat LE's.  Labs 01/2020: low B12 at 281, now on suppl.  Homocysteine 16 and mildly elevated.  SPEP showing hypogammaglobulinemia. ANA neg.  B12 1216 in 08/2020, but homocysteine still at 17.4.  NCS 07/2022 showed same mild axonal sensory motor peripheral neuropathy, but new mild left S1 radiculopathy.  MRI L-spine 08/2022, L4/5 spondylolisthesis.  Neuro, Dr. Jovita Gamma, visit 11/2022 reviewed.  Pain Management visit 01/2023, had inj.    2)CKD 3, renal cyst, [place] synd  On Na Bicarb. Marcelline Deist was started in 10/27/22 per Nephro.  Abd/pelvic CT 2012 left renal cyst 5 cm.  Renal sono 03/2017, exophytic cyst x 2 largest at 8 cm.  24 hr urine pro 288, cr cl 49 in  03/2018.   Urine microalbumin ratio 1115 in 04/2019, 1459 in 09/2019, 716 in 02/2020, 429 in 08/2020, 548 in 03/2022, 803 in 09/2022.  BUN/Creat 28/1.46 in 05/2016.  21/1.77/30 in 09/2017, was instructed to cut Lisinopril to 20 mg.  24/1.61/33 in 08/2020,  34/1.52/37 in 09/2021,  28/2.08/25 in 03/2022 while ill,  20/1.4/40 in 09/2022.  CBC H/H 13.5/40.8 in 09/2021, 11.6/35.1 in 03/2022, 12.7/40 in 09/2022.  PTH 70 in 09/2021, 82 in 03/2022, 49 in 09/2022.  Saw Dr. Tereso Newcomer then Dr. Lane Hacker 09/2022, note and BW result reviewed.     2)Hyperuricemia, kidney stone  On Allopurinol 200 (Uloric not covered by ins).  Uric acid: 4.7 in 12/2017, 3.9 in 03/2018, 5.1 in 01/2021.    3)Hypertension:  On Lisinopril 5 mg (reduced gradually from 20 mg).   Cartia XT 120 held by Dr. Tereso Newcomer. Off Amlodipine.  EST in 02/2015 nl.   Echo in 01/2015 showed mild LVH and tr tricuspid regurg and mild aortic valve sclerosis without stenosis  Home BP: SBP varies but mostly 120's.    4)Hyperlipidemia, fatty liver  On Zetia 10, Crestor 20, Lovaza 2 gm. Off Simvastatin 60.  Lipids: 198/164/50/115 in 05/2022, 200/199/47/118 in 05/2021, 232/124/61/149 in 02/2020, 185/148/54/101 in  10/2017, 214/212/47/125 in 10/2016.  LFT nl 05/2022.    5)Hypothyroidism:  Currently taking 50 mcg of brand name Synthroid daily.  TSH in a good range 09/2022.  Sono 12/2017, nodular thyroid without change.    6)IFG  A1C 6.0 in 10/2016, 5.8 in 03/2018, 6.4 in 09/2021, 5.7 in 05/2022.    7)GERD, Barrett's since 2013  Back on Prevacid, off Nexium due to nausea., Pepcid PRN.  EGD 04/2018 Dr. Lilia Pro, no Barretts, but + esophagitis despite Prevacid.  Update EGD 04/2020, Dr. Yvonna Alanis, no Barretts, but [facility].  GI visit 06/2021.  Symptoms: increased.  Will see GI.    8)OSA  mod non-positional OSA per home testing 01/2018. Was referred for titration study, but she can't keep CPAP on.    9)Exercise induced asthma, allergy  Xyzal, Flonase, Flovent PRN, Singulair.  Was given Qvar in place of Flovent.  Has bad taste in mouth,  difficult to use.  Was seen on 08/2022 for recurring vertigo.  Meclizine helped.  Ill with sinus and resp symptoms x 2 in 2023.    10) Migraine  Took Imitrex or Relpax but had severe fatigue from taking them.   Nurtec works well.. Was doing well on it but had a flare a month ago.    11) Osteopenia  DEXA T score -2.0 fem neck in 12/2022, -1.8 in 09/2019, -1.7 in 05/2017, -1.5 in 2011.  Vit D 46 in 09/2022.    11) MSK pain  -Left shoulder rotator cuff tendinopathy 12/2019. Saw Sports Med and had inj. Had PT.   -Left lateral knee and hip pain   -Recurring tailbone pain. Was referred to PT  -S/p left foot neuroma and right 1st MTP fx, saw Dr. Kathlynn Grate 04/2021.    12) BMI>30  Weight change: 10 pounds loss, was 213 in 12/2019.  Wt Readings from Last 3 Encounters:  03/19/23 : 90.7 kg (200 lb)  09/06/22 : 90.1 kg (198 lb 9.6 oz)  08/29/22 : 90.7 kg (200 lb)            The following sections were reviewed this encounter by the provider:   Tobacco  Allergies  Meds  Problems  Med Hx  Surg Hx  Fam Hx         Review of Systems       BP 144/83   Pulse 71   Temp 98.9 F (37.2 C)   Wt 90.7 kg (200 lb)   BMI 29.11 kg/m     Objective:     Physical Exam  Constitutional:       General: She is not in acute distress.     Appearance: Normal appearance. She is obese. She is not ill-appearing.   HENT:      Head: Normocephalic and atraumatic.      Nose: Nose normal.   Eyes:      Extraocular Movements: Extraocular movements intact.      Conjunctiva/sclera: Conjunctivae normal.   Pulmonary:      Effort: Pulmonary effort is normal. No respiratory distress.   Musculoskeletal:         General: Normal range of motion.   Neurological:      General: No focal deficit present.      Mental Status: She is alert.      Gait: Gait normal.   Psychiatric:         Mood and Affect: Mood normal.         Behavior: Behavior normal.  Assessment:     1. Well adult exam    2. Benign essential hypertension  - Referral to Cardiology (EXTERNAL); Future  -  Comprehensive Metabolic Panel; Future    3. Other hyperlipidemia  - omega-3 acid ethyl esters (LOVAZA) 1 g capsule; Take 1 capsule (1 g) by mouth 2 (two) times daily  Dispense: 180 capsule; Refill: 3  - rosuvastatin (CRESTOR) 20 MG tablet; Take 1 tablet (20 mg) by mouth daily  Dispense: 90 tablet; Refill: 3  - Referral to Cardiology (EXTERNAL); Future  - Lipid Panel; Future  - Comprehensive Metabolic Panel; Future    4. Impaired fasting glucose  - POCT Hemoglobin A1C  - Hemoglobin A1C; Future    5. 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 Mild intermittent reactive airway disease without complication  - montelukast (SINGULAIR) 10 MG tablet; Take 1 tablet (10 mg) by mouth nightly  Dispense: 90 tablet; Refill: 3  - albuterol sulfate HFA (Ventolin HFA) 108 (90 Base) MCG/ACT inhaler; Inhale 2 puffs into the lungs every 4 (four) hours as needed for Wheezing or Shortness of Breath  Dispense: 1 each; Refill: 3  - fluticasone (Flovent HFA) 44 MCG/ACT inhaler; Inhale 2 puffs into the lungs 2 (two) times daily  Dispense: 1 each; Refill: 3    8. Non-seasonal allergic rhinitis due to other allergic trigger  - fluticasone (FLONASE) 50 MCG/ACT nasal spray; 2 sprays by Nasal route daily  Dispense: 48 g; Refill: 3    9. Obstructive sleep apnea syndrome    10. Hyperuricemia    11. Other specified hypothyroidism    12. Polyneuropathy associated with underlying disease    13. Other migraine without status migrainosus, not intractable    14. Osteopenia of multiple sites    15. Lumbar radiculopathy    16. Obesity with body mass index 30 or greater    Plan:     Will work on re-appealing for GLP-1.    Please continue working on quality low fat low sugar diet as well as regular exercises.     Continue Lisinopril at 5 mg daily and monitor BP at home.  Desirable range of BP is 110/70-135/85  Please call if consistently out of range.    Continue Rosuvastatin 20 mg daily.    Continue Singulair for  asthma management.    Please have this blood test done after 8 hours fasting in late October.  You may drink water and take your medications.    Review of outside records and visit today took place over 45 min.outside of wellness.    Jannely Henthorn Paschal Dopp, MD

## 2023-03-19 ENCOUNTER — Other Ambulatory Visit (INDEPENDENT_AMBULATORY_CARE_PROVIDER_SITE_OTHER): Payer: Self-pay | Admitting: Family Medicine

## 2023-03-19 ENCOUNTER — Encounter (INDEPENDENT_AMBULATORY_CARE_PROVIDER_SITE_OTHER): Payer: Self-pay | Admitting: Family Medicine

## 2023-03-19 ENCOUNTER — Telehealth (INDEPENDENT_AMBULATORY_CARE_PROVIDER_SITE_OTHER): Payer: Self-pay | Admitting: Family Medicine

## 2023-03-19 ENCOUNTER — Ambulatory Visit (INDEPENDENT_AMBULATORY_CARE_PROVIDER_SITE_OTHER): Payer: BLUE CROSS/BLUE SHIELD | Admitting: Family Medicine

## 2023-03-19 VITALS — BP 144/83 | HR 71 | Temp 98.9°F | Wt 200.0 lb

## 2023-03-19 DIAGNOSIS — R7301 Impaired fasting glucose: Secondary | ICD-10-CM

## 2023-03-19 DIAGNOSIS — M8589 Other specified disorders of bone density and structure, multiple sites: Secondary | ICD-10-CM

## 2023-03-19 DIAGNOSIS — I10 Essential (primary) hypertension: Secondary | ICD-10-CM

## 2023-03-19 DIAGNOSIS — Z0000 Encounter for general adult medical examination without abnormal findings: Secondary | ICD-10-CM

## 2023-03-19 DIAGNOSIS — N1831 Chronic kidney disease, stage 3a: Secondary | ICD-10-CM

## 2023-03-19 DIAGNOSIS — E790 Hyperuricemia without signs of inflammatory arthritis and tophaceous disease: Secondary | ICD-10-CM

## 2023-03-19 DIAGNOSIS — G4733 Obstructive sleep apnea (adult) (pediatric): Secondary | ICD-10-CM

## 2023-03-19 DIAGNOSIS — Q8781 Alport syndrome: Secondary | ICD-10-CM

## 2023-03-19 DIAGNOSIS — M5416 Radiculopathy, lumbar region: Secondary | ICD-10-CM

## 2023-03-19 DIAGNOSIS — E7849 Other hyperlipidemia: Secondary | ICD-10-CM

## 2023-03-19 DIAGNOSIS — E669 Obesity, unspecified: Secondary | ICD-10-CM

## 2023-03-19 DIAGNOSIS — G63 Polyneuropathy in diseases classified elsewhere: Secondary | ICD-10-CM

## 2023-03-19 DIAGNOSIS — J4520 Mild intermittent asthma, uncomplicated: Secondary | ICD-10-CM

## 2023-03-19 DIAGNOSIS — E038 Other specified hypothyroidism: Secondary | ICD-10-CM

## 2023-03-19 DIAGNOSIS — G43809 Other migraine, not intractable, without status migrainosus: Secondary | ICD-10-CM

## 2023-03-19 DIAGNOSIS — J42 Unspecified chronic bronchitis: Secondary | ICD-10-CM | POA: Insufficient documentation

## 2023-03-19 DIAGNOSIS — I129 Hypertensive chronic kidney disease with stage 1 through stage 4 chronic kidney disease, or unspecified chronic kidney disease: Secondary | ICD-10-CM

## 2023-03-19 DIAGNOSIS — J3089 Other allergic rhinitis: Secondary | ICD-10-CM

## 2023-03-19 LAB — POCT HEMOGLOBIN A1C: POCT Hgb A1C: 6.3 % — AB (ref 3.9–5.9)

## 2023-03-19 MED ORDER — ROSUVASTATIN CALCIUM 20 MG PO TABS
20.0000 mg | ORAL_TABLET | Freq: Every day | ORAL | 3 refills | Status: DC
Start: 2023-03-19 — End: 2024-06-04

## 2023-03-19 MED ORDER — FLUTICASONE PROPIONATE 50 MCG/ACT NA SUSP
2.0000 | Freq: Every day | NASAL | 3 refills | Status: DC
Start: 2023-03-19 — End: 2024-06-13

## 2023-03-19 MED ORDER — FLUTICASONE PROPIONATE HFA 44 MCG/ACT IN AERO
2.0000 | INHALATION_SPRAY | Freq: Two times a day (BID) | RESPIRATORY_TRACT | 3 refills | Status: DC
Start: 2023-03-19 — End: 2023-09-27

## 2023-03-19 MED ORDER — MONTELUKAST SODIUM 10 MG PO TABS
10.0000 mg | ORAL_TABLET | Freq: Every evening | ORAL | 3 refills | Status: DC
Start: 2023-03-19 — End: 2024-06-04

## 2023-03-19 MED ORDER — OMEGA-3-ACID ETHYL ESTERS 1 G PO CAPS
1.0000 | ORAL_CAPSULE | Freq: Two times a day (BID) | ORAL | 3 refills | Status: DC
Start: 2023-03-19 — End: 2024-03-28

## 2023-03-19 MED ORDER — SEMAGLUTIDE-WEIGHT MANAGEMENT 0.25 MG/0.5ML SC SOAJ
0.2500 mg | SUBCUTANEOUS | 1 refills | Status: DC
Start: 2023-03-19 — End: 2023-04-14

## 2023-03-19 MED ORDER — ALBUTEROL SULFATE HFA 108 (90 BASE) MCG/ACT IN AERS
2.0000 | INHALATION_SPRAY | RESPIRATORY_TRACT | 3 refills | Status: AC | PRN
Start: 2023-03-19 — End: ?

## 2023-03-19 NOTE — Telephone Encounter (Signed)
We are still interested in GLP-1 for her.  Is there any that you'd recommend our trying to prescribe for her?

## 2023-03-19 NOTE — Telephone Encounter (Signed)
Ambulatory Clinical Pharmacy Consult Coverage Review    Referring Provider/Care Manager:  PCP    PCP:  Phillips Odor, MD    Primary Pharmacy Concern:  GLP1ra weight loss coverage review    Allergies:    Allergies   Allergen Reactions    Cephalosporins Anaphylaxis    Strawberry Extract Hives    Cefuroxime     Dust Mite Extract     Molds & Smuts     Niacin Itching    Pollen Extract     Tree Extract        Chart Reviewed for pertinent information:  Started on Wegovy in 01/2022 and did well, but was not able to continue due to shortage despite trying rx's for multiple GLP-1's. Had lost 15 pounds from initial Wegovy and maintained but would like to lose more.  PA expired 03/13/2023    Pharmacy Benefits Manager (as of 03/19/2023): FEP BCBS Standard    Benefits Review (as of 03/19/2023):   All weight loss medications require a PA for clinical criteria  Reginal Lutes is preferred  $90 for 1 month at Mail Order  $365 for 1 month at Retail    Zepbound is non preferred  $475 per month at retail only  Potential to achieve 50% more weight loss at max dose vs max dose of wegovy based on (head to head trials in non diabetics still in progress)    Assessment:  Weight Loss  Weight loss successful (>7% of starting weight)on GLP1ra and patient has maintain initial weight loss  Weight loss plateau common with all weight loss medication unless continuous changes are made to adjunct diet/exercise program    Recommendation:    Weight Loss  Re-evaluate life style modifications and weight goals before considering change in therapy.    Follow-Up:    Recommendations sent to pcp      ===================================================      Eliot Ford, PharmD  Pharmacy Clinical Specialist  [facility], Ambulatory Care Management Team  [address]  [place], [place] [address]  [place] [phone]

## 2023-03-19 NOTE — Patient Instructions (Signed)
Will work on re-appealing for GLP-1.    Please continue working on quality low fat low sugar diet as well as regular exercises.     Continue Lisinopril at 5 mg daily and monitor BP at home.  Desirable range of BP is 110/70-135/85  Please call if consistently out of range.    Continue Rosuvastatin 20 mg daily.    Continue Singulair for asthma management.    Please have this blood test done after 8 hours fasting in late October.  You may drink water and take your medications.

## 2023-03-19 NOTE — Telephone Encounter (Signed)
Please inform her that she may have less co-pay with Wegovy than Zepbound.  I [facility] rx to [facility] for now.  Not sure about the availability.

## 2023-03-20 NOTE — Telephone Encounter (Signed)
Pt.notified

## 2023-03-27 ENCOUNTER — Encounter (INDEPENDENT_AMBULATORY_CARE_PROVIDER_SITE_OTHER): Payer: Self-pay | Admitting: Family Medicine

## 2023-03-28 ENCOUNTER — Other Ambulatory Visit (INDEPENDENT_AMBULATORY_CARE_PROVIDER_SITE_OTHER): Payer: Self-pay | Admitting: Family Medicine

## 2023-03-28 DIAGNOSIS — R7301 Impaired fasting glucose: Secondary | ICD-10-CM

## 2023-03-28 DIAGNOSIS — E7849 Other hyperlipidemia: Secondary | ICD-10-CM

## 2023-03-28 DIAGNOSIS — E669 Obesity, unspecified: Secondary | ICD-10-CM

## 2023-03-28 DIAGNOSIS — N1831 Chronic kidney disease, stage 3a: Secondary | ICD-10-CM

## 2023-03-28 DIAGNOSIS — M5416 Radiculopathy, lumbar region: Secondary | ICD-10-CM

## 2023-03-28 DIAGNOSIS — G4733 Obstructive sleep apnea (adult) (pediatric): Secondary | ICD-10-CM

## 2023-03-28 DIAGNOSIS — I10 Essential (primary) hypertension: Secondary | ICD-10-CM

## 2023-03-28 NOTE — Telephone Encounter (Signed)
PA submitted (Key: [id number])

## 2023-03-29 NOTE — Progress Notes (Signed)
PA approved. Pt notified via mychart    "Authorization Expiration Date: September 24, 2023"

## 2023-03-30 NOTE — Telephone Encounter (Signed)
(  Key: [id number])

## 2023-04-03 ENCOUNTER — Encounter (INDEPENDENT_AMBULATORY_CARE_PROVIDER_SITE_OTHER): Payer: Self-pay | Admitting: Family Medicine

## 2023-04-13 ENCOUNTER — Encounter (INDEPENDENT_AMBULATORY_CARE_PROVIDER_SITE_OTHER): Payer: Self-pay | Admitting: Family Medicine

## 2023-04-14 ENCOUNTER — Other Ambulatory Visit (INDEPENDENT_AMBULATORY_CARE_PROVIDER_SITE_OTHER): Payer: Self-pay | Admitting: Family Medicine

## 2023-04-14 DIAGNOSIS — R7301 Impaired fasting glucose: Secondary | ICD-10-CM

## 2023-04-14 DIAGNOSIS — N1831 Chronic kidney disease, stage 3a: Secondary | ICD-10-CM

## 2023-04-14 DIAGNOSIS — E7849 Other hyperlipidemia: Secondary | ICD-10-CM

## 2023-04-14 DIAGNOSIS — M5416 Radiculopathy, lumbar region: Secondary | ICD-10-CM

## 2023-04-14 DIAGNOSIS — I10 Essential (primary) hypertension: Secondary | ICD-10-CM

## 2023-04-14 DIAGNOSIS — E669 Obesity, unspecified: Secondary | ICD-10-CM

## 2023-04-14 DIAGNOSIS — G4733 Obstructive sleep apnea (adult) (pediatric): Secondary | ICD-10-CM

## 2023-04-14 MED ORDER — SEMAGLUTIDE-WEIGHT MANAGEMENT 0.5 MG/0.5ML SC SOAJ
0.5000 mg | SUBCUTANEOUS | 1 refills | Status: DC
Start: 2023-04-14 — End: 2023-06-03

## 2023-05-01 ENCOUNTER — Encounter (INDEPENDENT_AMBULATORY_CARE_PROVIDER_SITE_OTHER): Payer: Self-pay | Admitting: Family Medicine

## 2023-06-06 ENCOUNTER — Encounter (INDEPENDENT_AMBULATORY_CARE_PROVIDER_SITE_OTHER): Payer: Self-pay | Admitting: Family Medicine

## 2023-06-07 ENCOUNTER — Encounter (INDEPENDENT_AMBULATORY_CARE_PROVIDER_SITE_OTHER): Payer: Self-pay | Admitting: Family Medicine

## 2023-06-09 ENCOUNTER — Other Ambulatory Visit (INDEPENDENT_AMBULATORY_CARE_PROVIDER_SITE_OTHER): Payer: Self-pay | Admitting: Family Medicine

## 2023-06-09 MED ORDER — SEMAGLUTIDE-WEIGHT MANAGEMENT 1 MG/0.5ML SC SOAJ
1.0000 mg | SUBCUTANEOUS | 0 refills | Status: DC
Start: 2023-06-09 — End: 2023-07-01

## 2023-06-25 ENCOUNTER — Encounter (INDEPENDENT_AMBULATORY_CARE_PROVIDER_SITE_OTHER): Payer: Self-pay | Admitting: Family Medicine

## 2023-06-25 ENCOUNTER — Other Ambulatory Visit (FREE_STANDING_LABORATORY_FACILITY): Payer: BLUE CROSS/BLUE SHIELD

## 2023-06-25 DIAGNOSIS — R7301 Impaired fasting glucose: Secondary | ICD-10-CM

## 2023-06-25 DIAGNOSIS — E7849 Other hyperlipidemia: Secondary | ICD-10-CM

## 2023-06-25 DIAGNOSIS — I10 Essential (primary) hypertension: Secondary | ICD-10-CM

## 2023-06-25 LAB — COMPREHENSIVE METABOLIC PANEL
ALT: 23 U/L (ref 0–55)
AST (SGOT): 22 U/L (ref 5–41)
Albumin/Globulin Ratio: 1.8 (ref 0.9–2.2)
Albumin: 3.9 g/dL (ref 3.5–5.0)
Alkaline Phosphatase: 75 U/L (ref 37–117)
Anion Gap: 10 (ref 5.0–15.0)
BUN: 21 mg/dL (ref 7–21)
Bilirubin, Total: 0.5 mg/dL (ref 0.2–1.2)
CO2: 21 meq/L (ref 17–29)
Calcium: 9.8 mg/dL (ref 7.9–10.2)
Chloride: 112 meq/L — ABNORMAL HIGH (ref 99–111)
Creatinine: 1.6 mg/dL — ABNORMAL HIGH (ref 0.4–1.0)
GFR: 33.9 mL/min/{1.73_m2} — ABNORMAL LOW (ref >=60.0)
Globulin: 2.2 g/dL (ref 2.0–3.6)
Glucose: 89 mg/dL (ref 70–100)
Hemolysis Index: 3 {index}
Potassium: 4.4 meq/L (ref 3.5–5.3)
Protein, Total: 6.1 g/dL (ref 6.0–8.3)
Sodium: 143 meq/L (ref 135–145)

## 2023-06-25 LAB — LIPID PANEL
Cholesterol / HDL Ratio: 3.2 {index}
Cholesterol: 170 mg/dL (ref <=199)
HDL: 53 mg/dL (ref >=40)
LDL Calculated: 96 mg/dL (ref 0–99)
Triglycerides: 103 mg/dL (ref 34–149)
VLDL Calculated: 21 mg/dL (ref 10–40)

## 2023-06-25 LAB — HEMOGLOBIN A1C
Average Estimated Glucose: 111.2 mg/dL
Hemoglobin A1C: 5.5 % (ref 4.6–5.6)

## 2023-06-26 NOTE — Progress Notes (Signed)
Creatinine is a bit worse but not significantly so.  Liver enzymes, lipids, A1C all look good.

## 2023-07-06 NOTE — Progress Notes (Signed)
Subjective:      Patient ID: Victoria Fields is a 72 y.o. female     Chief Complaint   Patient presents with    Obesity    Hypertension    Hyperlipidemia    Chronic Kidney Disease        She is seen for ch care recheck.  Was seen by me last in 03/2023.  Identity: pt is known to me.  Location: in [place].    Started on [place] in 01/2022 and did well, but was not able to continue due to shortage.  Tried to fill rx's for multiple other GLP-1's somewhat unsuccessfully.  Had lost 15 pounds from initial Wegovy, now dose up to 1.7 mg and is feeling the appetite suppression.  Has mild constipation.    HM  Smoking: in college.  Alcohol: not much.  Diet: balanced  Exercise: regualrly  PAP with HPV was done in 02/2017.  Mammo 11/13/22, [facility].  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 Yvonna Alanis.  HCV neg.  Immuniz: UTD  FH of colon, breast, ovary, skin CA: no.  FH of DM, heart d: brother passed away with MI in 2015/11/14. He had PKD and was on dialysis. Daughter with [place] synd.    For ch care:  1)Periph neuropathy  On Lyrica 50 tid, and notes improvement.  Gabapentin caused excessive lethargy.  Originally noted when she had bleeding on her foot that she did not feel.  NCS 01/2020 showed mildly progressed sensory and motor neuropathy bilat LE's.  Labs 01/2020: low B12 at 281, now on suppl.  Homocysteine 16 and mildly elevated.  SPEP showing hypogammaglobulinemia. ANA neg.  B12 1216 in 08/2020, but homocysteine still at 17.4.  NCS 07/2022 showed same mild axonal sensory motor peripheral neuropathy, but new mild left S1 radiculopathy.  MRI L-spine 08/2022, L4/5 spondylolisthesis.  Neuro, Dr. Jovita Gamma, visit 02/2023 reviewed.  Pain Management visit 01/2023, had inj.    2)CKD 3, renal cyst, [place] synd  On Na Bicarb. Marcelline Deist was started in November 13, 2022 per Nephro.  Abd/pelvic CT 2010/11/13 left renal cyst 5 cm.  Renal sono 03/2017, exophytic cyst x 2 largest at 8 cm.  24 hr urine pro 288, cr cl 49 in 03/2018.   Urine microalbumin ratio 1115 in 04/2019, 1459 in 09/2019,  429 in 08/2020, 803 in 09/2022, 368 in 03/2023.  BUN/Creat 28/1.46 in 05/2016.  21/1.77/30 in 09/2017, was instructed to cut Lisinopril to 20 mg.  28/2.08/25 in 03/2022 while ill,  20/1.4/40 in 09/2022,  23/1.59 in 03/2023  21/1.6 in 06/2023.  CBC H/H 13.5/40.8 in 09/2021, 11.6/35.1 in 03/2022, 13.9/42.7 (with WBC 12.3) in 03/2023.  PTH 70 in 09/2021, 82 in 03/2022, 49 in 09/2022, 61 in 03/2023.  Saw Dr. Tereso Newcomer then Dr. Lane Hacker 03/2023, note reviewed.     2)Hyperuricemia, kidney stone  On Allopurinol 200 (Uloric not covered by ins).  Uric acid: 4.7 in 12/2017, 3.9 in 03/2018, 5.1 in 01/2021.    3)Hypertension:  On Lisinopril 5 mg (reduced gradually from 20 mg).   Cartia XT 120 held by Dr. Tereso Newcomer. Off Amlodipine.  EST in 02/2015 nl.   Echo in 01/2015 showed mild LVH and tr tricuspid regurg and mild aortic valve sclerosis without stenosis  Saw Dr. Loleta Chance 05/2023, Echo and EST ordered.  Home BP:     4)Hyperlipidemia, fatty liver  On Zetia 10, Crestor 20, Lovaza 2 gm. Off Simvastatin 60.  Lipids: 170/103/53/96 in 06/2023, 198/164/50/115 in 05/2022, 232/124/61/149 in 02/2020, 185/148/54/101 in 13-Nov-2017, 214/212/47/125 in 2016/11/13.  LFT  nl 06/2023.    5)Hypothyroidism:  Currently taking 50 mcg of brand name Synthroid daily.  TSH in a good range 09/2022.  Sono 12/2017, nodular thyroid without change.    6)IFG  A1C 6.0 in 10/2016, 5.8 in 03/2018, 6.4 in 09/2021, 6.3 in 03/2023, 5.5 in 06/2023.    7)GERD, Barrett's since 2013  Back on Prevacid, off Nexium due to nausea., Pepcid PRN.  EGD 04/2018 Dr. Lilia Pro, no Barretts, but + esophagitis despite Prevacid.  Update EGD 04/2020, Dr. Yvonna Alanis, no Barretts, but [facility].  GI visit 03/2023.  Symptoms:     8)OSA  mod non-positional OSA per home testing 01/2018. Was referred for titration study, but she can't keep CPAP on.    9)Exercise induced asthma, allergy  Xyzal, Flonase, Flovent PRN, Singulair.  Was given Qvar in place of Flovent.  Has bad taste in mouth, difficult to use.  Was seen on 08/2022 for recurring vertigo.   Meclizine helped.  Ill with sinus and resp symptoms x 2 in 2023.    10) Migraine  Took Imitrex or Relpax but had severe fatigue from taking them.   Nurtec works well.. Was doing well on it but had a flare a month ago.    11) Osteopenia  DEXA T score -2.0 fem neck in 12/2022, -1.8 in 09/2019, -1.7 in 05/2017, -1.5 in 2011.  Vit D 46 in 09/2022.    11) Lip lesion benign per ENT 06/2023.    12) MSK pain  -Left shoulder rotator cuff tendinopathy 12/2019. Saw Sports Med and had inj. Had PT.   -Left lateral knee and hip pain   -Recurring tailbone pain. Was referred to PT  -S/p left foot neuroma and right 1st MTP fx, saw Dr. Kathlynn Grate 04/2021.    12) BMI>30  Weight change: 10 pounds loss, was 213 in 12/2019.  Wt Readings from Last 3 Encounters:  03/19/23 : 90.7 kg (200 lb)  09/06/22 : 90.1 kg (198 lb 9.6 oz)  08/29/22 : 90.7 kg (200 lb)            The following sections were reviewed this encounter by the provider:   Tobacco  Allergies  Meds  Problems  Med Hx  Surg Hx  Fam Hx         Review of Systems       There were no vitals taken for this visit.    Objective:     Physical Exam  Constitutional:       General: She is not in acute distress.     Appearance: Normal appearance. She is not ill-appearing.   HENT:      Head: Normocephalic and atraumatic.      Nose: Nose normal.   Eyes:      Extraocular Movements: Extraocular movements intact.      Conjunctiva/sclera: Conjunctivae normal.   Pulmonary:      Effort: Pulmonary effort is normal.   Neurological:      Mental Status: She is alert and oriented to person, place, and time.      Cranial Nerves: No cranial nerve deficit.   Psychiatric:         Mood and Affect: Mood normal.         Behavior: Behavior normal.          Assessment:     1. Benign essential hypertension  - lisinopril (ZESTRIL) 2.5 MG tablet; Take 1 tablet (2.5 mg) by mouth daily  Dispense: 90 tablet; Refill: 3  - semaglutide [facility])  1.7 MG/0.75ML injection; Inject 0.75 mLs (1.7 mg) into the skin once a week   Dispense: 3 mL; Refill: 3    2. Other hyperlipidemia  - ezetimibe (ZETIA) 10 MG tablet; Take 1 tablet (10 mg) by mouth daily  Dispense: 90 tablet; Refill: 3  - semaglutide [facility]) 1.7 MG/0.75ML injection; Inject 0.75 mLs (1.7 mg) into the skin once a week  Dispense: 3 mL; Refill: 3    3. Impaired fasting glucose  - semaglutide [facility]) 1.7 MG/0.75ML injection; Inject 0.75 mLs (1.7 mg) into the skin once a week  Dispense: 3 mL; Refill: 3    4. 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 Stage 3b chronic kidney disease    6. Fatty liver  - semaglutide [facility]) 1.7 MG/0.75ML injection; Inject 0.75 mLs (1.7 mg) into the skin once a week  Dispense: 3 mL; Refill: 3    7. Other specified hypothyroidism  - Synthroid 50 MCG tablet; TAKE 1 TABLET ONCE DAILY AT6:00AM  Dispense: 90 tablet; Refill: 3  - TSH; Future    8. Hyperuricemia  - allopurinol (ZYLOPRIM) 100 MG tablet; TAKE 2 TABLETS (200MG       TOTAL) DAILY  Dispense: 180 tablet; Refill: 3    9. Obstructive sleep apnea syndrome  - semaglutide [facility]) 1.7 MG/0.75ML injection; Inject 0.75 mLs (1.7 mg) into the skin once a week  Dispense: 3 mL; Refill: 3    10. Mild intermittent reactive airway disease without complication    11. Non-seasonal allergic rhinitis due to other allergic trigger  - levocetirizine (XYZAL) 5 MG tablet; Take 1 tablet (5 mg) by mouth every evening  Dispense: 90 tablet; Refill: 3    12. Osteopenia of multiple sites    13. Lumbar radiculopathy  - semaglutide [facility]) 1.7 MG/0.75ML injection; Inject 0.75 mLs (1.7 mg) into the skin once a week  Dispense: 3 mL; Refill: 3    14. Obesity with body mass index 30 or greater  - semaglutide [facility]) 1.7 MG/0.75ML injection; Inject 0.75 mLs (1.7 mg) into the skin once a week  Dispense: 3 mL; Refill: 3      Plan:     Continue ZOXWRU.    Please also continue working on quality low fat low sugar diet as well as regular exercises.     Reduce dose of Lisinopril to 2.5 mg daily and monitor BP at home.  Desirable  range of BP is 110/70-135/85  Please call if consistently out of range.    Janyla Biscoe Paschal Dopp, MD

## 2023-07-07 ENCOUNTER — Other Ambulatory Visit (INDEPENDENT_AMBULATORY_CARE_PROVIDER_SITE_OTHER): Payer: Self-pay | Admitting: Family Medicine

## 2023-07-07 DIAGNOSIS — M5416 Radiculopathy, lumbar region: Secondary | ICD-10-CM

## 2023-07-07 DIAGNOSIS — E669 Obesity, unspecified: Secondary | ICD-10-CM

## 2023-07-07 DIAGNOSIS — N1831 Chronic kidney disease, stage 3a: Secondary | ICD-10-CM

## 2023-07-07 DIAGNOSIS — K760 Fatty (change of) liver, not elsewhere classified: Secondary | ICD-10-CM

## 2023-07-07 DIAGNOSIS — G4733 Obstructive sleep apnea (adult) (pediatric): Secondary | ICD-10-CM

## 2023-07-07 DIAGNOSIS — E7849 Other hyperlipidemia: Secondary | ICD-10-CM

## 2023-07-07 DIAGNOSIS — R7301 Impaired fasting glucose: Secondary | ICD-10-CM

## 2023-07-07 DIAGNOSIS — I10 Essential (primary) hypertension: Secondary | ICD-10-CM

## 2023-07-09 NOTE — Telephone Encounter (Signed)
Patient has appt with PCP 07/24/23.   Continue at 1mg  for now or go up to next dose.   See response to patient re: request.

## 2023-07-10 ENCOUNTER — Other Ambulatory Visit (INDEPENDENT_AMBULATORY_CARE_PROVIDER_SITE_OTHER): Payer: Self-pay | Admitting: Family Medicine

## 2023-07-10 DIAGNOSIS — I10 Essential (primary) hypertension: Secondary | ICD-10-CM

## 2023-07-10 DIAGNOSIS — K760 Fatty (change of) liver, not elsewhere classified: Secondary | ICD-10-CM

## 2023-07-10 DIAGNOSIS — E669 Obesity, unspecified: Secondary | ICD-10-CM

## 2023-07-10 DIAGNOSIS — M5416 Radiculopathy, lumbar region: Secondary | ICD-10-CM

## 2023-07-10 DIAGNOSIS — G4733 Obstructive sleep apnea (adult) (pediatric): Secondary | ICD-10-CM

## 2023-07-10 DIAGNOSIS — E7849 Other hyperlipidemia: Secondary | ICD-10-CM

## 2023-07-10 DIAGNOSIS — R7301 Impaired fasting glucose: Secondary | ICD-10-CM

## 2023-07-10 MED ORDER — SEMAGLUTIDE-WEIGHT MANAGEMENT 1.7 MG/0.75ML SC SOAJ
1.7000 mg | SUBCUTANEOUS | 1 refills | Status: DC
Start: 2023-07-10 — End: 2023-07-24

## 2023-07-10 NOTE — Telephone Encounter (Signed)
Do you want to increase the dose, or keep the same until appt 11/5?

## 2023-07-10 NOTE — Telephone Encounter (Signed)
SWP, informed 1.7mg  dose was sent in per PCP

## 2023-07-24 ENCOUNTER — Telehealth (INDEPENDENT_AMBULATORY_CARE_PROVIDER_SITE_OTHER): Payer: BLUE CROSS/BLUE SHIELD | Admitting: Family Medicine

## 2023-07-24 ENCOUNTER — Encounter (INDEPENDENT_AMBULATORY_CARE_PROVIDER_SITE_OTHER): Payer: Self-pay | Admitting: Family Medicine

## 2023-07-24 ENCOUNTER — Other Ambulatory Visit (INDEPENDENT_AMBULATORY_CARE_PROVIDER_SITE_OTHER): Payer: Self-pay | Admitting: Family Medicine

## 2023-07-24 DIAGNOSIS — E669 Obesity, unspecified: Secondary | ICD-10-CM

## 2023-07-24 DIAGNOSIS — E790 Hyperuricemia without signs of inflammatory arthritis and tophaceous disease: Secondary | ICD-10-CM

## 2023-07-24 DIAGNOSIS — N1832 Chronic kidney disease, stage 3b: Secondary | ICD-10-CM | POA: Insufficient documentation

## 2023-07-24 DIAGNOSIS — I10 Essential (primary) hypertension: Secondary | ICD-10-CM

## 2023-07-24 DIAGNOSIS — K760 Fatty (change of) liver, not elsewhere classified: Secondary | ICD-10-CM

## 2023-07-24 DIAGNOSIS — R7301 Impaired fasting glucose: Secondary | ICD-10-CM

## 2023-07-24 DIAGNOSIS — E7849 Other hyperlipidemia: Secondary | ICD-10-CM

## 2023-07-24 DIAGNOSIS — M5416 Radiculopathy, lumbar region: Secondary | ICD-10-CM

## 2023-07-24 DIAGNOSIS — E038 Other specified hypothyroidism: Secondary | ICD-10-CM

## 2023-07-24 DIAGNOSIS — G4733 Obstructive sleep apnea (adult) (pediatric): Secondary | ICD-10-CM

## 2023-07-24 DIAGNOSIS — J3089 Other allergic rhinitis: Secondary | ICD-10-CM

## 2023-07-24 DIAGNOSIS — Q8781 Alport syndrome: Secondary | ICD-10-CM

## 2023-07-24 DIAGNOSIS — J4520 Mild intermittent asthma, uncomplicated: Secondary | ICD-10-CM

## 2023-07-24 DIAGNOSIS — M8589 Other specified disorders of bone density and structure, multiple sites: Secondary | ICD-10-CM

## 2023-07-24 MED ORDER — ALLOPURINOL 100 MG PO TABS
ORAL_TABLET | ORAL | 3 refills | Status: DC
Start: 2023-07-24 — End: 2024-06-04

## 2023-07-24 MED ORDER — LEVOCETIRIZINE DIHYDROCHLORIDE 5 MG PO TABS
5.0000 mg | ORAL_TABLET | Freq: Every evening | ORAL | 3 refills | Status: DC
Start: 2023-07-24 — End: 2024-06-04

## 2023-07-24 MED ORDER — EZETIMIBE 10 MG PO TABS
10.0000 mg | ORAL_TABLET | Freq: Every day | ORAL | 3 refills | Status: DC
Start: 2023-07-24 — End: 2024-06-04

## 2023-07-24 MED ORDER — SEMAGLUTIDE-WEIGHT MANAGEMENT 1.7 MG/0.75ML SC SOAJ
1.7000 mg | SUBCUTANEOUS | 3 refills | Status: DC
Start: 2023-07-24 — End: 2023-07-24

## 2023-07-24 MED ORDER — SEMAGLUTIDE-WEIGHT MANAGEMENT 1.7 MG/0.75ML SC SOAJ
1.7000 mg | SUBCUTANEOUS | 3 refills | Status: DC
Start: 2023-07-24 — End: 2023-11-21

## 2023-07-24 MED ORDER — SYNTHROID 50 MCG PO TABS
ORAL_TABLET | ORAL | 3 refills | Status: DC
Start: 2023-07-24 — End: 2024-06-04

## 2023-07-24 MED ORDER — SEMAGLUTIDE-WEIGHT MANAGEMENT 1.7 MG/0.75ML SC SOAJ
1.7000 mg | SUBCUTANEOUS | 1 refills | Status: DC
Start: 2023-07-24 — End: 2023-07-24

## 2023-07-24 MED ORDER — SEMAGLUTIDE-WEIGHT MANAGEMENT 1.7 MG/0.75ML SC SOAJ
1.7000 mg | SUBCUTANEOUS | 0 refills | Status: DC
Start: 2023-07-24 — End: 2023-07-24

## 2023-07-24 MED ORDER — LISINOPRIL 2.5 MG PO TABS
2.5000 mg | ORAL_TABLET | Freq: Every day | ORAL | 3 refills | Status: DC
Start: 2023-07-24 — End: 2024-06-04

## 2023-07-24 NOTE — Patient Instructions (Signed)
 Continue [id number].    Please also continue working on quality low fat low sugar diet as well as regular exercises.     Reduce dose of Lisinopril to 2.5 mg daily and monitor BP at home.  Desirable range of BP is 110/70-135/85  Please call if consistently out

## 2023-08-26 ENCOUNTER — Encounter (INDEPENDENT_AMBULATORY_CARE_PROVIDER_SITE_OTHER): Payer: Self-pay | Admitting: Family Medicine

## 2023-09-07 ENCOUNTER — Other Ambulatory Visit: Payer: Self-pay | Admitting: Neurology

## 2023-09-07 DIAGNOSIS — R419 Unspecified symptoms and signs involving cognitive functions and awareness: Secondary | ICD-10-CM

## 2023-09-10 ENCOUNTER — Other Ambulatory Visit: Payer: Self-pay | Admitting: Neurology

## 2023-09-10 ENCOUNTER — Ambulatory Visit
Admission: RE | Admit: 2023-09-10 | Discharge: 2023-09-10 | Discharge status: Home / Self Care | Payer: BLUE CROSS/BLUE SHIELD | Source: Ambulatory Visit | Attending: Neurology

## 2023-09-10 DIAGNOSIS — R419 Unspecified symptoms and signs involving cognitive functions and awareness: Secondary | ICD-10-CM | POA: Insufficient documentation

## 2023-09-10 MED ORDER — GADOBUTROL 1 MMOL/ML IV SOSY (WRAP)
9.0000 mL | Freq: Once | INTRAVENOUS | Status: AC | PRN
Start: 2023-09-10 — End: 2023-09-10
  Administered 2023-09-10: 9 mL via INTRAMUSCULAR
  Filled 2023-09-10: qty 10

## 2023-09-26 ENCOUNTER — Other Ambulatory Visit (INDEPENDENT_AMBULATORY_CARE_PROVIDER_SITE_OTHER): Payer: Self-pay | Admitting: Family Medicine

## 2023-09-26 DIAGNOSIS — J4520 Mild intermittent asthma, uncomplicated: Secondary | ICD-10-CM

## 2023-10-01 ENCOUNTER — Encounter (INDEPENDENT_AMBULATORY_CARE_PROVIDER_SITE_OTHER): Payer: Self-pay | Admitting: Family Medicine

## 2023-10-03 ENCOUNTER — Encounter (INDEPENDENT_AMBULATORY_CARE_PROVIDER_SITE_OTHER): Payer: Self-pay | Admitting: Family Medicine

## 2023-10-04 ENCOUNTER — Other Ambulatory Visit (INDEPENDENT_AMBULATORY_CARE_PROVIDER_SITE_OTHER): Payer: Self-pay | Admitting: Family Medicine

## 2023-10-04 DIAGNOSIS — R7301 Impaired fasting glucose: Secondary | ICD-10-CM

## 2023-10-04 DIAGNOSIS — E7849 Other hyperlipidemia: Secondary | ICD-10-CM

## 2023-10-04 DIAGNOSIS — G4733 Obstructive sleep apnea (adult) (pediatric): Secondary | ICD-10-CM

## 2023-10-04 DIAGNOSIS — K760 Fatty (change of) liver, not elsewhere classified: Secondary | ICD-10-CM

## 2023-10-04 DIAGNOSIS — I10 Essential (primary) hypertension: Secondary | ICD-10-CM

## 2023-10-04 DIAGNOSIS — E669 Obesity, unspecified: Secondary | ICD-10-CM

## 2023-10-05 NOTE — Telephone Encounter (Signed)
Pt is at 1.7, taking fiber for constipation, at 183 lbs.

## 2023-11-11 NOTE — Progress Notes (Signed)
 Subjective:      Patient ID: Victoria Fields is a 73 y.o. female     Chief Complaint   Patient presents with    Follow-up    Hyperlipidemia    Hypertension    Hypothyroidism    Chronic Kidney Disease        She is seen for ch care recheck.  Was seen by me last in 07/2023.    HM  Smoking: in college.  Alcohol: not much.  Diet: balanced  Exercise: regualrly  PAP with HPV was done in 02/2017.  Mammo 2022/11/16, [facility].  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 Lisa.  HCV neg.  Immuniz: UTD  FH of colon, breast, ovary, skin CA: no.  FH of DM, heart d: brother passed away with MI in 2015-11-17. He had PKD and was on dialysis. Daughter with Alport synd.    For ch care:  1) Periph neuropathy  On Lyrica 50 bid-tid, and notes improvement.  Gabapentin caused excessive lethargy.  Originally noted when she had bleeding on her foot that she did not feel.  NCS 01/2020 showed mildly progressed sensory and motor neuropathy bilat LE's.  Labs 01/2020: low B12 at 281, now on suppl.  Homocysteine 16 and mildly elevated.  SPEP showing hypogammaglobulinemia. ANA neg.  B12 1216 in 08/2020, but homocysteine still at 17.4.  NCS 07/2022 showed same mild axonal sensory motor peripheral neuropathy, but new mild left S1 radiculopathy.  MRI L-spine 08/2022, L4/5 spondylolisthesis.  Neuro, Dr. Roselynn, visit 08/2023 reviewed.  Pain Management visit 07/2023, had inj.    2) Migraine  Took Imitrex or Relpax but had severe fatigue from taking them.   Nurtec works well.. Was doing well on it but had a flare a month ago.    3) Cognitive impairment, word finding difficulty (likely anxiety related)  MRI brain 08/2023, mild chronic microangiopathic changes and mild generalized volume loss, neuroquant analysis demonstrates normal hippocampal and ventricular volumes.    4) OSA  Mod non-positional OSA per home testing 01/2018. Was referred for titration study, but she can't keep CPAP on.    5) Hypertension:  On Lisinopril  2.5 mg (reduced gradually from 20 mg).   Cartia XT  120 held by Dr. Musio. Off Amlodipine.  EST in 02/2015 nl.   Echo 01/2015, mild LVH and tr tricuspid regurg and mild aortic valve sclerosis without stenosis.  Echo 05/2023, LVEF 60%, mild AS and PR.  EST 05/2023.  Saw Dr. Leigh 05/2023.  Home BP:     6) Hyperlipidemia, fatty liver  On Zetia  10, Crestor  20, Lovaza  2 gm. Off Simvastatin  60.  Lipids: 170/103/53/96 in 06/2023, 198/164/50/115 in 05/2022, 232/124/61/149 in 02/2020, 185/148/54/101 in Nov 16, 2017, 214/212/47/125 in 11/16/16.  LFT nl 06/2023.    7) CKD 3, renal cyst, Alports synd  On Na Bicarb. Farxiga was started in 2022-11-16 per Nephro.  Abd/pelvic CT 2012 left renal cyst 5 cm.  Renal sono 03/2017, exophytic cyst x 2 largest at 8 cm.  24 hr urine pro 288, cr cl 49 in 03/2018.   Urine microalbumin ratio 1115 in 04/2019, 1459 in 09/2019, 429 in 08/2020, 368 in 03/2023, 230 in 09/2023.  U/a WBC 11-30, cx neg 09/2023.  BUN/Creat 28/1.46 in 05/2016.  21/1.77/30 in 09/2017, was instructed to cut Lisinopril  to 20 mg.  28/2.08/25 in 03/2022 while ill,  20/1.4/40 in 09/2022,  21/1.6 in 06/2023,  34/1.59/35 in 09/2023.  CBC H/H 13.5/40.8 in 09/2021, 11.6/35.1 in 03/2022, 13.9/42.7 (with WBC 12.3) in 03/2023, 14.2/43.8 in 09/2023.  PTH 70 in 09/2021, 82 in 03/2022, 49 in 09/2022, 61 in 03/2023, 77 in 09/2023.  Saw Dr. Laurinda then Dr. Kristi 09/2023, note reviewed.     8) Hyperuricemia, kidney stone  On Allopurinol  200 (Uloric not covered by ins).  Uric acid: 4.7 in 12/2017, 3.9 in 03/2018, 5.1 in 01/2021.    9) Hypothyroidism:  Currently taking 50 mcg of brand name Synthroid  daily.  TSH in a good range 10/2023.  Sono 12/2017, nodular thyroid without change.    10) IFG  A1C 6.0 in 10/2016, 5.8 in 03/2018, 6.4 in 09/2021, 6.3 in 03/2023, 5.5 in 06/2023.    11) GERD, Barrett's since 2013  On Pepcid 40 qd and Prevacid. Off Nexium (due to nausea).  EGD 04/2018 Dr. Drue Cramp, no Barretts, but + esophagitis despite Prevacid.  Update EGD 04/2020, Dr. Lisa, no Barretts, but [facility]  GI visit 10/2023.  Symptoms:     12)  Exercise induced asthma, allergy  Xyzal , Flonase , Flovent  PRN, Singulair .  Was given Qvar in place of Flovent .  Has bad taste in mouth, difficult to use.  Was seen on 08/2022 for recurring vertigo.  Meclizine helped.  Ill with sinus and resp symptoms x 2 in 2023.    13) Lip lesion benign per ENT 06/2023    14) Osteopenia  DEXA T score -2.0 fem neck in 12/2022, -1.8 in 09/2019, -1.7 in 05/2017, -1.5 in 2011.  Vit D 46 in 09/2022.    15) MSK pain  -Left shoulder rotator cuff tendinopathy 12/2019. Saw Sports Med and had inj. Had PT.   -Left lateral knee and hip pain   -Recurring tailbone pain. Was referred to PT  -S/p left foot neuroma and right 1st MTP fx, saw Dr. Ewing 04/2021.    16) BMI>30  Started on Wegovy  in 01/2022 and did well, but was not able to continue due to shortage.  Tried to fill rx's for multiple other GLP-1's somewhat unsuccessfully.  Wegovy  now dose up to 1.7 mg and is feeling the appetite suppression.  Has constipation as well as GERD.  Weight change: 213 in 12/2019.  Wt Readings from Last 3 Encounters:  11/21/23 : 77.7 kg (177 lb)  03/19/23 : 90.7 kg (200 lb)  09/06/22 : 90.1 kg (198 lb 9.6 oz)  08/29/22 : 90.7 kg (200 lb)            The following sections were reviewed this encounter by the provider:   Tobacco  Allergies  Meds  Problems  Med Hx  Surg Hx  Fam Hx         Review of Systems       BP 115/69 (BP Site: Right arm)   Pulse 69   Temp 97.4 F (36.3 C)   Resp 18   Ht 1.727 m (5' 8)   Wt 80.3 kg (177 lb)   BMI 26.91 kg/m     Objective:     Physical Exam  Constitutional:       General: She is not in acute distress.     Appearance: Normal appearance. She is not ill-appearing.   HENT:      Head: Normocephalic and atraumatic.      Nose: Nose normal.   Eyes:      Extraocular Movements: Extraocular movements intact.      Conjunctiva/sclera: Conjunctivae normal.   Pulmonary:      Effort: Pulmonary effort is normal. No respiratory distress.   Musculoskeletal:  General: Normal range  of motion.   Neurological:      General: No focal deficit present.      Mental Status: She is alert and oriented to person, place, and time.      Cranial Nerves: No cranial nerve deficit.      Gait: Gait normal.   Psychiatric:         Mood and Affect: Mood normal.         Behavior: Behavior normal.          Assessment:     1. Benign essential hypertension  - semaglutide  (Wegovy ) 1 MG/0.5ML injection; Inject 0.5 mLs (1 mg) into the skin once a week  Dispense: 6 mL; Refill: 0  - Comprehensive Metabolic Panel; Future    2. Nonrheumatic mitral valve regurgitation    3. Other hyperlipidemia  - semaglutide  (Wegovy ) 1 MG/0.5ML injection; Inject 0.5 mLs (1 mg) into the skin once a week  Dispense: 6 mL; Refill: 0  - Lipid Panel; Future    4. Impaired fasting glucose  - semaglutide  (Wegovy ) 1 MG/0.5ML injection; Inject 0.5 mLs (1 mg) into the skin once a week  Dispense: 6 mL; Refill: 0  - Hemoglobin A1C; Future    5. Stage 3b chronic kidney disease  - Comprehensive Metabolic Panel; Future    6. Alport syndrome-like hereditary nephritis    7. Fatty liver  - semaglutide  (Wegovy ) 1 MG/0.5ML injection; Inject 0.5 mLs (1 mg) into the skin once a week  Dispense: 6 mL; Refill: 0  - Comprehensive Metabolic Panel; Future    8. Obstructive sleep apnea syndrome  - semaglutide  (Wegovy ) 1 MG/0.5ML injection; Inject 0.5 mLs (1 mg) into the skin once a week  Dispense: 6 mL; Refill: 0    9. Mild intermittent reactive airway disease without complication  - fluticasone  (FLOVENT  HFA) 44 MCG/ACT inhaler; Inhale 2 puffs into the lungs 2 (two) times daily  Dispense: 3 each; Refill: 3    10. Barrett's esophagus without dysplasia    11. Other specified hypothyroidism    12. Hyperuricemia    13. Other migraine without status migrainosus, not intractable    14. Polyneuropathy associated with underlying disease    15. Osteopenia of multiple sites    16. Obesity with body mass index 30 or greater  - semaglutide  (Wegovy ) 1 MG/0.5ML injection; Inject 0.5  mLs (1 mg) into the skin once a week  Dispense: 6 mL; Refill: 0    17. Encounter for screening mammogram for malignant neoplasm of breast  - Mammo Screening w/Tomo Bilateral; Future  - Mammo Screening w/Tomo Bilateral        Plan:     Continue Wegovy  but at lower dose.    Please also continue working on quality low fat low sugar diet as well as regular exercises.     Continue Lisinopril  to 2.5 mg daily and monitor BP at home.  Desirable range of BP is 110/70-135/85  Please call if consistently out of range.    Please have this blood test after 8 hours fasting in September.  You may drink water and take your medications.    Review of records and visit took place over >40 min.    Victoria Fields CINDERELLA Oto, MD

## 2023-11-12 ENCOUNTER — Other Ambulatory Visit (FREE_STANDING_LABORATORY_FACILITY): Payer: BLUE CROSS/BLUE SHIELD

## 2023-11-12 ENCOUNTER — Other Ambulatory Visit (HOSPITAL_BASED_OUTPATIENT_CLINIC_OR_DEPARTMENT_OTHER): Payer: BLUE CROSS/BLUE SHIELD

## 2023-11-12 DIAGNOSIS — E038 Other specified hypothyroidism: Secondary | ICD-10-CM

## 2023-11-12 LAB — TSH: TSH: 0.42 u[IU]/mL (ref 0.35–4.94)

## 2023-11-13 NOTE — Progress Notes (Signed)
Thyroid is in the normal range.

## 2023-11-21 ENCOUNTER — Encounter (INDEPENDENT_AMBULATORY_CARE_PROVIDER_SITE_OTHER): Payer: Self-pay | Admitting: Family Medicine

## 2023-11-21 ENCOUNTER — Ambulatory Visit (INDEPENDENT_AMBULATORY_CARE_PROVIDER_SITE_OTHER): Payer: BLUE CROSS/BLUE SHIELD | Admitting: Family Medicine

## 2023-11-21 ENCOUNTER — Telehealth (INDEPENDENT_AMBULATORY_CARE_PROVIDER_SITE_OTHER): Payer: Self-pay | Admitting: Family Medicine

## 2023-11-21 VITALS — BP 115/69 | HR 69 | Temp 97.4°F | Resp 18 | Ht 68.0 in | Wt 177.0 lb

## 2023-11-21 DIAGNOSIS — K2270 Barrett's esophagus without dysplasia: Secondary | ICD-10-CM

## 2023-11-21 DIAGNOSIS — E7849 Other hyperlipidemia: Secondary | ICD-10-CM

## 2023-11-21 DIAGNOSIS — M8589 Other specified disorders of bone density and structure, multiple sites: Secondary | ICD-10-CM

## 2023-11-21 DIAGNOSIS — R7301 Impaired fasting glucose: Secondary | ICD-10-CM

## 2023-11-21 DIAGNOSIS — E669 Obesity, unspecified: Secondary | ICD-10-CM

## 2023-11-21 DIAGNOSIS — Q8781 Alport syndrome: Secondary | ICD-10-CM

## 2023-11-21 DIAGNOSIS — N1832 Chronic kidney disease, stage 3b: Secondary | ICD-10-CM

## 2023-11-21 DIAGNOSIS — G4733 Obstructive sleep apnea (adult) (pediatric): Secondary | ICD-10-CM

## 2023-11-21 DIAGNOSIS — G43809 Other migraine, not intractable, without status migrainosus: Secondary | ICD-10-CM

## 2023-11-21 DIAGNOSIS — E790 Hyperuricemia without signs of inflammatory arthritis and tophaceous disease: Secondary | ICD-10-CM

## 2023-11-21 DIAGNOSIS — E038 Other specified hypothyroidism: Secondary | ICD-10-CM

## 2023-11-21 DIAGNOSIS — I340 Nonrheumatic mitral (valve) insufficiency: Secondary | ICD-10-CM

## 2023-11-21 DIAGNOSIS — K760 Fatty (change of) liver, not elsewhere classified: Secondary | ICD-10-CM

## 2023-11-21 DIAGNOSIS — Z1231 Encounter for screening mammogram for malignant neoplasm of breast: Secondary | ICD-10-CM

## 2023-11-21 DIAGNOSIS — I10 Essential (primary) hypertension: Secondary | ICD-10-CM

## 2023-11-21 DIAGNOSIS — J4520 Mild intermittent asthma, uncomplicated: Secondary | ICD-10-CM

## 2023-11-21 DIAGNOSIS — G63 Polyneuropathy in diseases classified elsewhere: Secondary | ICD-10-CM

## 2023-11-21 MED ORDER — SEMAGLUTIDE-WEIGHT MANAGEMENT 1 MG/0.5ML SC SOAJ
1.0000 mg | SUBCUTANEOUS | 0 refills | Status: DC
Start: 2023-11-21 — End: 2023-11-21

## 2023-11-21 MED ORDER — FLUTICASONE PROPIONATE HFA 44 MCG/ACT IN AERO
2.0000 | INHALATION_SPRAY | Freq: Two times a day (BID) | RESPIRATORY_TRACT | 3 refills | Status: AC
Start: 2023-11-21 — End: ?

## 2023-11-21 MED ORDER — SEMAGLUTIDE-WEIGHT MANAGEMENT 1 MG/0.5ML SC SOAJ
1.0000 mg | SUBCUTANEOUS | Status: DC
Start: 2023-11-21 — End: 2023-11-22

## 2023-11-21 NOTE — Telephone Encounter (Signed)
 Pt would like Wegovy 1 mg in place of 1.7 mg due to GI side effect.  [facility] rx is not going through.  Does this need a new PA?

## 2023-11-21 NOTE — Patient Instructions (Signed)
 Continue Wegovy  but at lower dose.    Please also continue working on quality low fat low sugar diet as well as regular exercises.     Continue Lisinopril  to 2.5 mg daily and monitor BP at home.  Desirable range of BP is 110/70-135/85  Please call if consistently out of range.    Please have this blood test after 8 hours fasting in September.  You may drink water and take your medications.

## 2023-11-22 ENCOUNTER — Other Ambulatory Visit (INDEPENDENT_AMBULATORY_CARE_PROVIDER_SITE_OTHER): Payer: Self-pay | Admitting: Family Medicine

## 2023-11-22 DIAGNOSIS — K760 Fatty (change of) liver, not elsewhere classified: Secondary | ICD-10-CM

## 2023-11-22 DIAGNOSIS — R7301 Impaired fasting glucose: Secondary | ICD-10-CM

## 2023-11-22 DIAGNOSIS — G4733 Obstructive sleep apnea (adult) (pediatric): Secondary | ICD-10-CM

## 2023-11-22 DIAGNOSIS — I10 Essential (primary) hypertension: Secondary | ICD-10-CM

## 2023-11-22 DIAGNOSIS — E7849 Other hyperlipidemia: Secondary | ICD-10-CM

## 2023-11-22 DIAGNOSIS — E669 Obesity, unspecified: Secondary | ICD-10-CM

## 2023-11-22 MED ORDER — SEMAGLUTIDE-WEIGHT MANAGEMENT 1 MG/0.5ML SC SOAJ
1.0000 mg | SUBCUTANEOUS | 0 refills | Status: DC
Start: 2023-11-22 — End: 2023-11-22

## 2023-11-22 MED ORDER — SEMAGLUTIDE-WEIGHT MANAGEMENT 1 MG/0.5ML SC SOAJ
1.0000 mg | SUBCUTANEOUS | 6 refills | Status: DC
Start: 2023-11-22 — End: 2024-06-04

## 2023-11-22 NOTE — Telephone Encounter (Signed)
Rx sent.  Please notify.

## 2023-11-22 NOTE — Telephone Encounter (Signed)
 Called retail pharmacy for patient; cost for a one month supply is $642.   Already discussed this w/patient and she will not pay this cost.   Rx sent back to [facility] with transmission failed again.  [facility] on her [employer] card: [phone].   The issue is her med is being resent for fill too soon. She has a quantity limit of 9ml in 63 days and has already received 6ml of it; she can only get 3ml at this time.   She can get the full script of 6 ml on 3/25.   The change in dose rep says will NOT trigger a PA.   Asked re: future dating the rx and she transferred me to the FAST START dept to set that up.   Verified pt info with that dept and then sent to pharmacist.    Pharmacist, Arlyne GAILS, stated that [facility] QUIT dispensing ALL GLP-1 on 01/26/2023.   The last time they sent to patient was 09/27/22.     Looking a little further into her file I do see prescriptions sent to [facility], although pt denies ever getting if from there in earlier conversation.     PLEASE f/u with patient.

## 2023-11-22 NOTE — Telephone Encounter (Addendum)
 Able to go thru when submitted to local pharmacy; I have seen this before.   Will call patient to give her a heads up and will see if she is ok with that; and if it triggers a PA going back in her dose.     9:06  Talked to pt; she understands the issue but is wary of it costing her much more at retail.   Advised her to check in a bit if she can and see if she can refill it there and the cost.   Advised her that going back in the dose is very likely to trigger PA.   Will need to list what her side effects are in that case.

## 2023-11-22 NOTE — Telephone Encounter (Signed)
 Please resent pt's prescribed Wegovy 1 mg to the [facility] on file as per pt, they will only accept the rx's in one month supplies (for 30 days each).

## 2023-11-23 ENCOUNTER — Encounter (INDEPENDENT_AMBULATORY_CARE_PROVIDER_SITE_OTHER): Payer: Self-pay | Admitting: Family Medicine

## 2023-11-23 NOTE — Telephone Encounter (Signed)
 Pt informed

## 2023-12-04 ENCOUNTER — Other Ambulatory Visit: Payer: Self-pay | Admitting: Family Medicine

## 2023-12-06 ENCOUNTER — Ambulatory Visit (INDEPENDENT_AMBULATORY_CARE_PROVIDER_SITE_OTHER): Payer: Self-pay | Admitting: Family Medicine

## 2023-12-06 NOTE — Progress Notes (Signed)
 This mammogram showed no sign of breast cancer.

## 2023-12-31 ENCOUNTER — Encounter (INDEPENDENT_AMBULATORY_CARE_PROVIDER_SITE_OTHER): Payer: Self-pay | Admitting: Family Medicine

## 2024-03-11 ENCOUNTER — Encounter (INDEPENDENT_AMBULATORY_CARE_PROVIDER_SITE_OTHER): Payer: Self-pay | Admitting: Family Medicine

## 2024-03-11 ENCOUNTER — Encounter (INDEPENDENT_AMBULATORY_CARE_PROVIDER_SITE_OTHER): Payer: Self-pay | Admitting: Student in an Organized Health Care Education/Training Program

## 2024-03-13 ENCOUNTER — Encounter (INDEPENDENT_AMBULATORY_CARE_PROVIDER_SITE_OTHER): Payer: Self-pay | Admitting: Student in an Organized Health Care Education/Training Program

## 2024-03-24 NOTE — Progress Notes (Unsigned)
 [facility]  OFFICE VISIT  [facility]              Date of Exam: 03/25/2024 3:30 PM        Patient ID: Victoria Fields is a 73 y.o. female.  Attending Physician: Eilene Everitt Clint Abbey, DO        Chief Complaint:    Chief Complaint   Patient presents with    Procedure     Back pain           HPI:   Patient presents for evaluation of back pain  After discussion of risks/benefits, decided to proceed with OMT     History of Present Illness  Victoria Fields is a 73 year old female with polyneuropathy, lumbar radiculopathy, and osteopenia who presents with back pain.    She experiences back pain affecting both her lower and upper back. The lower back pain has persisted for two and a half to three years, initially triggered by helping her daughter move into a three-story townhouse, leading to shooting pain down her right leg. She receives steroid injections for this pain, which provide temporary relief, and recently tried nerve block injections that lasted about 36 hours.    Her upper back pain developed within the last year, located in the 'bra band area' or slightly higher, especially after prolonged standing or walking. She notes a decrease in height from 5'10 to 5'8, with a family history of significant height loss in her mother and cousins.    She exercises daily, including using a Land, and stretches regularly. Despite these efforts, stretching does not significantly alleviate her pain. She is able to touch her toes and considers herself limber for her age.    She has a history of polyneuropathy, lumbar radiculopathy, and osteopenia. She takes Lyrica for numbness and nerve pain in her feet, but it does not alleviate her back pain. She avoids Advil and Naprosyn due to kidney disease but does occasionally take it to help her sleep. She finds Tylenol ineffective for pain relief.    She has a history of a broken tailbone, attributed to childbirth about 38 yrs ago,  and has undergone physical therapy for it. She also mentions a broken bone in her foot a few years ago. No history of rheumatoid arthritis, but acknowledges having arthritis due to age.              Problem List:    Patient Active Problem List   Diagnosis    Allergic rhinitis    Barrett's esophagus    Alport syndrome-like hereditary nephritis    Benign essential hypertension    Benign neoplasm of thyroid gland    Hyperlipidemia    Hyperuricemia    Hypothyroidism    Impaired fasting glucose    Kidney stone    Obesity with body mass index 30 or greater    Obstructive sleep apnea syndrome    Osteopenia    Reactive airway disease    Mitral valve regurgitation    Urine test positive for microalbuminuria    Screening for malignant neoplasm of breast    Fatty liver    Vitamin B12 deficiency    Polyneuropathy associated with underlying disease    Other migraine without status migrainosus, not intractable    Lumbar radiculopathy    Stage 3b chronic kidney disease (CMS/HCC)             Current Meds:    Outpatient  Medications Marked as Taking for the 03/25/24 encounter (Office Visit) with Everitt Clint Kill, Eilene, DO   Medication Sig Dispense Refill    albuterol  sulfate HFA (Ventolin  HFA) 108 (90 Base) MCG/ACT inhaler Inhale 2 puffs into the lungs every 4 (four) hours as needed for Wheezing or Shortness of Breath 1 each 3    allopurinol  (ZYLOPRIM ) 100 MG tablet TAKE 2 TABLETS (200MG       TOTAL) DAILY 180 tablet 3    b complex vitamins capsule Take 1 capsule by mouth daily      Calcium  Citrate-Vitamin D  (CALCIUM  + D PO) Take by mouth      Cholecalciferol (Vitamin D ) 50 MCG (2000 UT) Cap 2 (two) times daily         ezetimibe  (ZETIA ) 10 MG tablet Take 1 tablet (10 mg) by mouth daily 90 tablet 3    Farxiga 10 MG Tab Take 1 tablet (10 mg) by mouth      fluticasone  (FLONASE ) 50 MCG/ACT nasal spray 2 sprays by Nasal route daily 48 g 3    fluticasone  (FLOVENT  HFA) 44 MCG/ACT inhaler Inhale 2 puffs into the lungs 2 (two) times daily 3 each 3     lansoprazole (PREVACID) 30 MG capsule Take 1 capsule (30 mg) by mouth daily      levocetirizine (XYZAL ) 5 MG tablet Take 1 tablet (5 mg) by mouth every evening 90 tablet 3    lisinopril  (ZESTRIL ) 2.5 MG tablet Take 1 tablet (2.5 mg) by mouth daily 90 tablet 3    montelukast  (SINGULAIR ) 10 MG tablet Take 1 tablet (10 mg) by mouth nightly 90 tablet 3    omega-3 acid ethyl esters (LOVAZA ) 1 g capsule Take 1 capsule (1 g) by mouth 2 (two) times daily 180 capsule 3    rosuvastatin  (CRESTOR ) 20 MG tablet Take 1 tablet (20 mg) by mouth daily 90 tablet 3    semaglutide  (Wegovy ) 1 MG/0.5ML injection Inject 0.5 mLs (1 mg) into the skin once a week 2 mL 6    sodium bicarbonate 650 MG tablet Take 1 tablet (650 mg) by mouth 2 (two) times daily      Synthroid  50 MCG tablet TAKE 1 TABLET ONCE DAILY AT6:00AM 90 tablet 3          Allergies:    Allergies   Allergen Reactions    Cephalosporins Anaphylaxis    Strawberry Extract Hives    Cefuroxime     Dust Mite Extract     Molds & Smuts     Niacin Itching    Pollen Extract     Tree Extract              Past Surgical History:    Past Surgical History:   Procedure Laterality Date    CATARACT EXTRACTION  11/16/2008    COLONOSCOPY, DIAGNOSTIC (SCREENING)  02/17/2015    COLONOSCOPY, DIAGNOSTIC (SCREENING)  08/08/2007    DILATION AND CURETTAGE OF UTERUS      EGD, COLONOSCOPY N/A 04/21/2020    Procedure: EGD, COLONOSCOPY;  Surgeon: Lisa Vinie SAUNDERS, MD;  Location: [facility];  Service: Gastroenterology;  Laterality: N/A;  EGD, COLONOSCOPY  Asst=N    EYE SURGERY Bilateral     cataract    TONSILECTOMY, ADENOIDECTOMY, BILATERAL MYRINGOTOMY AND TUBES      UPPER GASTROINTESTINAL ENDOSCOPY  04/24/2018           Family History:    Family History   Problem Relation Name Age of Onset  Heart failure Mother      Osteoporosis Mother      Thyroid disease Mother      Hyperlipidemia Mother      Heart failure Father      Myocardial Infarction Father      Coronary artery disease Father      Stent  Father      Hyperlipidemia Father      Myocardial Infarction Maternal Grandfather      Myocardial Infarction Paternal Grandfather  43    Thyroid disease Sister      Hyperlipidemia Sister      Hyperlipidemia Brother      Sudden death Brother          cardiac    Kidney disease Brother      Asthma Brother      Thyroid disease Daughter      Hyperlipidemia Daughter             Social History:    Social History     Tobacco Use    Smoking status: Never    Smokeless tobacco: Never   Vaping Use    Vaping status: Never Used   Substance Use Topics    Alcohol use: Yes     Alcohol/week: 0.0 - 1.0 standard drinks of alcohol     Comment: rare    Drug use: Never           The following sections were reviewed this encounter by the provider:   Tobacco  Allergies  Meds  Problems  Med Hx  Surg Hx  Fam Hx               Vital Signs:    There were no vitals taken for this visit.         ROS:    Review of Systems   Denies fevers, chills  Denies unintentional weight loss, night sweats  Denies saddle anesthesia or inability to control bowel or bladder, numbness, tingling, weakness    No h/o rheumatoid arthritis, current fractures or infection, cancer, severe osteoporosis          Physical Exam:    PEx:   General: well-nourished, well-developed, in NAD, well appearing   Lungs: breathing comfortably on room air, non-labored breathing, able to complete full sentences without issue   HENT: clear conjunctiva, EOMI   Neuro: awake, alert and oriented x3  Psych: clean, well groomed, clear speech, normal volume, normal eye contact, cooperative and friendly, logical and goal directed thought process  OMM:   Normal gait    Gross motion testing:   FROM with flexion/extension, SB and rotation of the C-spine   FROM with flexion extension of lumbar spine, SB and rotation of T spine     MUSCULOSKELETAL: Spinal curve deviation noted. Sacrum twisted to the left. Right hip higher than left. Tightness in upper back.    Neuromuscular testing  sensation  equal BL LE, strength BL LE 5/5  sensation equal BL UE, strength BL UE 5/5    Spinal tenderness: none  Paraspinal tenderness: thoracic, lumbar     Seated flexion test: L side positive  Spring test: Positive  Sacrum: E RLSR    Standing flexion test: R side positive  Leg length: L medial malleolus inferior compares to R    Innominate: R superior inflare    Muscular changes:   Trapezius: bilateral  Rhomboids (Major and Minor): bilateral  Latissimus Dorsi: bilateral  Serratus Posterior: bilateral  Erector Spinae: bilateral  Intercostal Muscles:  bilateral  Quadratus Lumborum: right and bilateral  Multifidus: right  Iliopsoas: right and bilateral    Treatments:   Soft Tissue  MFR-- parallel traction and kneading and direct inhibitory pressure  Direct Muscle energy  Counterstrain  Shotgun technique of the pelvis  Muscle energy for right short leg, right innominate in flare  Sacral rocking  Respiratory assist          Assessment/Plan:    1. Chronic bilateral thoracic back pain  - XR Thoracic Spine AP Lateral And Obliques; Future    2. Somatic dysfunction of thoracic region    3. Somatic dysfunction of lumbar region    4. Somatic dysfunction of pelvis region    5. Somatic dysfunction of sacral region    Reviewed the risks and benefits of osteopathic manipulative treatment (risks including acute worsening pain) and discussed assessment and treatment goals of each technique utilized in clinic today including: myofascial release and muscle energy     Diagnosis and Treatment:    Patient tolerated well with improvement in somatic dysfunction and/or symptomatic relief      Patient tolerated the procedure well.  Reviewed with patient the potential side effects including acute worsening of pain in the 24-48 hours after manipulative therapy. This can be treated with gentle stretches.  Patient expressed understanding of treatment goals including improved body mechanics and proper musculoskeletal alignment.          Patient  Instructions:    Patient Instructions     It was a pleasure meeting you today for your initial OMM visit.      As we discussed osteopathic manipulative medicine (OMM) as an alternative modality for the treatment of multiple conditions including musculoskeletal pain.   -- Similar to physical therapy, medications, injections, and chiropractic therapy, OMM is not for everybody and everyone doesn't respond in the same way. In addition, similar to PT and chiropractic therapy, multiple treatments are often needed to see full benefit.    -- It also does not serve as a replacement for other treatments, and a multi-modal approach (multiple different treatments in conjunction) may be the most appropriate.    -- Our goal is not 100% elimination of pain, but making pain tolerable with the goal of increasing function from day to day.    -- If desired to continue, please return at your desired interval (typically 2-4 weeks)    -- If desired you can also see Drs. Massman or Tsapos who perform OMM and sometimes maybe in different ways. In the same way different therapists use different techniques, you may benefit from seeing different physians who may use different techniques.      Today we performed soft tissue release and muscle energy.   We reviewed the potential side effects of acute worsening in the 24-48 hours after manipulative therapy which can be treated with gentle stretches and Tylenol.  Be sure to stay well hydrated post procedure.  We talked about the benefits of stretching and yoga as well.   Please follow up in 1-2 weeks or sooner as needed.            Follow-up:    Return in about 4 weeks (around 04/22/2024) for OMT.      This encounter took at total of 51 minutes which include pre-charting, face to face, reviewing records, discussion and coordination of care and minus any separately reportable procedures.   Zlaty Alexa Everitt Clint Kill, DO

## 2024-03-25 ENCOUNTER — Ambulatory Visit (INDEPENDENT_AMBULATORY_CARE_PROVIDER_SITE_OTHER): Payer: BLUE CROSS/BLUE SHIELD | Admitting: Student in an Organized Health Care Education/Training Program

## 2024-03-25 ENCOUNTER — Ambulatory Visit (INDEPENDENT_AMBULATORY_CARE_PROVIDER_SITE_OTHER): Payer: BLUE CROSS/BLUE SHIELD

## 2024-03-25 ENCOUNTER — Encounter (INDEPENDENT_AMBULATORY_CARE_PROVIDER_SITE_OTHER): Payer: Self-pay | Admitting: Student in an Organized Health Care Education/Training Program

## 2024-03-25 DIAGNOSIS — M9904 Segmental and somatic dysfunction of sacral region: Secondary | ICD-10-CM

## 2024-03-25 DIAGNOSIS — M9903 Segmental and somatic dysfunction of lumbar region: Secondary | ICD-10-CM

## 2024-03-25 DIAGNOSIS — M9905 Segmental and somatic dysfunction of pelvic region: Secondary | ICD-10-CM

## 2024-03-25 DIAGNOSIS — M9902 Segmental and somatic dysfunction of thoracic region: Secondary | ICD-10-CM

## 2024-03-25 DIAGNOSIS — G8929 Other chronic pain: Secondary | ICD-10-CM

## 2024-03-25 DIAGNOSIS — M546 Pain in thoracic spine: Secondary | ICD-10-CM

## 2024-03-25 NOTE — Patient Instructions (Addendum)
 It was a pleasure meeting you today for your initial OMM visit.      As we discussed osteopathic manipulative medicine (OMM) as an alternative modality for the treatment of multiple conditions including musculoskeletal pain.   -- Similar to physical therapy, medications, injections, and chiropractic therapy, OMM is not for everybody and everyone doesn't respond in the same way. In addition, similar to PT and chiropractic therapy, multiple treatments are often needed to see full benefit.    -- It also does not serve as a replacement for other treatments, and a multi-modal approach (multiple different treatments in conjunction) may be the most appropriate.    -- Our goal is not 100% elimination of pain, but making pain tolerable with the goal of increasing function from day to day.    -- If desired to continue, please return at your desired interval (typically 2-4 weeks)    -- If desired you can also see Drs. Massman or Tsapos who perform OMM and sometimes maybe in different ways. In the same way different therapists use different techniques, you may benefit from seeing different physians who may use different techniques.      Today we performed soft tissue release and muscle energy.   We reviewed the potential side effects of acute worsening in the 24-48 hours after manipulative therapy which can be treated with gentle stretches and Tylenol.  Be sure to stay well hydrated post procedure.  We talked about the benefits of stretching and yoga as well.   Please follow up in 1-2 weeks or sooner as needed.

## 2024-03-27 ENCOUNTER — Other Ambulatory Visit (INDEPENDENT_AMBULATORY_CARE_PROVIDER_SITE_OTHER): Payer: Self-pay | Admitting: Family Medicine

## 2024-03-27 DIAGNOSIS — E7849 Other hyperlipidemia: Secondary | ICD-10-CM

## 2024-03-31 ENCOUNTER — Ambulatory Visit (INDEPENDENT_AMBULATORY_CARE_PROVIDER_SITE_OTHER): Payer: Self-pay | Admitting: Student in an Organized Health Care Education/Training Program

## 2024-03-31 DIAGNOSIS — M4004 Postural kyphosis, thoracic region: Secondary | ICD-10-CM | POA: Insufficient documentation

## 2024-03-31 DIAGNOSIS — M4134 Thoracogenic scoliosis, thoracic region: Secondary | ICD-10-CM | POA: Insufficient documentation

## 2024-04-21 NOTE — Progress Notes (Unsigned)
 [facility]   OFFICE VISIT  [facility]   {Vanishing Tip Click a link below to be taken to that activity or part of the chart   Chart Review  Order Review  Review Flowsheets  Labs  Health Maintenance  Immunizations  Allergies  Medications  Problem List  History  Synopsis :55325}           Date of Exam: 04/22/2024 5:47 PM        Patient ID: [facility] is a 73 y.o. female.  Attending Physician: Eilene Everitt Clint Abbey, DO        Chief Complaint:    No chief complaint on file.          HPI:   Patient presents for evaluation of ***.  After discussion of risks/benefits, decided to proceed with OMT     Updates:   This SmartLink is not supported for fields with SmartSections disabled.      Per previous note:  Victoria Fields is a 73 year old female with polyneuropathy, lumbar radiculopathy, and osteopenia who presents with back pain.     She experiences back pain affecting both her lower and upper back. The lower back pain has persisted for two and a half to three years, initially triggered by helping her daughter move into a three-story townhouse, leading to shooting pain down her right leg. She receives steroid injections for this pain, which provide temporary relief, and recently tried nerve block injections that lasted about 36 hours.     Her upper back pain developed within the last year, located in the 'bra band area' or slightly higher, especially after prolonged standing or walking. She notes a decrease in height from 5'10 to 5'8, with a family history of significant height loss in her mother and cousins.     She exercises daily, including using a Land, and stretches regularly. Despite these efforts, stretching does not significantly alleviate her pain. She is able to touch her toes and considers herself limber for her age.     She has a history of polyneuropathy, lumbar radiculopathy, and osteopenia. She takes Lyrica for numbness and nerve pain in  her feet, but it does not alleviate her back pain. She avoids Advil and Naprosyn due to kidney disease but does occasionally take it to help her sleep. She finds Tylenol ineffective for pain relief.     She has a history of a broken tailbone, attributed to childbirth about 38 yrs ago, and has undergone physical therapy for it. She also mentions a broken bone in her foot a few years ago. No history of rheumatoid arthritis, but acknowledges having arthritis due to age.          Problem List:    Patient Active Problem List   Diagnosis   . Allergic rhinitis   . Barrett's esophagus   . Alport syndrome-like hereditary nephritis   . Benign essential hypertension   . Benign neoplasm of thyroid gland   . Hyperlipidemia   . Hyperuricemia   . Hypothyroidism   . Impaired fasting glucose   . Kidney stone   . Obesity with body mass index 30 or greater   . Obstructive sleep apnea syndrome   . Osteopenia   . Reactive airway disease   . Mitral valve regurgitation   . Urine test positive for microalbuminuria   . Screening for malignant neoplasm of breast   . Fatty liver   . Vitamin B12 deficiency   .  Polyneuropathy associated with underlying disease   . Other migraine without status migrainosus, not intractable   . Lumbar radiculopathy   . Stage 3b chronic kidney disease (CMS/HCC)   . Postural kyphosis of thoracic region   . Thoracogenic scoliosis of thoracic region             Current Meds:    No outpatient medications have been marked as taking for the 04/22/24 encounter (Appointment) with Everitt Clint Kill, Eilene, DO.          Allergies:    Allergies   Allergen Reactions   . Cephalosporins Anaphylaxis   . Strawberry Extract Hives   . Cefuroxime    . Dust Mite Extract    . Molds & Smuts    . Niacin Itching   . Pollen Extract    . Tree Extract              Past Surgical History:    Past Surgical History:   Procedure Laterality Date   . CATARACT EXTRACTION  11/16/2008   . COLONOSCOPY, DIAGNOSTIC (SCREENING)  02/17/2015   . COLONOSCOPY,  DIAGNOSTIC (SCREENING)  08/08/2007   . DILATION AND CURETTAGE OF UTERUS     . EGD, COLONOSCOPY N/A 04/21/2020    Procedure: EGD, COLONOSCOPY;  Surgeon: Lisa Vinie SAUNDERS, MD;  Location: [facility];  Service: Gastroenterology;  Laterality: N/A;  EGD, COLONOSCOPY  Asst=N   . EYE SURGERY Bilateral     cataract   . TONSILECTOMY, ADENOIDECTOMY, BILATERAL MYRINGOTOMY AND TUBES     . UPPER GASTROINTESTINAL ENDOSCOPY  04/24/2018           Family History:    Family History   Problem Relation Name Age of Onset   . Heart failure Mother     . Osteoporosis Mother     . Thyroid disease Mother     . Hyperlipidemia Mother     . Heart failure Father     . Myocardial Infarction Father     . Coronary artery disease Father     . Stent Father     . Hyperlipidemia Father     . Myocardial Infarction Maternal Grandfather     . Myocardial Infarction Paternal Grandfather  60   . Thyroid disease Sister     . Hyperlipidemia Sister     . Hyperlipidemia Brother     . Sudden death Brother          cardiac   . Kidney disease Brother     . Asthma Brother     . Thyroid disease Daughter     . Hyperlipidemia Daughter             Social History:    Social History     Tobacco Use   . Smoking status: Never   . Smokeless tobacco: Never   Vaping Use   . Vaping status: Never Used   Substance Use Topics   . Alcohol use: Yes     Alcohol/week: 0.0 - 1.0 standard drinks of alcohol     Comment: rare   . Drug use: Never           The following sections were reviewed this encounter by the provider:              Vital Signs:    There were no vitals taken for this visit.         ROS:    Review of Systems   Denies fevers, chills  Denies  unintentional weight loss, night sweats  Denies saddle anesthesia or inability to control bowel or bladder, numbness, tingling, weakness    No h/o rheumatoid arthritis, current fractures or infection, cancer, severe osteoporosis         Physical Exam:   PEx:   General: well-nourished, well-developed, in NAD, well appearing   Lungs:  breathing comfortably on room air, non-labored breathing, able to complete full sentences without issue   HENT: clear conjunctiva, EOMI   Neuro: awake, alert and oriented x3  Psych: clean, well groomed, clear speech, normal volume, normal eye contact, cooperative and friendly, logical and goal directed thought process  OMM:   Normal gait    Gross motion testing:   FROM with flexion/extension, SB and rotation of the C-spine   FROM with flexion extension of lumbar spine, SB and rotation of T spine     Spinal tenderness: none  Paraspinal tenderness: ***     This SmartLink is not supported for fields with SmartSections disabled.      Cervical Spine:   OA ***  AA ***  Typical Cervicals: ***    Ribs: ***    Thoracic Spine: ***    Lumbar Spine: ***    Seated flexion test: *** side positive  Spring test: {POSITIVE/NEGATIVE:23097}  Sacrum: ***    Standing flexion test: *** side positive  Leg length: *** medial malleolus inferior compares to ***    Innominate: ***    Muscular changes: ***      Treatments:   Soft Tissue  MFR-- parallel traction and kneading and direct inhibitory pressure  Direct Muscle energy  ***    This SmartLink is not supported for fields with SmartSections disabled.            Assessment/Plan:    There are no diagnoses linked to this encounter.  Reviewed the risks and benefits of osteopathic manipulative treatment (risks including acute worsening pain) and discussed assessment and treatment goals of each technique utilized in clinic today including: myofascial release and muscle energy     Diagnosis and Treatment:    Patient tolerated well with improvement in somatic dysfunction and/or symptomatic relief      Patient tolerated the procedure well.  Reviewed with patient the potential side effects including acute worsening of pain in the 24-48 hours after manipulative therapy. This can be treated with gentle stretches.  Patient expressed understanding of treatment goals including improved body mechanics and  proper musculoskeletal alignment.          Patient Instructions:    There are no Patient Instructions on file for this visit.        Follow-up:    No follow-ups on file.      This encounter took at total of *** minutes which include pre-charting, face to face, reviewing records, discussion and coordination of care and minus any separately reportable procedures.      Gal Feldhaus Everitt Clint Kill, DO

## 2024-04-22 ENCOUNTER — Ambulatory Visit (INDEPENDENT_AMBULATORY_CARE_PROVIDER_SITE_OTHER): Payer: BLUE CROSS/BLUE SHIELD | Admitting: Student in an Organized Health Care Education/Training Program

## 2024-04-22 ENCOUNTER — Encounter (INDEPENDENT_AMBULATORY_CARE_PROVIDER_SITE_OTHER): Payer: Self-pay | Admitting: Student in an Organized Health Care Education/Training Program

## 2024-04-22 VITALS — BP 115/69

## 2024-04-22 DIAGNOSIS — M546 Pain in thoracic spine: Secondary | ICD-10-CM

## 2024-04-22 DIAGNOSIS — M4134 Thoracogenic scoliosis, thoracic region: Secondary | ICD-10-CM

## 2024-04-22 DIAGNOSIS — M9902 Segmental and somatic dysfunction of thoracic region: Secondary | ICD-10-CM

## 2024-04-22 DIAGNOSIS — M9901 Segmental and somatic dysfunction of cervical region: Secondary | ICD-10-CM

## 2024-04-22 DIAGNOSIS — G8929 Other chronic pain: Secondary | ICD-10-CM

## 2024-04-22 DIAGNOSIS — M9908 Segmental and somatic dysfunction of rib cage: Secondary | ICD-10-CM

## 2024-05-02 ENCOUNTER — Encounter (INDEPENDENT_AMBULATORY_CARE_PROVIDER_SITE_OTHER): Payer: Self-pay | Admitting: Family Medicine

## 2024-05-26 ENCOUNTER — Other Ambulatory Visit (INDEPENDENT_AMBULATORY_CARE_PROVIDER_SITE_OTHER): Payer: BLUE CROSS/BLUE SHIELD

## 2024-05-26 ENCOUNTER — Ambulatory Visit (INDEPENDENT_AMBULATORY_CARE_PROVIDER_SITE_OTHER): Payer: Self-pay | Admitting: Family Medicine

## 2024-05-26 DIAGNOSIS — R7301 Impaired fasting glucose: Secondary | ICD-10-CM

## 2024-05-26 DIAGNOSIS — I10 Essential (primary) hypertension: Secondary | ICD-10-CM

## 2024-05-26 DIAGNOSIS — K760 Fatty (change of) liver, not elsewhere classified: Secondary | ICD-10-CM

## 2024-05-26 DIAGNOSIS — E7849 Other hyperlipidemia: Secondary | ICD-10-CM

## 2024-05-26 DIAGNOSIS — N1832 Chronic kidney disease, stage 3b: Secondary | ICD-10-CM

## 2024-05-26 LAB — COMPREHENSIVE METABOLIC PANEL
ALT: 31 U/L (ref <=55)
AST (SGOT): 39 U/L (ref <=41)
Albumin/Globulin Ratio: 1.4 (ref 0.9–2.2)
Albumin: 3.6 g/dL (ref 3.5–5.0)
Alkaline Phosphatase: 92 U/L (ref 37–117)
Anion Gap: 11 (ref 5.0–15.0)
BUN: 25 mg/dL — ABNORMAL HIGH (ref 7–21)
Bilirubin, Total: 0.6 mg/dL (ref 0.2–1.2)
CO2: 24 meq/L (ref 17–29)
Calcium: 10 mg/dL (ref 7.9–10.2)
Chloride: 107 meq/L (ref 99–111)
Creatinine: 1.8 mg/dL — ABNORMAL HIGH (ref 0.4–1.0)
GFR: 29.2 mL/min/1.73 m2 — ABNORMAL LOW (ref >=60.0)
Globulin: 2.6 g/dL (ref 2.0–3.6)
Glucose: 82 mg/dL (ref 70–100)
Hemolysis Index: 31 {index}
Potassium: 4.8 meq/L (ref 3.5–5.3)
Protein, Total: 6.2 g/dL (ref 6.0–8.3)
Sodium: 142 meq/L (ref 135–145)

## 2024-05-26 LAB — LIPID PANEL
Cholesterol / HDL Ratio: 3 {index}
Cholesterol: 188 mg/dL (ref <=199)
HDL: 62 mg/dL (ref >=40)
LDL Calculated: 106 mg/dL — ABNORMAL HIGH (ref 0–99)
Triglycerides: 100 mg/dL (ref 34–149)
VLDL Calculated: 20 mg/dL (ref 10–40)

## 2024-05-26 LAB — HEMOGLOBIN A1C
Average Estimated Glucose: 114 mg/dL
Hemoglobin A1C: 5.6 % (ref 4.6–5.6)

## 2024-05-26 NOTE — Progress Notes (Signed)
 Subjective:      Patient ID: [facility] is a 73 y.o. female     Chief Complaint   Patient presents with    Hypertension    Annual Exam    Hyperlipidemia    Chronic Kidney Disease        She is seen for well visit and ch care recheck.  Was seen by me last in 11/2023.    Smoking: in college.  Alcohol: not much.  Diet: balanced  Exercise: regualrly  PAP with HPV was done in 02/2017.  Mammo 11/2023, [facility].  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 Lisa.  HCV neg.  Immuniz: UTD  FH of colon, breast, ovary, skin CA: no.  FH of DM, heart d: brother passed away with MI in 12-08-2015. He had PKD and was on dialysis. Daughter with Alport synd.    For ch care:  1) Periph neuropathy  On Lyrica 50 bid-tid, and notes improvement.  Gabapentin caused excessive lethargy.  Originally noted when she had bleeding on her foot that she did not feel.  NCS 01/2020 showed mildly progressed sensory and motor neuropathy bilat LE's.  Labs 01/2020: low B12 at 281, now on suppl.  Homocysteine 16 and mildly elevated.  SPEP showing hypogammaglobulinemia. ANA neg.  B12 1216 in 08/2020, but homocysteine still at 17.4.  NCS 07/2022 showed same mild axonal sensory motor peripheral neuropathy, but new mild left S1 radiculopathy.  MRI L-spine 08/2022, L4/5 spondylolisthesis.  Neuro, Dr. Roselynn, visit 12/2023 reviewed.  Pain Management visit 07/2023, had inj.    2) Migraine  Took Imitrex or Relpax but had severe fatigue from taking them.   Nurtec works well.SABRAHas been doing well on it.  None in 6 months.    3) Cognitive impairment, word finding difficulty (likely anxiety related)  MRI brain 08/2023, mild chronic microangiopathic changes and mild generalized volume loss, neuroquant analysis demonstrates normal hippocampal and ventricular volumes.    4) OSA  Mod non-positional OSA per home testing 01/2018. Was referred for titration study, but she can't keep CPAP on.    5) Hypertension:  On Lisinopril  2.5 mg (reduced gradually from 20 mg).   Cartia XT 120 held by  Dr. Musio. Off Amlodipine.  EST in 02/2015 nl.   Echo 01/2015, mild LVH and tr tricuspid regurg and mild aortic valve sclerosis without stenosis.  Echo 05/2023, LVEF 60%, mild AS and PR.  EST 05/2023.  Saw Dr. Leigh 05/2023.  Home BP:     6) Hyperlipidemia, fatty liver  On Zetia  10, Crestor  20, Lovaza  2 gm. Off Simvastatin  60.  Lipids: 232/124/61/149 in 02/2020, 185/148/54/101 in 12/07/17, 214/212/47/125 in 2016/12/07.  Lab Results       Component                Value               Date                       CHOL                     188                 05/26/2024                 CHOL                     170  06/25/2023                 CHOL                     198                 05/25/2022            Lab Results       Component                Value               Date                       HDL                      62                  05/26/2024                 HDL                      53                  06/25/2023                 HDL                      50                  05/25/2022            Lab Results       Component                Value               Date                       LDL                      106 (H)             05/26/2024                 LDL                      96                  06/25/2023                 LDL                      115 (H)             05/25/2022            Lab Results       Component                Value               Date                       TRIG                     100  05/26/2024                 TRIG                     103                 06/25/2023                 TRIG                     164 (H)             05/25/2022            LFT nl now.    7) CKD 3, renal cyst, Alports synd  On Na Bicarb. Farxiga was started in 10/2022 per Nephro.  Abd/pelvic CT 2012 left renal cyst 5 cm.  Renal sono 03/2017, exophytic cyst x 2 largest at 8 cm.  24 hr urine pro 288, cr cl 49 in 03/2018.   Urine microalbumin ratio 1115 in 04/2019, 1459 in 09/2019, 230 in 09/2023, 131 in  03/2024.  U/a WBC 11-30, cx neg 09/2023.  BUN/Creat 28/1.46 in 05/2016.  28/2.08/25 in 03/2022 while ill,  20/1.4/40 in 09/2022,  34/1.59/35 in 09/2023.  35/1.79/30, K 5.5 in 03/2024,  25/1.8/29, K 4.8 now.  CBC H/H 13.5/40.8 in 09/2021, 11.6/35.1 in 03/2022, 14.2/43.8 in 09/2023, 12.7/40.1 in 03/2024.  PTH 70 in 09/2021, 82 in 03/2022, 49 in 09/2022, 77 in 09/2023, 95 in 03/2024.  Nephro, Dr. Kristi, 03/2024 note reviewed.     8) Hyperuricemia, kidney stone  On Allopurinol  200 (Uloric not covered by ins).  Uric acid: 4.7 in 12/2017, 3.9 in 03/2018, 5.1 in 01/2021.    9) Hypothyroidism:  Currently taking 50 mcg of brand name Synthroid  daily.  TSH in a good range 10/2023.  Sono 12/2017, nodular thyroid without change.    10) IFG  A1C 6.0 in 10/2016, 5.8 in 03/2018, 6.4 in 09/2021, 6.3 in 03/2023, 5.5 in 06/2023, 5.6 now.    11) GERD, Barrett's since 2013  On Pepcid 40 qd and Prevacid qd. Off Nexium (due to nausea).  EGD 04/2018 Dr. Drue Cramp, no Barretts, but + esophagitis despite Prevacid.  Update EGD 04/2020, Dr. Lisa, no Barretts, but [facility].  GI, Dr. Cramp, visit 04/2024.  Symptoms:     12) Exercise induced asthma, allergy  Xyzal , Flonase , Flovent  PRN, Singulair .  Was given Qvar in place of Flovent .  Has bad taste in mouth, difficult to use.  Was seen on 08/2022 for recurring vertigo.  Meclizine helped.  Ill with sinus and resp symptoms x 2 in 2023.    13) Lip lesion benign per ENT 06/2023    14) Osteopenia  DEXA T score -2.0 fem neck in 12/2022, -1.8 in 09/2019, -1.7 in 05/2017, -1.5 in 2011.  Vit D 46 in 09/2022.    15) MSK pain  -Left shoulder rotator cuff tendinopathy 12/2019. Saw Sports Med and had inj. Had PT.   -Left lateral knee and hip pain   -Recurring tailbone pain. Was referred to PT  -S/p left foot neuroma and right 1st MTP fx, saw Dr. Ewing 04/2021.  -LBP, had OMM 7-04/2024.    16) BMI>30  Started on Wegovy  in 01/2022 and did well, but was not able to continue due to shortage.  Tried to fill rx's for multiple other GLP-1's  unsuccessfully.  Back on Wegovy , dose up to 1.7 mg and was feeling the  appetite suppression.  Had constipation as well as GERD.  Backed off to 1 mg q 2 weeks now.  Weight change: 213 in 12/2019.  Wt Readings from Last 3 Encounters:  06/04/24 : 78 kg (172 lb)  11/21/23 : 80.3 kg (177 lb)  03/19/23 : 90.7 kg (200 lb)  09/06/22 : 90.1 kg (198 lb 9.6 oz)  08/29/22 : 90.7 kg (200 lb)            The following sections were reviewed this encounter by the provider:   Tobacco  Allergies  Meds  Problems  Med Hx  Surg Hx  Fam Hx         Review of Systems       BP 129/72   Pulse 85   Temp 98 F (36.7 C)   Wt 78 kg (172 lb)   BMI 26.15 kg/m     Objective:     Physical Exam  Constitutional:       General: She is not in acute distress.     Appearance: Normal appearance. She is not ill-appearing.   HENT:      Head: Normocephalic and atraumatic.      Nose: Nose normal.   Eyes:      Extraocular Movements: Extraocular movements intact.      Conjunctiva/sclera: Conjunctivae normal.   Cardiovascular:      Rate and Rhythm: Regular rhythm.      Heart sounds: Murmur heard.   Pulmonary:      Effort: Pulmonary effort is normal. No respiratory distress.   Musculoskeletal:         General: Normal range of motion.   Neurological:      General: No focal deficit present.      Mental Status: She is alert and oriented to person, place, and time.      Cranial Nerves: No cranial nerve deficit.      Gait: Gait normal.   Psychiatric:         Mood and Affect: Mood normal.         Behavior: Behavior normal.          Assessment:     1. Well adult exam    2. Benign essential hypertension  - semaglutide  (Wegovy ) 1 MG/0.5ML injection; Inject 0.5 mLs (1 mg) into the skin once a week  Dispense: 2 mL; Refill: 6    3. Stage 3b chronic kidney disease (CMS/HCC)    4. Other hyperlipidemia  - omega-3 acid ethyl esters (LOVAZA ) 1 g capsule; Take 1 capsule (1 g) by mouth 2 (two) times daily  Dispense: 180 capsule; Refill: 3  - rosuvastatin  (CRESTOR ) 20 MG  tablet; Take 1 tablet (20 mg) by mouth once daily  Dispense: 90 tablet; Refill: 3  - ezetimibe  (ZETIA ) 10 MG tablet; Take 1 tablet (10 mg) by mouth once daily  Dispense: 90 tablet; Refill: 3  - semaglutide  (Wegovy ) 1 MG/0.5ML injection; Inject 0.5 mLs (1 mg) into the skin once a week  Dispense: 2 mL; Refill: 6    5. Impaired fasting glucose  - semaglutide  (Wegovy ) 1 MG/0.5ML injection; Inject 0.5 mLs (1 mg) into the skin once a week  Dispense: 2 mL; Refill: 6    6. Fatty liver  - semaglutide  (Wegovy ) 1 MG/0.5ML injection; Inject 0.5 mLs (1 mg) into the skin once a week  Dispense: 2 mL; Refill: 6    7. Other specified hypothyroidism  - Synthroid  50 MCG tablet; TAKE 1 TABLET ONCE DAILY AT6:00AM  Dispense: 90 tablet; Refill: 3  - TSH; Future    8. Non-seasonal allergic rhinitis due to other allergic trigger  - levocetirizine (XYZAL ) 5 MG tablet; Take 1 tablet (5 mg) by mouth once every evening  Dispense: 90 tablet; Refill: 3    9. Obstructive sleep apnea syndrome  - semaglutide  (Wegovy ) 1 MG/0.5ML injection; Inject 0.5 mLs (1 mg) into the skin once a week  Dispense: 2 mL; Refill: 6    10. Mild intermittent reactive airway disease without complication    11. Hyperuricemia  - allopurinol  (ZYLOPRIM ) 100 MG tablet; TAKE 2 TABLETS (200MG       TOTAL) DAILY  Dispense: 180 tablet; Refill: 3    12. Lumbar radiculopathy    13. Obesity with body mass index 30 or greater  - semaglutide  (Wegovy ) 1 MG/0.5ML injection; Inject 0.5 mLs (1 mg) into the skin once a week  Dispense: 2 mL; Refill: 6    14. Encounter for vaccination  - Flu vaccine TRIVALENT, 65 yrs and older (FLUZONE  HIGH-DOSE) single-dose PF, 0.5 mL          Plan:     May stop Lisinopril , Pantoprazole, Singulair .    Continue Wegovy  but at less frequency.    Please also continue working on quality low fat low sugar diet as well as regular exercises.     Monitor BP at home.  Desirable range of BP is 110/70-135/85  Please call if consistently out of range.    Please have this  blood test done.  Fasting is not necessary.    Review of records and visit took place over 35 min aside from well visit.    Trecia Maring CINDERELLA Oto, MD

## 2024-05-26 NOTE — Progress Notes (Signed)
 There is a bit of progression in your kidney dysfunction.  Please be sure to take plenty of fluids.  Will discuss.

## 2024-06-04 ENCOUNTER — Encounter (INDEPENDENT_AMBULATORY_CARE_PROVIDER_SITE_OTHER): Payer: Self-pay | Admitting: Family Medicine

## 2024-06-04 ENCOUNTER — Other Ambulatory Visit (INDEPENDENT_AMBULATORY_CARE_PROVIDER_SITE_OTHER): Payer: Self-pay | Admitting: Family Medicine

## 2024-06-04 ENCOUNTER — Ambulatory Visit (INDEPENDENT_AMBULATORY_CARE_PROVIDER_SITE_OTHER): Payer: BLUE CROSS/BLUE SHIELD | Admitting: Family Medicine

## 2024-06-04 VITALS — BP 129/72 | HR 85 | Temp 98.0°F | Wt 172.0 lb

## 2024-06-04 DIAGNOSIS — I10 Essential (primary) hypertension: Secondary | ICD-10-CM

## 2024-06-04 DIAGNOSIS — K760 Fatty (change of) liver, not elsewhere classified: Secondary | ICD-10-CM

## 2024-06-04 DIAGNOSIS — J4520 Mild intermittent asthma, uncomplicated: Secondary | ICD-10-CM

## 2024-06-04 DIAGNOSIS — E790 Hyperuricemia without signs of inflammatory arthritis and tophaceous disease: Secondary | ICD-10-CM

## 2024-06-04 DIAGNOSIS — N1832 Chronic kidney disease, stage 3b: Secondary | ICD-10-CM

## 2024-06-04 DIAGNOSIS — Z23 Encounter for immunization: Secondary | ICD-10-CM

## 2024-06-04 DIAGNOSIS — Z0000 Encounter for general adult medical examination without abnormal findings: Secondary | ICD-10-CM

## 2024-06-04 DIAGNOSIS — M5416 Radiculopathy, lumbar region: Secondary | ICD-10-CM

## 2024-06-04 DIAGNOSIS — E7849 Other hyperlipidemia: Secondary | ICD-10-CM

## 2024-06-04 DIAGNOSIS — R7301 Impaired fasting glucose: Secondary | ICD-10-CM

## 2024-06-04 DIAGNOSIS — J3089 Other allergic rhinitis: Secondary | ICD-10-CM

## 2024-06-04 DIAGNOSIS — G4733 Obstructive sleep apnea (adult) (pediatric): Secondary | ICD-10-CM

## 2024-06-04 DIAGNOSIS — E669 Obesity, unspecified: Secondary | ICD-10-CM

## 2024-06-04 DIAGNOSIS — E038 Other specified hypothyroidism: Secondary | ICD-10-CM

## 2024-06-04 MED ORDER — ROSUVASTATIN CALCIUM 20 MG PO TABS
20.0000 mg | ORAL_TABLET | Freq: Every day | ORAL | 3 refills | Status: DC
Start: 2024-06-04 — End: 2024-06-10

## 2024-06-04 MED ORDER — SEMAGLUTIDE-WEIGHT MANAGEMENT 1 MG/0.5ML SC SOAJ
1.0000 mg | SUBCUTANEOUS | 6 refills | Status: DC
Start: 2024-06-04 — End: 2024-06-10

## 2024-06-04 MED ORDER — SEMAGLUTIDE-WEIGHT MANAGEMENT 1 MG/0.5ML SC SOAJ
1.0000 mg | SUBCUTANEOUS | 6 refills | Status: DC
Start: 2024-06-04 — End: 2024-06-04

## 2024-06-04 MED ORDER — ALLOPURINOL 100 MG PO TABS
ORAL_TABLET | ORAL | 3 refills | Status: DC
Start: 2024-06-04 — End: 2024-06-10

## 2024-06-04 MED ORDER — OMEGA-3-ACID ETHYL ESTERS 1 G PO CAPS
1.0000 | ORAL_CAPSULE | Freq: Two times a day (BID) | ORAL | 3 refills | Status: AC
Start: 2024-06-04 — End: ?

## 2024-06-04 MED ORDER — SYNTHROID 50 MCG PO TABS
ORAL_TABLET | ORAL | 3 refills | Status: DC
Start: 2024-06-04 — End: 2024-06-10

## 2024-06-04 MED ORDER — LEVOCETIRIZINE DIHYDROCHLORIDE 5 MG PO TABS
5.0000 mg | ORAL_TABLET | Freq: Every evening | ORAL | 3 refills | Status: DC
Start: 2024-06-04 — End: 2024-06-10

## 2024-06-04 MED ORDER — EZETIMIBE 10 MG PO TABS
10.0000 mg | ORAL_TABLET | Freq: Every day | ORAL | 3 refills | Status: DC
Start: 2024-06-04 — End: 2024-06-10

## 2024-06-04 NOTE — Patient Instructions (Signed)
 May stop Lisinopril , Pantoprazole, Singulair .    Continue Wegovy  but at less frequency.    Please also continue working on quality low fat low sugar diet as well as regular exercises.     Monitor BP at home.  Desirable range of BP is 110/70-135/85  Please call if consistently out of range.    Please have this blood test done.  Fasting is not necessary.

## 2024-06-06 ENCOUNTER — Encounter (INDEPENDENT_AMBULATORY_CARE_PROVIDER_SITE_OTHER): Payer: Self-pay | Admitting: Family Medicine

## 2024-06-06 DIAGNOSIS — G4733 Obstructive sleep apnea (adult) (pediatric): Secondary | ICD-10-CM

## 2024-06-06 DIAGNOSIS — E669 Obesity, unspecified: Secondary | ICD-10-CM

## 2024-06-06 DIAGNOSIS — K760 Fatty (change of) liver, not elsewhere classified: Secondary | ICD-10-CM

## 2024-06-06 DIAGNOSIS — E790 Hyperuricemia without signs of inflammatory arthritis and tophaceous disease: Secondary | ICD-10-CM

## 2024-06-06 DIAGNOSIS — E7849 Other hyperlipidemia: Secondary | ICD-10-CM

## 2024-06-06 DIAGNOSIS — I10 Essential (primary) hypertension: Secondary | ICD-10-CM

## 2024-06-06 DIAGNOSIS — E038 Other specified hypothyroidism: Secondary | ICD-10-CM

## 2024-06-06 DIAGNOSIS — J3089 Other allergic rhinitis: Secondary | ICD-10-CM

## 2024-06-06 DIAGNOSIS — R7301 Impaired fasting glucose: Secondary | ICD-10-CM

## 2024-06-08 ENCOUNTER — Encounter (INDEPENDENT_AMBULATORY_CARE_PROVIDER_SITE_OTHER): Payer: Self-pay | Admitting: Family Medicine

## 2024-06-10 ENCOUNTER — Encounter (INDEPENDENT_AMBULATORY_CARE_PROVIDER_SITE_OTHER): Payer: Self-pay | Admitting: Family Medicine

## 2024-06-10 MED ORDER — SYNTHROID 50 MCG PO TABS
ORAL_TABLET | ORAL | 3 refills | Status: AC
Start: 2024-06-10 — End: ?

## 2024-06-10 MED ORDER — ALLOPURINOL 100 MG PO TABS
ORAL_TABLET | ORAL | 3 refills | Status: AC
Start: 2024-06-10 — End: ?

## 2024-06-10 MED ORDER — SEMAGLUTIDE-WEIGHT MANAGEMENT 1 MG/0.5ML SC SOAJ
1.0000 mg | SUBCUTANEOUS | 6 refills | Status: AC
Start: 2024-06-10 — End: ?

## 2024-06-10 MED ORDER — LEVOCETIRIZINE DIHYDROCHLORIDE 5 MG PO TABS
5.0000 mg | ORAL_TABLET | Freq: Every evening | ORAL | 3 refills | Status: AC
Start: 2024-06-10 — End: ?

## 2024-06-10 MED ORDER — ROSUVASTATIN CALCIUM 20 MG PO TABS
20.0000 mg | ORAL_TABLET | Freq: Every day | ORAL | 3 refills | Status: AC
Start: 2024-06-10 — End: ?

## 2024-06-10 MED ORDER — EZETIMIBE 10 MG PO TABS
10.0000 mg | ORAL_TABLET | Freq: Every day | ORAL | 3 refills | Status: AC
Start: 2024-06-10 — End: ?

## 2024-06-11 ENCOUNTER — Telehealth (INDEPENDENT_AMBULATORY_CARE_PROVIDER_SITE_OTHER): Payer: Self-pay

## 2024-06-11 NOTE — Telephone Encounter (Signed)
 Pharmacy calling with request for specifications re: Wegovy . Informed of MD note from last OV re: pt being on it initially in 2023, but d/c due to shortage. Informed she titrated down to 1mg  and has been on this since March from our records that I see.     Pharmacy requires most recent dose given that was 6 mo ago and what it was. Please clarify if this is correct or if there is a more recent dose either from our office or elswhere and contact pharmacy.

## 2024-06-11 NOTE — Telephone Encounter (Signed)
Left message for pharmacy informing

## 2024-06-12 ENCOUNTER — Other Ambulatory Visit (INDEPENDENT_AMBULATORY_CARE_PROVIDER_SITE_OTHER): Payer: Self-pay | Admitting: Family Medicine

## 2024-06-12 DIAGNOSIS — J3089 Other allergic rhinitis: Secondary | ICD-10-CM

## 2024-06-20 ENCOUNTER — Other Ambulatory Visit (INDEPENDENT_AMBULATORY_CARE_PROVIDER_SITE_OTHER): Payer: Self-pay | Admitting: Family Medicine

## 2024-06-20 DIAGNOSIS — E7849 Other hyperlipidemia: Secondary | ICD-10-CM

## 2024-06-20 DIAGNOSIS — R7301 Impaired fasting glucose: Secondary | ICD-10-CM

## 2024-06-20 DIAGNOSIS — G4733 Obstructive sleep apnea (adult) (pediatric): Secondary | ICD-10-CM

## 2024-06-20 DIAGNOSIS — E669 Obesity, unspecified: Secondary | ICD-10-CM

## 2024-06-20 DIAGNOSIS — K760 Fatty (change of) liver, not elsewhere classified: Secondary | ICD-10-CM

## 2024-06-20 DIAGNOSIS — I10 Essential (primary) hypertension: Secondary | ICD-10-CM

## 2024-07-29 ENCOUNTER — Ambulatory Visit (INDEPENDENT_AMBULATORY_CARE_PROVIDER_SITE_OTHER): Payer: BLUE CROSS/BLUE SHIELD | Admitting: Family

## 2024-07-29 ENCOUNTER — Encounter (INDEPENDENT_AMBULATORY_CARE_PROVIDER_SITE_OTHER): Payer: Self-pay | Admitting: Family

## 2024-07-29 VITALS — BP 138/78 | HR 73 | Temp 97.8°F | Resp 18 | Ht 68.0 in | Wt 171.0 lb

## 2024-07-29 DIAGNOSIS — J0100 Acute maxillary sinusitis, unspecified: Secondary | ICD-10-CM

## 2024-07-29 MED ORDER — DOXYCYCLINE HYCLATE 100 MG PO TABS
100.0000 mg | ORAL_TABLET | Freq: Two times a day (BID) | ORAL | 0 refills | Status: AC
Start: 2024-07-29 — End: 2024-08-05

## 2024-07-29 NOTE — Patient Instructions (Signed)
You were seen in clinic today for an acute sinus infection.  Please take antibiotics as prescribed.  You can take Flonase, Claritin, and Tylenol to help with your symptoms.  Rest, home, fluids.    Please return or go to the ER for any new or worsening symptoms that concern you.  Follow-up with your family doctor in 3 to 5 days.

## 2024-07-29 NOTE — Progress Notes (Signed)
 [facility]  OFFICE NOTE         Subjective   Historian: Patient      Chief Complaint   Patient presents with    Sinus Problem     Onset of sx began , c/o pain on the L side of face and pressure in the sinuses on the R side, Bil ear pain, post nasal drainage, Ibuprofen last taken at 1pm for ear pain        Sinus Problem      [name] is a 73 y.o. female who presents for nasal congestion, sinus pressure, and postnasal drainage for the last 2 weeks.  Started having worsening sinus pressure and bilateral ear pain onset yesterday.  Has taken over-the-counter allergy medicine and doing saline nasal rinses with minimal relief.  Denies any overt fevers, chest pain, wheezing, nausea, vomit, diarrhea, dizziness, or syncope.  States she did a home COVID test and was negative.    History:  Medications and Allergies reviewed.   Pertinent Past Medical, Surgical, Family and Social History were reviewed.        Objective     Vitals:    07/29/24 1723   BP: 138/78   Pulse: 73   Resp: 18   Temp: 97.8 F (36.6 C)   TempSrc: Tympanic   SpO2: 98%   Weight: 77.6 kg (171 lb)   Height: 1.727 m (5' 8)      Body mass index is 26 kg/m.          Physical Exam  Constitutional:       General: She is not in acute distress.     Appearance: Normal appearance. She is well-developed.   HENT:      Head: Normocephalic and atraumatic.      Right Ear: Hearing and tympanic membrane normal.      Left Ear: Hearing and tympanic membrane normal.      Nose: Congestion present.      Right Sinus: Maxillary sinus tenderness present.      Left Sinus: Maxillary sinus tenderness present.      Mouth/Throat:      Mouth: Mucous membranes are moist.      Pharynx: Postnasal drip present. No oropharyngeal exudate or posterior oropharyngeal erythema.      Tonsils: No tonsillar exudate.     Eyes: Conjunctivae and EOM are normal. Pupils are equal, round, and reactive to light. No scleral icterus. Neck:      Thyroid: No thyroid mass or thyromegaly.       Vascular: No carotid bruit.   Cardiovascular:      Rate and Rhythm: Normal rate and regular rhythm.      Pulses: Normal pulses.      Heart sounds: Normal heart sounds. No murmur heard.  Pulmonary:      Effort: Pulmonary effort is normal. No respiratory distress.      Breath sounds: Normal breath sounds. No wheezing.   Abdominal:      General: Bowel sounds are normal. There is no distension.      Palpations: Abdomen is soft. There is no mass.      Tenderness: There is no abdominal tenderness.   Musculoskeletal:         General: Normal range of motion.      Cervical back: Normal range of motion and neck supple.   Lymphadenopathy:      Cervical: No cervical adenopathy.   Neurological:      Mental Status: She is alert  and oriented to person, place, and time.      Cranial Nerves: No cranial nerve deficit.      Sensory: No sensory deficit.      Gait: Gait normal.      Deep Tendon Reflexes: Reflexes are normal and symmetric.   Skin:     General: Skin is warm and dry.      Findings: No rash.   Psychiatric:         Mood and Affect: Mood normal.         Speech: Speech normal.         Behavior: Behavior normal.   Vitals and nursing note reviewed.     Urgent Care Course   There were no labs reviewed with this patient during the visit.    There were no x-rays reviewed with this patient during the visit.      Procedures   Procedures     Assessment / Plan     Differential Diagnoses including but not limited to: viral URI, allergic rhinitis, strep throat, COVID-19, sinus infection, mono, viral pharyngitis,OM/OE, pneumonia     VSS, afebrile, nontachy, appears to be in no acute distress, lung sounds clear, abdomen soft and nontender palpation.  TM's pearly gray, oropharynx nonerythematous and not exudative.  Is nontoxic-appearing.    Clinical concern for pneumonia is low.    We will treat patient for acute sinusitis per IDSA guidelines with course of doxy. Will advise antihistamines and nasal decongestants for additional relief.      Strict ER precautions discussed.  Advised follow up with PCP.  Indicated understanding and agreed to treatment plan.        Victoria Fields was seen today for sinus problem.    Diagnoses and all orders for this visit:    Acute maxillary sinusitis, recurrence not specified    Other orders  -     doxycycline  (VIBRA -TABS) 100 MG tablet; Take 1 tablet (100 mg) by mouth 2 (two) times daily for 7 days         The indications for early follow-up with PCP and return to UC were discussed. Patient/family received education on the working diagnosis, diagnostic uncertainties, and proposed treatment plan. Indications for emergency evaluation in the ED were reviewed. Written and verbal discharge instructions were provided and discussed and all questions from the patient/family were addressed, with no apparent barriers.

## 2024-07-30 ENCOUNTER — Encounter (INDEPENDENT_AMBULATORY_CARE_PROVIDER_SITE_OTHER): Payer: Self-pay

## 2024-08-12 ENCOUNTER — Ambulatory Visit (INDEPENDENT_AMBULATORY_CARE_PROVIDER_SITE_OTHER): Payer: BLUE CROSS/BLUE SHIELD

## 2024-08-12 DIAGNOSIS — M9908 Segmental and somatic dysfunction of rib cage: Secondary | ICD-10-CM

## 2024-08-12 DIAGNOSIS — M9909 Segmental and somatic dysfunction of abdomen and other regions: Secondary | ICD-10-CM

## 2024-08-12 DIAGNOSIS — M549 Dorsalgia, unspecified: Secondary | ICD-10-CM

## 2024-08-12 DIAGNOSIS — G8929 Other chronic pain: Secondary | ICD-10-CM

## 2024-08-12 DIAGNOSIS — M9901 Segmental and somatic dysfunction of cervical region: Secondary | ICD-10-CM

## 2024-08-12 NOTE — Progress Notes (Addendum)
 [facility]  OFFICE VISIT  [facility]          Date of Exam: 08/12/2024 1:58 PM      Patient ID: Victoria Fields is a 73 y.o. female.  Attending Physician: Rocky CHRISTELLA Moats, DO      Chief Complaint:   Chief Complaint   Patient presents with    Back Pain             HPI:     History of Present Illness  Victoria Fields is a 73 year old female with chronic back pain who presents for an OMM procedure for her low back.    She has chronic lower and upper back pain. Her lower back pain is associated with nerve impingement, which has responded well to steroid injections and is currently manageable.    Her upper back pain worsens with prolonged sitting or standing and radiates from between her shoulder blades. Leaning back or using a recliner relieves symptoms. She feels a sense of compression and notes a loss of height from 5'10 to 5'8. She also has a focal nerve irritation under her shoulder blade that causes localized itching or pain.    She stays active with daily Pilates and stretches two to three times per day, focusing on her hips. 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    After discussion of risks/benefits, decided to proceed with OMT.     Problem List:   Problem List[1]     Current Meds:   Medications Taking[2]      Allergies:   Allergies[3]         Past Surgical History:   Past Surgical History[4]       Family History:   Family History[5]       Social History:   Social History[6]       The following sections were reviewed this encounter by the provider:           Vital Signs:   There were no vitals taken for this visit.        ROS:   Review of Systems   Denies fevers, chills  Denies unintentional weight loss, night sweats  Denies saddle anesthesia or inability to control bowel or bladder, numbness, tingling, weakness    No h/o rheumatoid arthritis, current fractures or infection, cancer, severe osteoporosis          Physical Exam:   PEx:   General: well-nourished, well-developed, in NAD, well appearing   Lungs: breathing comfortably on room air, non-labored breathing, able to complete full sentences without issue   HENT: clear conjunctiva, EOMI   Neuro: awake, alert and oriented x3  Psych: clean, well groomed, clear speech, normal volume, normal eye contact, cooperative and friendly, logical and goal directed thought process    OMM:   Normal gait    Gross motion testing:   FROM with flexion/extension, SB and rotation of the C-spine   FROM with flexion extension of lumbar spine, SB and rotation of T spine     Spinal tenderness: none  Paraspinal tenderness: see below     Physical Exam  MEASUREMENTS: Height- 5'8.  MUSCULOSKELETAL: Increased muscle tension between shoulder blades, worse on right. Tenderness to palpation on right side of mid back. Ribs 9-11 with inhalation dysfunction and tenderness to palpation. Increased tension on right side, more pronounced than left.    Treatments:   Soft Tissue massaging  and kneading of the right paraspinal muscles with direct technique   Counterstrain of multiple points in the right paraspinal region   MFR-- parallel traction and direct inhibitory pressure  Direct Muscle energy of multiple ribs with respiratory assistance  Results          Assessment/Plan:   1. Somatic dysfunction of back    2. Somatic dysfunction of rib cage region    3. Cervical (neck) region somatic dysfunction    Reviewed the risks and benefits of osteopathic manipulative treatment (risks including acute worsening pain) and discussed assessment and treatment goals of each technique utilized in clinic today including: myofascial release and muscle energy.     Diagnosis and Treatment:  Patient tolerated well with improvement in somatic dysfunction and/or symptomatic relief.  Patient tolerated the procedure well.  Reviewed with patient the potential side effects including acute worsening of pain in the 24-48 hours  after manipulative therapy. This can be treated with gentle stretches.  Patient expressed understanding of treatment goals including improved body mechanics and proper musculoskeletal alignment.    Assessment & Plan  Upper back pain with muscle tension and rib dysfunction  Chronic upper back pain with muscle tension and rib dysfunction, more pronounced on the right side. Rib dysfunction identified from the ninth through eleventh ribs with inhalation. Improvement in muscle tension after treatment.  - Continue stretching exercises for upper back and shoulders.  - Use Tylenol as needed for pain.  - Apply heat if pain persists after traveling.  - Ensure adequate hydration.    Low back pain with lumbar radiculopathy, status post steroid injection  Chronic low back pain with lumbar radiculopathy, previously managed with steroid injections. Recent injection provided significant relief.  - Continue regular stretching exercises for the hips.    Patient Instructions:   Patient Instructions   You were seen today for osteopathic manipulation.   Soft tissue release and muscle energy performed.  We reviewed the potential side effects of acute worsening in the 24-48 hours after manipulative therapy which can be treated with gentle stretches, Bengay, IcyHot.   Be sure to stay well hydrated post procedure.  We talked about the benefits of stretching.   Please follow up at desired interval.        Follow-up:   Return for OMT.      This encounter took at total of 40 minutes which include pre-charting, face to face, reviewing records, discussion and coordination of care and minus any separately reportable procedures.     Rocky CHRISTELLA Moats, DO             [1]   Patient Active Problem List  Diagnosis    Allergic rhinitis    Barrett's esophagus    Alport syndrome-like hereditary nephritis    Benign essential hypertension    Benign neoplasm of thyroid gland    Hyperlipidemia    Hyperuricemia    Hypothyroidism    Impaired fasting glucose     Kidney stone    Obesity with body mass index 30 or greater    Obstructive sleep apnea syndrome    Osteopenia    Reactive airway disease    Mitral valve regurgitation    Urine test positive for microalbuminuria    Screening for malignant neoplasm of breast    Fatty liver    Vitamin B12 deficiency    Polyneuropathy associated with underlying disease    Other migraine without status migrainosus, not intractable    Lumbar radiculopathy  Stage 3b chronic kidney disease (CMS/HCC)    Postural kyphosis of thoracic region    Thoracogenic scoliosis of thoracic region   [2]   No outpatient medications have been marked as taking for the 08/12/24 encounter (Procedure visit) with Nicholaus Rocky HERO, DO.   [3]   Allergies  Allergen Reactions    Cephalosporins Anaphylaxis    Strawberry Extract Hives    Cefuroxime     Dust Mite Extract     Molds & Smuts     Niacin Itching    Pollen Extract     Tree Extract    [4]   Past Surgical History:  Procedure Laterality Date    CATARACT EXTRACTION  11/16/2008    COLONOSCOPY, DIAGNOSTIC (SCREENING)  02/17/2015    COLONOSCOPY, DIAGNOSTIC (SCREENING)  08/08/2007    DILATION AND CURETTAGE OF UTERUS      EGD, COLONOSCOPY N/A 04/21/2020    Procedure: EGD, COLONOSCOPY;  Surgeon: Lisa Vinie SAUNDERS, MD;  Location: [facility];  Service: Gastroenterology;  Laterality: N/A;  EGD, COLONOSCOPY  Asst=N    EYE SURGERY Bilateral     cataract    TONSILECTOMY, ADENOIDECTOMY, BILATERAL MYRINGOTOMY AND TUBES      UPPER GASTROINTESTINAL ENDOSCOPY  04/24/2018   [5]   Family History  Problem Relation Name Age of Onset    Heart failure Mother      Osteoporosis Mother      Thyroid disease Mother      Hyperlipidemia Mother      Heart failure Father      Myocardial Infarction Father      Coronary artery disease Father      Stent Father      Hyperlipidemia Father      Myocardial Infarction Maternal Grandfather      Myocardial Infarction Paternal Grandfather  3    Thyroid disease Sister      Hyperlipidemia Sister       Hyperlipidemia Brother      Sudden death Brother          cardiac    Kidney disease Brother      Asthma Brother      Thyroid disease Daughter      Hyperlipidemia Daughter     [6]   Social History  Tobacco Use    Smoking status: Never    Smokeless tobacco: Never   Vaping Use    Vaping status: Never Used   Substance Use Topics    Alcohol use: Yes     Alcohol/week: 0.0 - 1.0 standard drinks of alcohol     Comment: rare    Drug use: Never

## 2024-08-12 NOTE — Patient Instructions (Signed)
You were seen today for osteopathic manipulation.   Soft tissue release and muscle energy performed.  We reviewed the potential side effects of acute worsening in the 24-48 hours after manipulative therapy which can be treated with gentle stretches, Bengay, IcyHot.   Be sure to stay well hydrated post procedure.  We talked about the benefits of stretching.  Please follow up at desired interval.

## 2024-08-31 ENCOUNTER — Other Ambulatory Visit (INDEPENDENT_AMBULATORY_CARE_PROVIDER_SITE_OTHER): Payer: Self-pay

## 2024-08-31 DIAGNOSIS — J3089 Other allergic rhinitis: Secondary | ICD-10-CM

## 2024-09-03 ENCOUNTER — Ambulatory Visit (INDEPENDENT_AMBULATORY_CARE_PROVIDER_SITE_OTHER): Payer: BLUE CROSS/BLUE SHIELD

## 2024-09-03 VITALS — BP 130/67 | HR 80 | Temp 100.5°F | Wt 177.4 lb

## 2024-09-03 DIAGNOSIS — M9908 Segmental and somatic dysfunction of rib cage: Secondary | ICD-10-CM

## 2024-09-03 DIAGNOSIS — M9904 Segmental and somatic dysfunction of sacral region: Secondary | ICD-10-CM

## 2024-09-03 DIAGNOSIS — J018 Other acute sinusitis: Secondary | ICD-10-CM

## 2024-09-03 DIAGNOSIS — M9905 Segmental and somatic dysfunction of pelvic region: Secondary | ICD-10-CM

## 2024-09-03 DIAGNOSIS — M9906 Segmental and somatic dysfunction of lower extremity: Secondary | ICD-10-CM

## 2024-09-03 MED ORDER — DOXYCYCLINE HYCLATE 100 MG PO CAPS: 100.0000 mg | ORAL_CAPSULE | Freq: Two times a day (BID) | ORAL | 0 refills | Status: AC

## 2024-09-03 NOTE — Patient Instructions (Signed)
You were seen today for osteopathic manipulation.   Soft tissue release and muscle energy performed.  We reviewed the potential side effects of acute worsening in the 24-48 hours after manipulative therapy which can be treated with gentle stretches, Bengay, IcyHot.   Be sure to stay well hydrated post procedure.  We talked about the benefits of stretching.  Please follow up at desired interval.

## 2024-09-03 NOTE — Progress Notes (Addendum)
 [facility]  OFFICE VISIT  [facility]          Date of Exam: 09/03/2024 11:39 AM      Patient ID: Victoria Fields is a 73 y.o. female.  Attending Physician: Rocky CHRISTELLA Moats, DO      Chief Complaint:   Chief Complaint   Patient presents with    Back Pain             HPI:     History of Present Illness  Victoria Fields is a 73 year old female who presents for OMT and also having a recent cold with vertigo and postnasal drip.    She has had symptoms for about a week after developing a cold on a cruise. Cold symptoms have improved, but she continues to have vertigo and postnasal drip. The vertigo feels like prior episodes years ago that spontaneously resolved. She has no fever. Postnasal drip causes intermittent throat phlegmy sensation without productive cough.    She uses Mucinex, which helps her sleep, and takes daily Flonase . She is concerned the vertigo may be related to her ears given Alport's disease affecting hearing. She previously used low dose Valium for vertigo with good relief.    She has upper back pain and left hip joint discomfort that started during the trip, with a steroid injection to the hip about five weeks ago. She also has nocturnal leg cramps.    After discussion of risks/benefits, decided to proceed with OMT.     Problem List:   Problem List[1]     Current Meds:   Medications Taking[2]      Allergies:   Allergies[3]         Past Surgical History:   Past Surgical History[4]       Family History:   Family History[5]       Social History:   Social History[6]       The following sections were reviewed this encounter by the provider:           Vital Signs:   BP 130/67   Pulse 80   Temp 100.5 F (38.1 C)   Wt 80.5 kg (177 lb 6.4 oz)   BMI 26.97 kg/m         ROS:   Review of Systems   Denies fevers, chills  Denies unintentional weight loss, night sweats  Denies saddle anesthesia or inability to control bowel or bladder, numbness, tingling,  weakness    No h/o rheumatoid arthritis, current fractures or infection, cancer, severe osteoporosis         Physical Exam:   PEx:   General: well-nourished, well-developed, in NAD, well appearing   Lungs: breathing comfortably on room air, non-labored breathing, able to complete full sentences without issue   HENT: clear conjunctiva, EOMI   Neuro: awake, alert and oriented x3  Psych: clean, well groomed, clear speech, normal volume, normal eye contact, cooperative and friendly, logical and goal directed thought process  OMM:   Normal gait    Gross motion testing:   FROM with flexion/extension, SB and rotation of the C-spine   FROM with flexion extension of lumbar spine, SB and rotation of T spine     Spinal tenderness: none  Paraspinal tenderness: noted     Physical Exam  HEENT: Ears normal. Nasal mucosa swollen and runny.  CHEST: Lungs clear to auscultation bilaterally.  CARDIOVASCULAR: 1/6 systolic heart murmur previously noted.  MUSCULOSKELETAL: Increased hypertonicity of right  paraspinal muscles with rib dysfunction at rib four. Hypertonicity of left IT band, worse in proximal region. Hypertonicity of left piriformis muscle.     Treatments:   Soft Tissue  MFR-- parallel traction and kneading and direct inhibitory pressure  Direct Muscle energy  Results  Osteopathic Manipulative Treatment (OMT) - Myofascial Release and Still Technique, Right Paraspinal Muscles and Rib  Myofascial release applied to right paraspinal muscles with reduction in hypertonicity. Still technique performed to dysfunctional rib with improvement in alignment and tenderness.    Osteopathic Manipulative Treatment (OMT) - Counterstrain and Myofascial Release, Left Hip and IT Band  Counterstrain applied to multiple tender points along left IT band and piriformis with reduction in pain and tightness. Myofascial release performed to left piriformis with improvement in muscle tone.        Assessment/Plan:   1. Acute non-recurrent sinusitis of  other sinus  - doxycycline  (VIBRAMYCIN ) 100 MG capsule; Take 1 capsule (100 mg) by mouth 2 (two) times daily for 7 days  Dispense: 14 capsule; Refill: 0    2. Somatic dysfunction of rib    3. Somatic dysfunction of hip region    4. Segmental and somatic dysfunction of sacral region    5. Somatic dysfunction of right lower extremity    6. Somatic dysfunction of pelvis region    Reviewed the risks and benefits of osteopathic manipulative treatment (risks including acute worsening pain) and discussed assessment and treatment goals of each technique utilized in clinic today including: myofascial release and muscle energy.     Diagnosis and Treatment:  Patient tolerated well with improvement in somatic dysfunction and/or symptomatic relief.  Patient tolerated the procedure well.  Reviewed with patient the potential side effects including acute worsening of pain in the 24-48 hours after manipulative therapy. This can be treated with gentle stretches.  Patient expressed understanding of treatment goals including improved body mechanics and proper musculoskeletal alignment.    Assessment & Plan  Acute sinusitis  Postnasal drip and vertigo, likely viral. Vertigo possibly due to sinus congestion. Symptoms improving, vertigo persists.  - Continue sinus and saline rinses.  - Continue Mucinex.  - Increase Flonase  to two sprays each nostril twice daily, directed towards ear.  - If vertigo persists post-sinus resolution, return for evaluation.  - Prescribed doxycycline  to fill on Saturday if no improvement.    Somatic dysfunction of rib cage and paraspinal muscles  Increased hypertonicity of right paraspinal muscles with rib dysfunction at rib four. Soreness and discomfort on right side.  - Treated right paraspinal muscles with needling myofascial release.  - Treated rib with Still technique.    Somatic dysfunction of left hip and IT band  Hypertonicity of left IT band, worse proximally. Pain in joint area, possibly related to IT  band tightness. Symptoms improved with treatment.  - Treated left IT band with multiple counter strain points.  - Treated piriformis with counter strain and myofascial release.  - Encouraged IT band stretching exercises.    Patient Instructions:   Patient Instructions   You were seen today for osteopathic manipulation.   Soft tissue release and muscle energy performed.  We reviewed the potential side effects of acute worsening in the 24-48 hours after manipulative therapy which can be treated with gentle stretches, Bengay, IcyHot.   Be sure to stay well hydrated post procedure.  We talked about the benefits of stretching.   Please follow up at desired interval.        Follow-up:   No  follow-ups on file.      This encounter took at total of 45 minutes which include pre-charting, face to face, reviewing records, discussion and coordination of care and minus any separately reportable procedures.     Rocky CHRISTELLA Moats, DO             [1]   Patient Active Problem List  Diagnosis    Allergic rhinitis    Barrett's esophagus    Alport syndrome-like hereditary nephritis    Benign essential hypertension    Benign neoplasm of thyroid gland    Hyperlipidemia    Hyperuricemia    Hypothyroidism    Impaired fasting glucose    Kidney stone    Obesity with body mass index 30 or greater    Obstructive sleep apnea syndrome    Osteopenia    Reactive airway disease    Mitral valve regurgitation    Urine test positive for microalbuminuria    Screening for malignant neoplasm of breast    Fatty liver    Vitamin B12 deficiency    Polyneuropathy associated with underlying disease    Other migraine without status migrainosus, not intractable    Lumbar radiculopathy    Stage 3b chronic kidney disease (CMS/HCC)    Postural kyphosis of thoracic region    Thoracogenic scoliosis of thoracic region   [2]   No outpatient medications have been marked as taking for the 09/03/24 encounter (Office Visit) with Moats Rocky CHRISTELLA, DO.   [3]   Allergies  Allergen  Reactions    Cephalosporins Anaphylaxis    Strawberry Extract Hives    Cefuroxime     Dust Mite Extract     Molds & Smuts     Niacin Itching    Pollen Extract     Tree Extract    [4]   Past Surgical History:  Procedure Laterality Date    CATARACT EXTRACTION  11/16/2008    COLONOSCOPY, DIAGNOSTIC (SCREENING)  02/17/2015    COLONOSCOPY, DIAGNOSTIC (SCREENING)  08/08/2007    DILATION AND CURETTAGE OF UTERUS      EGD, COLONOSCOPY N/A 04/21/2020    Procedure: EGD, COLONOSCOPY;  Surgeon: Lisa Vinie SAUNDERS, MD;  Location: [facility];  Service: Gastroenterology;  Laterality: N/A;  EGD, COLONOSCOPY  Asst=N    EYE SURGERY Bilateral     cataract    TONSILECTOMY, ADENOIDECTOMY, BILATERAL MYRINGOTOMY AND TUBES      UPPER GASTROINTESTINAL ENDOSCOPY  04/24/2018   [5]   Family History  Problem Relation Name Age of Onset    Heart failure Mother      Osteoporosis Mother      Thyroid disease Mother      Hyperlipidemia Mother      Heart failure Father      Myocardial Infarction Father      Coronary artery disease Father      Stent Father      Hyperlipidemia Father      Myocardial Infarction Maternal Grandfather      Myocardial Infarction Paternal Grandfather  31    Thyroid disease Sister      Hyperlipidemia Sister      Hyperlipidemia Brother      Sudden death Brother          cardiac    Kidney disease Brother      Asthma Brother      Thyroid disease Daughter      Hyperlipidemia Daughter     [6]   Social History  Tobacco Use  Smoking status: Never    Smokeless tobacco: Never   Vaping Use    Vaping status: Never Used   Substance Use Topics    Alcohol use: Yes     Alcohol/week: 0.0 - 1.0 standard drinks of alcohol     Comment: rare    Drug use: Never

## 2024-09-03 NOTE — Progress Notes (Signed)
 Patient seen and discussed with Rocky CHRISTELLA Moats, DO.  I was physically present during the critical portions of the procedure, to include: Treatment plan and Recheck.       Dr. Everitt Clint Kill was immediately available at all other times for assistance during the non-critical portions of the procedure.

## 2024-09-03 NOTE — Addendum Note (Signed)
 Addended by: Delfino Friesen M on: 09/03/2024 11:40 AM     Modules accepted: Level of Service

## 2024-09-07 ENCOUNTER — Ambulatory Visit
Admission: EM | Admit: 2024-09-07 | Discharge: 2024-09-07 | Disposition: A | Discharge status: Home / Self Care | Attending: Family Medicine | Admitting: Family Medicine

## 2024-09-07 ENCOUNTER — Ambulatory Visit: Admitting: Radiology

## 2024-09-07 ENCOUNTER — Ambulatory Visit: Payer: Self-pay | Admitting: Urgent Care

## 2024-09-07 DIAGNOSIS — J4531 Mild persistent asthma with (acute) exacerbation: Secondary | ICD-10-CM | POA: Diagnosis not present

## 2024-09-07 DIAGNOSIS — R0602 Shortness of breath: Secondary | ICD-10-CM | POA: Diagnosis not present

## 2024-09-07 DIAGNOSIS — J209 Acute bronchitis, unspecified: Secondary | ICD-10-CM | POA: Diagnosis not present

## 2024-09-07 HISTORY — DX: Disorder of thyroid, unspecified: E07.9

## 2024-09-07 HISTORY — DX: Unspecified asthma, uncomplicated: J45.909

## 2024-09-07 MED ORDER — PREDNISONE 20 MG PO TABS: ORAL_TABLET | ORAL | 0 refills | Status: AC

## 2024-09-07 NOTE — ED Provider Notes (Signed)
 " [facility]  Note:  This document was prepared using Conservation officer, historic buildings and may include unintentional dictation errors.  MRN: [id number] DOB: October 16, 1950  Subjective:   Courtney Lin is a 73 y.o. female presenting for 1 day history of acute onset coughing, shortness of breath, chest congestion and wheezing.  Has a history of asthma, has not used her inhaler.  Is currently taking doxycycline for sinus infection.  Has had improvement in her sinuses.  However, she is worried about her lower respiratory symptoms now.  No smoking of any kind including cigarettes, cigars, vaping, marijuana use.    Current Outpatient Medications  Medication Instructions   albuterol (VENTOLIN HFA) 108 (90 Base) MCG/ACT inhaler 2 puffs, Every 4 hours PRN   allopurinol (ZYLOPRIM) 200 mg, Daily   B Complex-C (SUPER B COMPLEX PO) Super B Complex   Cholecalciferol 50 MCG (2000 UT) CAPS 2 (two) times daily   doxycycline (VIBRAMYCIN) 100 mg   ezetimibe (ZETIA) 10 mg, Daily   famotidine (PEPCID) 40 mg, Daily at bedtime   lansoprazole (PREVACID) 30 mg, Daily   levocetirizine (XYZAL) 5 mg, Every evening   omega-3 acid ethyl esters (LOVAZA) 1 g capsule 1 capsule, 2 times daily   pregabalin (LYRICA) 50 mg, 3 times daily   rosuvastatin (CRESTOR) 20 mg, Daily   sodium bicarbonate 650 mg, 2 times daily   Synthroid 50 mcg, Every morning    Allergies[1]  Past Medical History:  Diagnosis Date   Asthma    Thyroid disease      Past Surgical History:  Procedure Laterality Date   TONSILLECTOMY     73 y/o    History reviewed. No pertinent family history.  Social History   Occupational History   Not on file  Tobacco Use   Smoking status: Never   Smokeless tobacco: Never  Vaping Use   Vaping status: Never Used  Substance and Sexual Activity   Alcohol use: Never   Drug use: Never   Sexual activity: Not on file     ROS   Objective:   Vitals: BP 137/81 (BP  Location: Right Arm)   Pulse 67   Temp 98.1 F (36.7 C) (Oral)   Resp 16   SpO2 94%   Physical Exam Constitutional:      General: She is not in acute distress.    Appearance: Normal appearance. She is well-developed. She is not ill-appearing, toxic-appearing or diaphoretic.  HENT:     Head: Normocephalic and atraumatic.     Right Ear: External ear normal.     Left Ear: External ear normal.     Nose: Nose normal.     Mouth/Throat:     Mouth: Mucous membranes are moist.  Eyes:     General: No scleral icterus.       Right eye: No discharge.        Left eye: No discharge.     Extraocular Movements: Extraocular movements intact.  Cardiovascular:     Rate and Rhythm: Normal rate and regular rhythm.     Heart sounds: Normal heart sounds. No murmur heard.    No friction rub. No gallop.  Pulmonary:     Effort: Pulmonary effort is normal. No respiratory distress.     Breath sounds: No stridor. Examination of the right-lower field reveals rhonchi. Examination of the left-lower field reveals rhonchi. Rhonchi present. No wheezing or rales.  Chest:     Chest wall: No tenderness.  Skin:  General: Skin is warm and dry.  Neurological:     General: No focal deficit present.     Mental Status: She is alert and oriented to person, place, and time.  Psychiatric:        Mood and Affect: Mood normal.        Behavior: Behavior normal.    DG Chest 2 View Result Date: 09/07/2024 CLINICAL DATA:  Shortness of breath. EXAM: CHEST - 2 VIEW COMPARISON:  None Available. FINDINGS: The cardiomediastinal contours are normal. Bronchial thickening. Subsegmental atelectasis/scarring at the left lung base. Pulmonary vasculature is normal. No consolidation, pleural effusion, or pneumothorax. No acute osseous abnormalities are seen. IMPRESSION: Bronchial thickening. Subsegmental atelectasis/scarring at the left lung base. Electronically Signed   By: Andrea Gasman M.D.   On: 09/07/2024 15:01      Assessment and Plan :   PDMP not reviewed this encounter.  1. Acute bronchitis, unspecified organism   2. Shortness of breath   3. Mild persistent asthma with (acute) exacerbation      Start prednisone  for an acute bronchitis.  Maintain doxycycline.  Start using albuterol inhaler.  Use supportive care otherwise.  Counseled patient on potential for adverse effects with medications prescribed/recommended today, ER and return-to-clinic precautions discussed, patient verbalized understanding.     [1]  Allergies Allergen Reactions   Cephalosporins Anaphylaxis     Christopher Savannah, PA-C 09/07/24 1526  "

## 2024-09-07 NOTE — Discharge Instructions (Addendum)
 Please head over to UC at [facility]. We are pursuing an x-ray to rule out pneumonia. I will let you know about your x-ray results later today. You can start prednisone  as a steroid to help with your breathing in the context of asthma.

## 2024-09-07 NOTE — ED Triage Notes (Addendum)
 Pt currently taking Doxycycline for sinus infection, and sinuses feel much better. Patient reports that she developed a cough within the last few hours and some chest congestion. Pt states that she had wheezing this morning that improved after inhaler. Patient does report that she is not getting enough air in her lungs. Pt denies any headache, sore throat, or fever. NAD. Pt does plan to travel to [facility].

## 2024-10-01 ENCOUNTER — Other Ambulatory Visit (INDEPENDENT_AMBULATORY_CARE_PROVIDER_SITE_OTHER): Payer: Self-pay | Admitting: Family Medicine

## 2024-10-02 ENCOUNTER — Ambulatory Visit (INDEPENDENT_AMBULATORY_CARE_PROVIDER_SITE_OTHER): Payer: Self-pay | Admitting: Family Medicine

## 2024-10-02 LAB — TSH: TSH: 0.948 u[IU]/mL (ref 0.450–4.500)

## 2024-10-02 NOTE — Progress Notes (Signed)
Thyroid is in the normal range.

## 2024-10-16 ENCOUNTER — Encounter (INDEPENDENT_AMBULATORY_CARE_PROVIDER_SITE_OTHER): Payer: Self-pay | Admitting: Family Medicine

## 2024-10-16 NOTE — Progress Notes (Signed)
 PA submitted and approved for [employer]  (Key: [id number]). PA expiration date: October 16, 2025.  Pt made aware via MyChart messages.

## 2024-10-27 ENCOUNTER — Encounter (INDEPENDENT_AMBULATORY_CARE_PROVIDER_SITE_OTHER): Payer: BLUE CROSS/BLUE SHIELD | Admitting: Family Medicine

## 2025-01-05 ENCOUNTER — Ambulatory Visit (INDEPENDENT_AMBULATORY_CARE_PROVIDER_SITE_OTHER): Payer: BLUE CROSS/BLUE SHIELD | Admitting: Family Medicine

## 2025-06-10 ENCOUNTER — Ambulatory Visit (INDEPENDENT_AMBULATORY_CARE_PROVIDER_SITE_OTHER): Payer: BLUE CROSS/BLUE SHIELD | Admitting: Family Medicine
